# Patient Record
Sex: Male | Born: 1950 | Race: Black or African American | Hispanic: No | Smoking: Current some day smoker
Health system: Southern US, Community
[De-identification: ages and names within clinical notes are randomized; demographics above are authoritative.]

## PROBLEM LIST (undated history)

## (undated) DIAGNOSIS — E079 Disorder of thyroid, unspecified: Secondary | ICD-10-CM

## (undated) DIAGNOSIS — F329 Major depressive disorder, single episode, unspecified: Secondary | ICD-10-CM

## (undated) DIAGNOSIS — R200 Anesthesia of skin: Secondary | ICD-10-CM

## (undated) DIAGNOSIS — M549 Dorsalgia, unspecified: Secondary | ICD-10-CM

## (undated) DIAGNOSIS — D171 Benign lipomatous neoplasm of skin and subcutaneous tissue of trunk: Secondary | ICD-10-CM

## (undated) DIAGNOSIS — C801 Malignant (primary) neoplasm, unspecified: Secondary | ICD-10-CM

## (undated) DIAGNOSIS — M1990 Unspecified osteoarthritis, unspecified site: Secondary | ICD-10-CM

## (undated) DIAGNOSIS — J189 Pneumonia, unspecified organism: Secondary | ICD-10-CM

## (undated) DIAGNOSIS — F32A Depression, unspecified: Secondary | ICD-10-CM

## (undated) DIAGNOSIS — R202 Paresthesia of skin: Secondary | ICD-10-CM

## (undated) DIAGNOSIS — J45909 Unspecified asthma, uncomplicated: Secondary | ICD-10-CM

## (undated) DIAGNOSIS — G4700 Insomnia, unspecified: Secondary | ICD-10-CM

## (undated) DIAGNOSIS — R1310 Dysphagia, unspecified: Secondary | ICD-10-CM

## (undated) DIAGNOSIS — I493 Ventricular premature depolarization: Secondary | ICD-10-CM

## (undated) DIAGNOSIS — K219 Gastro-esophageal reflux disease without esophagitis: Secondary | ICD-10-CM

## (undated) DIAGNOSIS — F419 Anxiety disorder, unspecified: Secondary | ICD-10-CM

## (undated) HISTORY — PX: FOOT SURGERY: SHX648

## (undated) HISTORY — DX: Ventricular premature depolarization: I49.3

## (undated) HISTORY — DX: Unspecified asthma, uncomplicated: J45.909

## (undated) HISTORY — DX: Benign lipomatous neoplasm of skin and subcutaneous tissue of trunk: D17.1

## (undated) HISTORY — DX: Unspecified osteoarthritis, unspecified site: M19.90

## (undated) HISTORY — DX: Disorder of thyroid, unspecified: E07.9

## (undated) HISTORY — DX: Dysphagia, unspecified: R13.10

## (undated) HISTORY — DX: Gastro-esophageal reflux disease without esophagitis: K21.9

---

## 1976-05-01 HISTORY — PX: BUNIONECTOMY: SHX129

## 1976-05-01 HISTORY — PX: OTHER SURGICAL HISTORY: SHX169

## 1988-12-30 HISTORY — PX: OTHER SURGICAL HISTORY: SHX169

## 1994-05-01 HISTORY — PX: KNEE SURGERY: SHX244

## 1998-05-01 HISTORY — PX: BACK SURGERY: SHX140

## 2009-09-03 ENCOUNTER — Encounter (INDEPENDENT_AMBULATORY_CARE_PROVIDER_SITE_OTHER): Payer: Self-pay | Admitting: Family Medicine

## 2009-09-03 ENCOUNTER — Ambulatory Visit: Payer: Self-pay | Admitting: Internal Medicine

## 2009-09-03 LAB — CONVERTED CEMR LAB
Basophils Absolute: 0 10*3/uL (ref 0.0–0.1)
Eosinophils Absolute: 0.1 10*3/uL (ref 0.0–0.7)
Hemoglobin: 16.2 g/dL (ref 13.0–17.0)
Lymphocytes Relative: 35 % (ref 12–46)
MCHC: 33.7 g/dL (ref 30.0–36.0)
MCV: 86 fL (ref 78.0–100.0)
Monocytes Absolute: 0.4 10*3/uL (ref 0.1–1.0)
Monocytes Relative: 6 % (ref 3–12)
Neutrophils Relative %: 57 % (ref 43–77)
Platelets: 261 10*3/uL (ref 150–400)
RBC: 5.59 M/uL (ref 4.22–5.81)
Retic Ct Pct: 0.6 % (ref 0.4–3.1)

## 2009-09-04 ENCOUNTER — Encounter (INDEPENDENT_AMBULATORY_CARE_PROVIDER_SITE_OTHER): Payer: Self-pay | Admitting: Family Medicine

## 2009-09-08 ENCOUNTER — Ambulatory Visit (HOSPITAL_COMMUNITY): Admission: RE | Admit: 2009-09-08 | Discharge: 2009-09-08 | Payer: Self-pay | Admitting: Family Medicine

## 2009-09-17 ENCOUNTER — Ambulatory Visit: Payer: Self-pay | Admitting: Internal Medicine

## 2009-09-24 ENCOUNTER — Ambulatory Visit: Payer: Self-pay | Admitting: Internal Medicine

## 2009-10-14 ENCOUNTER — Encounter (INDEPENDENT_AMBULATORY_CARE_PROVIDER_SITE_OTHER): Payer: Self-pay | Admitting: Family Medicine

## 2009-10-14 ENCOUNTER — Ambulatory Visit: Payer: Self-pay | Admitting: Internal Medicine

## 2009-10-14 LAB — CONVERTED CEMR LAB: TSH: 0.187 microintl units/mL — ABNORMAL LOW (ref 0.350–4.500)

## 2009-10-15 ENCOUNTER — Encounter (INDEPENDENT_AMBULATORY_CARE_PROVIDER_SITE_OTHER): Payer: Self-pay | Admitting: Family Medicine

## 2009-10-22 ENCOUNTER — Ambulatory Visit (HOSPITAL_COMMUNITY): Admission: RE | Admit: 2009-10-22 | Discharge: 2009-10-22 | Payer: Self-pay | Admitting: Family Medicine

## 2009-10-22 ENCOUNTER — Ambulatory Visit: Payer: Self-pay | Admitting: Internal Medicine

## 2009-11-02 ENCOUNTER — Ambulatory Visit (HOSPITAL_COMMUNITY): Admission: RE | Admit: 2009-11-02 | Discharge: 2009-11-02 | Payer: Self-pay | Admitting: Family Medicine

## 2009-11-10 ENCOUNTER — Ambulatory Visit (HOSPITAL_COMMUNITY): Admission: RE | Admit: 2009-11-10 | Discharge: 2009-11-10 | Payer: Self-pay | Admitting: Cardiology

## 2009-11-10 ENCOUNTER — Encounter (INDEPENDENT_AMBULATORY_CARE_PROVIDER_SITE_OTHER): Payer: Self-pay | Admitting: Cardiology

## 2009-11-19 ENCOUNTER — Ambulatory Visit: Payer: Self-pay | Admitting: Internal Medicine

## 2009-12-14 ENCOUNTER — Emergency Department (HOSPITAL_COMMUNITY): Admission: EM | Admit: 2009-12-14 | Discharge: 2009-12-14 | Payer: Self-pay | Admitting: Emergency Medicine

## 2009-12-15 ENCOUNTER — Ambulatory Visit: Payer: Self-pay | Admitting: Internal Medicine

## 2010-01-17 ENCOUNTER — Encounter (INDEPENDENT_AMBULATORY_CARE_PROVIDER_SITE_OTHER): Payer: Self-pay | Admitting: Family Medicine

## 2010-01-17 LAB — CONVERTED CEMR LAB
PSA: 1.5 ng/mL (ref 0.10–4.00)
TSH: 0.709 microintl units/mL (ref 0.350–4.500)

## 2010-02-28 ENCOUNTER — Ambulatory Visit: Payer: Self-pay | Admitting: Internal Medicine

## 2010-05-10 ENCOUNTER — Encounter (INDEPENDENT_AMBULATORY_CARE_PROVIDER_SITE_OTHER): Payer: Self-pay | Admitting: *Deleted

## 2010-05-10 LAB — CONVERTED CEMR LAB: T3 Uptake Ratio: 29.5 % (ref 22.5–37.0)

## 2010-05-22 ENCOUNTER — Encounter: Payer: Self-pay | Admitting: Family Medicine

## 2010-07-15 LAB — DIFFERENTIAL
Basophils Absolute: 0 10*3/uL (ref 0.0–0.1)
Basophils Relative: 0 % (ref 0–1)
Eosinophils Absolute: 0.1 10*3/uL (ref 0.0–0.7)
Eosinophils Relative: 1 % (ref 0–5)
Lymphs Abs: 1.6 10*3/uL (ref 0.7–4.0)
Monocytes Absolute: 0.6 10*3/uL (ref 0.1–1.0)
Monocytes Relative: 10 % (ref 3–12)
Neutro Abs: 3.6 10*3/uL (ref 1.7–7.7)
Neutrophils Relative %: 61 % (ref 43–77)

## 2010-07-15 LAB — CBC
MCH: 29 pg (ref 26.0–34.0)
MCHC: 33.6 g/dL (ref 30.0–36.0)
Platelets: 227 10*3/uL (ref 150–400)
RDW: 14.3 % (ref 11.5–15.5)

## 2010-07-15 LAB — COMPREHENSIVE METABOLIC PANEL
Alkaline Phosphatase: 79 U/L (ref 39–117)
BUN: 11 mg/dL (ref 6–23)
Creatinine, Ser: 0.87 mg/dL (ref 0.4–1.5)
GFR calc Af Amer: >60 mL/min (ref >60)
GFR calc non Af Amer: >60 mL/min (ref >60)
Potassium: 3.5 mEq/L (ref 3.5–5.1)
Sodium: 137 mEq/L (ref 135–145)
Total Bilirubin: 0.5 mg/dL (ref 0.3–1.2)

## 2010-07-15 LAB — PROTIME-INR: Prothrombin Time: 14.1 seconds (ref 11.6–15.2)

## 2011-05-02 HISTORY — PX: TOE SURGERY: SHX1073

## 2011-09-05 ENCOUNTER — Encounter (HOSPITAL_COMMUNITY): Payer: Self-pay | Admitting: *Deleted

## 2011-09-05 ENCOUNTER — Emergency Department (HOSPITAL_COMMUNITY)
Admission: EM | Admit: 2011-09-05 | Discharge: 2011-09-05 | Disposition: A | Discharge status: Home / Self Care | Payer: Medicaid Other | Attending: Emergency Medicine | Admitting: Emergency Medicine

## 2011-09-05 DIAGNOSIS — R112 Nausea with vomiting, unspecified: Secondary | ICD-10-CM | POA: Insufficient documentation

## 2011-09-05 DIAGNOSIS — R109 Unspecified abdominal pain: Secondary | ICD-10-CM | POA: Insufficient documentation

## 2011-09-05 DIAGNOSIS — T507X5A Adverse effect of analeptics and opioid receptor antagonists, initial encounter: Secondary | ICD-10-CM | POA: Insufficient documentation

## 2011-09-05 DIAGNOSIS — T887XXA Unspecified adverse effect of drug or medicament, initial encounter: Secondary | ICD-10-CM

## 2011-09-05 LAB — URINALYSIS, ROUTINE W REFLEX MICROSCOPIC
Hgb urine dipstick: NEGATIVE
Ketones, ur: NEGATIVE mg/dL
Leukocytes, UA: NEGATIVE
Protein, ur: NEGATIVE mg/dL
Urobilinogen, UA: 0.2 mg/dL (ref 0.0–1.0)

## 2011-09-05 LAB — DIFFERENTIAL
Basophils Relative: 0 % (ref 0–1)
Eosinophils Relative: 0 % (ref 0–5)
Lymphs Abs: 1.9 10*3/uL (ref 0.7–4.0)
Neutro Abs: 8.7 10*3/uL — ABNORMAL HIGH (ref 1.7–7.7)
Neutrophils Relative %: 76 % (ref 43–77)

## 2011-09-05 LAB — BASIC METABOLIC PANEL
CO2: 26 mEq/L (ref 19–32)
Calcium: 9.1 mg/dL (ref 8.4–10.5)
Chloride: 101 mEq/L (ref 96–112)
Creatinine, Ser: 0.82 mg/dL (ref 0.50–1.35)
Potassium: 3.7 mEq/L (ref 3.5–5.1)

## 2011-09-05 LAB — CBC
HCT: 47.2 % (ref 39.0–52.0)
RBC: 5.37 MIL/uL (ref 4.22–5.81)
RDW: 15.1 % (ref 11.5–15.5)

## 2011-09-05 MED ORDER — ONDANSETRON 8 MG PO TBDP
ORAL_TABLET | ORAL | Status: AC
  Filled 2011-09-05: qty 1

## 2011-09-05 MED ORDER — ONDANSETRON 8 MG PO TBDP
8.0000 mg | ORAL_TABLET | Freq: Once | ORAL | Status: AC
  Administered 2011-09-05: 8 mg via ORAL

## 2011-09-05 MED ORDER — OXYCODONE-ACETAMINOPHEN 5-325 MG PO TABS: 1.0000 | ORAL_TABLET | ORAL | Status: AC | PRN

## 2011-09-05 NOTE — Discharge Instructions (Signed)
STOP TAKING SUBOXONE UNTIL SEEN BY YOUR DOCTOR. CALL HIS/HER OFFICE THIS MORNING TO SCHEDULE A TIME TO BE SEEN. TAKE PERCOCET IF NEEDED FOR BACK PAIN. IF ABDOMINAL PAIN WORSENS, YOU DEVELOP A FEVER, HAVE UNCONTROLLED VOMITING OR NEW CONCERN, RETURN HERE FOR FURTHER EVALUATION.   Abdominal Pain Abdominal pain can be caused by many things. Your caregiver decides the seriousness of your pain by an examination and possibly blood tests and X-rays. Many cases can be observed and treated at home. Most abdominal pain is not caused by a disease and will probably improve without treatment. However, in many cases, more time must pass before a clear cause of the pain can be found. Before that point, it may not be known if you need more testing, or if hospitalization or surgery is needed. HOME CARE INSTRUCTIONS   Do not take laxatives unless directed by your caregiver.   Take pain medicine only as directed by your caregiver.   Only take over-the-counter or prescription medicines for pain, discomfort, or fever as directed by your caregiver.   Try a clear liquid diet (broth, tea, or water) for as long as directed by your caregiver. Slowly move to a bland diet as tolerated.  SEEK IMMEDIATE MEDICAL CARE IF:   The pain does not go away.   You have a fever.   You keep throwing up (vomiting).   The pain is felt only in portions of the abdomen. Pain in the right side could possibly be appendicitis. In an adult, pain in the left lower portion of the abdomen could be colitis or diverticulitis.   You pass bloody or black tarry stools.  MAKE SURE YOU:   Understand these instructions.   Will watch your condition.   Will get help right away if you are not doing well or get worse.  Document Released: 01/25/2005 Document Revised: 04/06/2011 Document Reviewed: 12/04/2007 [facility] Patient Information 2012 [place], [place].

## 2011-09-05 NOTE — ED Provider Notes (Signed)
Medical screening examination/treatment/procedure(s) were performed by non-physician practitioner and as supervising physician I was immediately available for consultation/collaboration.  Flint Melter, MD 09/05/11 Ebony Cargo

## 2011-09-05 NOTE — ED Notes (Signed)
Pt notified that we need a urine sample

## 2011-09-05 NOTE — ED Notes (Signed)
UJW:[id number]<YN> Expected date:09/05/11<BR> Expected time:<BR> Means of arrival:Ambulance<BR> Comments:<BR> Vomiting

## 2011-09-05 NOTE — ED Notes (Signed)
Per EMS:  Pt has chronic back pain and has been tramadol for 2 years and was taken off recently and put on suboxone.  Pt st's he took suboxone around 0000 and started vomiting around 1.5 hours ago off and on, pt was dry heaving when EMS arrived.  Pt st's on the way here he no longer felt nauseous and could feel the effect of the drug (suboxone).  Pt has no other complaints, alert and oriented.  Pt ambulatory on scene.

## 2011-09-05 NOTE — ED Provider Notes (Signed)
History     CSN: [id number]  Arrival date & time 09/05/11  0536   First MD Initiated Contact with Patient 09/05/11 [phone]      Chief Complaint  Patient presents with  . Emesis    (Consider location/radiation/quality/duration/timing/severity/associated sxs/prior treatment) Patient is a 61 y.o. male presenting with vomiting. The history is provided by the patient.  Emesis  This is a new problem. The current episode started 6 to 12 hours ago. The problem occurs 2 to 4 times per day. The problem has been gradually improving. There has been no fever. Associated symptoms include abdominal pain. Pertinent negatives include no chills, no diarrhea and no fever. Associated symptoms comments: He started a new medication last night and began vomiting shortly after taking suboxone. He had been treated for chronic back pain with tramadol that was no longer providing relief and is managed through the Pain Management clinic. He is complaining also of right sided abdominal pain that is different from his usual pain. Marland Kitchen    History reviewed. No pertinent past medical history.  Past Surgical History  Procedure Date  . Back surgery     No family history on file.  History  Substance Use Topics  . Smoking status: Not on file  . Smokeless tobacco: Not on file  . Alcohol Use:       Review of Systems  Constitutional: Negative for fever and chills.  HENT: Negative.   Respiratory: Negative.  Negative for shortness of breath.   Cardiovascular: Negative.  Negative for chest pain.  Gastrointestinal: Positive for nausea, vomiting and abdominal pain. Negative for diarrhea.  Musculoskeletal: Negative for back pain.  Skin: Negative.   Neurological: Negative.     Allergies  Vancomycin  Home Medications   Current Outpatient Rx  Name Route Sig Dispense Refill  . BUPRENORPHINE HCL-NALOXONE HCL 8-2 MG SL SUBL Sublingual Place 1 tablet under the tongue daily.    Marland Kitchen CITALOPRAM HYDROBROMIDE 20 MG PO TABS  Oral Take 20 mg by mouth daily.    Marland Kitchen ESOMEPRAZOLE MAGNESIUM 40 MG PO CPDR Oral Take 40 mg by mouth daily before breakfast.    . NAPROXEN 500 MG PO TABS Oral Take 500 mg by mouth 2 (two) times daily with a meal.    . SEROQUEL PO Oral Take by mouth at bedtime.      BP 135/85  Pulse 69  Temp(Src) 98.7 F (37.1 C) (Oral)  Resp 18  SpO2 96%  Physical Exam  Constitutional: He appears well-developed and well-nourished.  HENT:  Head: Normocephalic.  Neck: Normal range of motion. Neck supple.  Cardiovascular: Normal rate and regular rhythm.   Pulmonary/Chest: Effort normal and breath sounds normal.  Abdominal: Soft. Bowel sounds are normal. There is no rebound and no guarding.       Right mid and right lower abdominal tenderness without rebound or guarding.   Musculoskeletal: Normal range of motion.  Neurological: He is alert. No cranial nerve deficit.  Skin: Skin is warm and dry. No rash noted.  Psychiatric: He has a normal mood and affect.    ED Course  Procedures (including critical care time)  Labs Reviewed  CBC - Abnormal; Notable for the following:    WBC 11.3 (*)    All other components within normal limits  DIFFERENTIAL - Abnormal; Notable for the following:    Neutro Abs 8.7 (*)    All other components within normal limits  BASIC METABOLIC PANEL - Abnormal; Notable for the following:  Glucose, Bld 131 (*)    BUN 26 (*)    All other components within normal limits  URINALYSIS, ROUTINE W REFLEX MICROSCOPIC   No results found. Results for orders placed during the hospital encounter of 09/05/11  CBC      Component Value Range   WBC 11.3 (*) 4.0 - 10.5 (K/uL)   RBC 5.37  4.22 - 5.81 (MIL/uL)   Hemoglobin 15.9  13.0 - 17.0 (g/dL)   HCT 16.1  09.6 - 04.5 (%)   MCV 87.9  78.0 - 100.0 (fL)   MCH 29.6  26.0 - 34.0 (pg)   MCHC 33.7  30.0 - 36.0 (g/dL)   RDW 40.9  81.1 - 91.4 (%)   Platelets 214  150 - 400 (K/uL)  DIFFERENTIAL      Component Value Range   Neutrophils  Relative 76  43 - 77 (%)   Neutro Abs 8.7 (*) 1.7 - 7.7 (K/uL)   Lymphocytes Relative 16  12 - 46 (%)   Lymphs Abs 1.9  0.7 - 4.0 (K/uL)   Monocytes Relative 7  3 - 12 (%)   Monocytes Absolute 0.8  0.1 - 1.0 (K/uL)   Eosinophils Relative 0  0 - 5 (%)   Eosinophils Absolute 0.0  0.0 - 0.7 (K/uL)   Basophils Relative 0  0 - 1 (%)   Basophils Absolute 0.0  0.0 - 0.1 (K/uL)  BASIC METABOLIC PANEL      Component Value Range   Sodium 137  135 - 145 (mEq/L)   Potassium 3.7  3.5 - 5.1 (mEq/L)   Chloride 101  96 - 112 (mEq/L)   CO2 26  19 - 32 (mEq/L)   Glucose, Bld 131 (*) 70 - 99 (mg/dL)   BUN 26 (*) 6 - 23 (mg/dL)   Creatinine, Ser 7.82  0.50 - 1.35 (mg/dL)   Calcium 9.1  8.4 - 95.6 (mg/dL)   GFR calc non Af Amer >90  >90 (mL/min)   GFR calc Af Amer >90  >90 (mL/min)  URINALYSIS, ROUTINE W REFLEX MICROSCOPIC      Component Value Range   Color, Urine YELLOW  YELLOW    APPearance CLEAR  CLEAR    Specific Gravity, Urine 1.027  1.005 - 1.030    pH 6.0  5.0 - 8.0    Glucose, UA NEGATIVE  NEGATIVE (mg/dL)   Hgb urine dipstick NEGATIVE  NEGATIVE    Bilirubin Urine NEGATIVE  NEGATIVE    Ketones, ur NEGATIVE  NEGATIVE (mg/dL)   Protein, ur NEGATIVE  NEGATIVE (mg/dL)   Urobilinogen, UA 0.2  0.0 - 1.0 (mg/dL)   Nitrite NEGATIVE  NEGATIVE    Leukocytes, UA NEGATIVE  NEGATIVE      No diagnosis found. 1. Adverse medication reaction - vomiting 2. Abdominal pain    MDM  Re-evaluation of abdomen: He is still mildly tender to right abdomen. No further vomiting. No fever, VSS. Minimal elevation of WBC without shift. Doubt this is appendicitis as vomiting seems to be related to suboxone, patient has a persistently good appetite, no fever. Discomfort on exam is not changing. Feel he can be discharged home and recheck with any worsening symptoms in 12-24 hours. Discussed with Dr. Read Drivers who agrees with care plan.        Rodena Medin, PA-C 09/05/11 [phone]

## 2011-10-16 ENCOUNTER — Ambulatory Visit: Payer: Medicaid Other | Attending: Anesthesiology

## 2011-10-16 DIAGNOSIS — M6281 Muscle weakness (generalized): Secondary | ICD-10-CM | POA: Insufficient documentation

## 2011-10-16 DIAGNOSIS — M2560 Stiffness of unspecified joint, not elsewhere classified: Secondary | ICD-10-CM | POA: Insufficient documentation

## 2011-10-16 DIAGNOSIS — IMO0001 Reserved for inherently not codable concepts without codable children: Secondary | ICD-10-CM | POA: Insufficient documentation

## 2011-10-16 DIAGNOSIS — R5381 Other malaise: Secondary | ICD-10-CM | POA: Insufficient documentation

## 2011-10-16 DIAGNOSIS — M2550 Pain in unspecified joint: Secondary | ICD-10-CM | POA: Insufficient documentation

## 2011-10-25 ENCOUNTER — Ambulatory Visit (INDEPENDENT_AMBULATORY_CARE_PROVIDER_SITE_OTHER): Payer: Medicaid Other | Admitting: General Surgery

## 2011-10-26 ENCOUNTER — Telehealth (INDEPENDENT_AMBULATORY_CARE_PROVIDER_SITE_OTHER): Payer: Self-pay | Admitting: General Surgery

## 2011-10-26 ENCOUNTER — Ambulatory Visit (INDEPENDENT_AMBULATORY_CARE_PROVIDER_SITE_OTHER): Payer: Medicaid Other | Admitting: General Surgery

## 2011-10-26 ENCOUNTER — Encounter (INDEPENDENT_AMBULATORY_CARE_PROVIDER_SITE_OTHER): Payer: Self-pay | Admitting: General Surgery

## 2011-10-26 VITALS — BP 126/78 | HR 70 | Temp 97.4°F | Resp 16 | Ht 75.0 in | Wt 166.0 lb

## 2011-10-26 DIAGNOSIS — E049 Nontoxic goiter, unspecified: Secondary | ICD-10-CM

## 2011-10-26 DIAGNOSIS — E042 Nontoxic multinodular goiter: Secondary | ICD-10-CM

## 2011-10-26 NOTE — Progress Notes (Signed)
Patient ID: Edgar Ross, male   DOB: [birth year], 61 y.o.   MRN: [id number]  Chief Complaint  Patient presents with  . Goiter    Neck    HPI Edgar Ross is a 61 y.o. male.  He is referred by Dr. Melven Sartorius at [facility] urgent care for evaluation of a thyroid goiter and multiple thyroid nodules.  This patient is disabled because of back pain and foot problems. He is on Medicaid and his care was transferred from [facility] to [facility] about 2 years ago.  The patient states that he has noted a swelling in his neck for over 2 years. He states that it is larger now. There is no pain or voice change. He had one episode where he choked on some pork chop, require Heimlich maneuver and has had no more dysphagia since that time. He is being careful to chew his food better.  On Sep 08, 2009 [facility] at [facility] r a thyroid ultrasound which showed an enlarged thyroid and bilateral cystic and solid nodules. He had 3 nodules on the right, largest 4.6 cm, the next largest was 3.6 cm and the smaller one was 2.9 cm. The largest nodule in the left was 1.2 cm. Ultrasound guided biopsy of the right upper pole nodule showed hyperplastic nodule. Nothing further was done.  He recently was evaluated by Dr. Melven Sartorius at [facility] urgent care. Lab work was drawn and we are obtaining those results. No further tests were done. He was referred for surgical evaluation because of his significant goiter.  [name] only complaints at this time or that in order is getting larger and is very noticeable and that there is some vague pressure symptoms.  Family history is negative for multiple endocrine neoplasia. Mother wirh Bone cancer. Father with cancer, type unknown.  Patient's past history is significant for a current smoking, and anxiety disorder, and mild asthma.   Labs 08/30/2011:   TSH 0.714              (range 0.450-4.500)                             T4   12.1                (range  4.5-12.0)                             T3 uptake  26%     (range 24-39)                             FTI  3.1                  (range 1.2-4.9)    HPI  Past Medical History  Diagnosis Date  . Arthritis   . Asthma   . GERD (gastroesophageal reflux disease)   . Thyroid disease   . Cough   . Visual disturbance   . Trouble swallowing   . Weakness     Past Surgical History  Procedure Date  . Back surgery 2000    laminectomy  . Bunionectomy 2013  . Knee surgery 1996    arthroscopic  . Bone spur removal 1978    Family History  Problem Relation Age of Onset  . Cancer Mother     bone  . Cancer Father  Social History History  Substance Use Topics  . Smoking status: Current Everyday Smoker -- 1.0 packs/day    Types: Cigarettes  . Smokeless tobacco: Never Used  . Alcohol Use: No    Allergies  Allergen Reactions  . Suboxone (Buprenorphine Hcl-Naloxone Hcl) Nausea And Vomiting  . Vancomycin Hives    Only where applied.    Current Outpatient Prescriptions  Medication Sig Dispense Refill  . HYDROcodone-acetaminophen (NORCO) 5-325 MG per tablet Take 1 tablet by mouth 3 (three) times daily.      . citalopram (CELEXA) 20 MG tablet Take 20 mg by mouth daily.      Marland Kitchen esomeprazole (NEXIUM) 40 MG capsule Take 40 mg by mouth daily before breakfast.      . naproxen (NAPROSYN) 500 MG tablet Take 500 mg by mouth 2 (two) times daily with a meal.        Review of Systems Review of Systems  Constitutional: Negative for fever, chills and unexpected weight change.  HENT: Positive for trouble swallowing. Negative for hearing loss, congestion, sore throat, drooling and voice change.   Eyes: Negative for visual disturbance.  Respiratory: Negative for cough and wheezing.   Cardiovascular: Negative for chest pain, palpitations and leg swelling.  Gastrointestinal: Negative for nausea, vomiting, abdominal pain, diarrhea, constipation, blood in stool, abdominal distention, anal bleeding  and rectal pain.  Genitourinary: Negative for hematuria and difficulty urinating.  Musculoskeletal: Positive for back pain and arthralgias.  Skin: Negative for rash and wound.  Neurological: Negative for seizures, syncope, weakness and headaches.  Hematological: Negative for adenopathy. Does not bruise/bleed easily.  Psychiatric/Behavioral: Negative for confusion. The patient is nervous/anxious.     Blood pressure 126/78, pulse 70, temperature 97.4 F (36.3 C), temperature source Temporal, resp. rate 16, height 6\' 3"  (1.905 m), weight 166 lb (75.297 kg).  Physical Exam Physical Exam  Constitutional: He is oriented to person, place, and time. He appears well-developed and well-nourished. No distress.       Thin.  HENT:  Head: Normocephalic.  Nose: Nose normal.  Mouth/Throat: No oropharyngeal exudate.  Eyes: Conjunctivae and EOM are normal. Pupils are equal, round, and reactive to light. Right eye exhibits no discharge. Left eye exhibits no discharge. No scleral icterus.  Neck: Normal range of motion. Neck supple. No JVD present. No tracheal deviation present. No thyromegaly present.       Significant bilateral thyroid gland enlargement. Right greater than left. Fairly smooth and soft. No discrete nodules. No adenopathy. Voice OK.  Cardiovascular: Normal rate, regular rhythm, normal heart sounds and intact distal pulses.   No murmur heard. Pulmonary/Chest: Effort normal and breath sounds normal. No stridor. No respiratory distress. He has no wheezes. He has no rales. He exhibits no tenderness.  Abdominal: Soft. Bowel sounds are normal. He exhibits no distension and no mass. There is no tenderness. There is no rebound and no guarding.  Musculoskeletal: Normal range of motion. He exhibits no edema and no tenderness.  Lymphadenopathy:    He has no cervical adenopathy.  Neurological: He is alert and oriented to person, place, and time. He has normal reflexes. Coordination normal.  Skin: Skin  is warm and dry. No rash noted. He is not diaphoretic. No erythema. No pallor.  Psychiatric: He has a normal mood and affect. His behavior is normal. Judgment and thought content normal.    Data Reviewed I reviewed the physician notes from [facility] urgent care. I reviewed his ultrasound from May of 2011. I reviewed cytopathology report from  2011. I have requested his blood tests. He will be referred to an endocrinologist for their opinion and we'll we will repeat his thyroid ultrasound.  Assessment    Significant bilateral goiter with pressure symptoms. Progressive historically.  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  Euthyroid state.  Current tobacco use  Anxiety disorder  Mild asthma, on no current medications    Plan    We had a long discussion about thyroid goiter and nodules. He was given patient information booklet. I told him that he probably will benefit from elective thyroidectomy.  Prior to consideration of surgery I am going to refer him to endocrinology for their evaluation regarding etiology and medical management. Since he is euthyroid I suspect that surgery will be required at some point.  We will repeat his thyroid ultrasound at this time. We will defer further needle biopsy of this unless there seems to be a reason from the ultrasound.  Return to see me in one month after the above evaluations are completed for further discussion and planning       Breyanna Valera M. Derrell Lolling, M.D., [facility] General and Minimally invasive Surgery Breast and Colorectal Surgery Office:   [phone] Pager:   [phone]  10/26/2011, 8:56 AM

## 2011-10-26 NOTE — Patient Instructions (Signed)
You have a thyroid goiter, which means that the entire thyroid gland is significantly enlarged.  You also have multiple thyroid nodules which were seen on the ultrasound x-ray that was done 2 years ago.  You may need an operation to remove this enlarged thyroid gland. I suspect that you will benefit from that.  Prior to recommending surgery, we are going to repeat a thyroid ultrasound, and we are also going to send you to an endocrinologist for their evaluation.  Please read the thyroid information booklet that Dr. Derrell Lolling gave you.  Return to see Dr. Derrell Lolling in one month after the above evaluations are completed.

## 2011-10-26 NOTE — Telephone Encounter (Signed)
[facility] to obtain a copy of the thyroid function test. It was not sent to [facility] for review before patient appointment. Information to be faxed.

## 2011-10-27 ENCOUNTER — Telehealth (INDEPENDENT_AMBULATORY_CARE_PROVIDER_SITE_OTHER): Payer: Self-pay | Admitting: General Surgery

## 2011-10-27 NOTE — Telephone Encounter (Signed)
Called patient and advised his insurance has required that his PCP has to make the referral to endocrinology. Patient gave permission for [facility] to be called at [phone]. Had to leave a message for the referral nurse advising of situation and to please call back to advise if she needs notes from Dr. Derrell Lolling, or if there is something that he needs to sign in order for the referral to be made. Also advised this patient will be having an ultrasound done (11/15/11 @ 12:45 at [facility]) and the results from this and the endocrinologist will be needed in order to be seen again by Dr. Derrell Lolling.

## 2011-10-30 ENCOUNTER — Telehealth (INDEPENDENT_AMBULATORY_CARE_PROVIDER_SITE_OTHER): Payer: Self-pay | Admitting: General Surgery

## 2011-10-30 ENCOUNTER — Ambulatory Visit: Payer: Medicaid Other | Attending: Anesthesiology

## 2011-10-30 DIAGNOSIS — IMO0001 Reserved for inherently not codable concepts without codable children: Secondary | ICD-10-CM | POA: Insufficient documentation

## 2011-10-30 DIAGNOSIS — M2560 Stiffness of unspecified joint, not elsewhere classified: Secondary | ICD-10-CM | POA: Insufficient documentation

## 2011-10-30 DIAGNOSIS — M2550 Pain in unspecified joint: Secondary | ICD-10-CM | POA: Insufficient documentation

## 2011-10-30 DIAGNOSIS — R5381 Other malaise: Secondary | ICD-10-CM | POA: Insufficient documentation

## 2011-10-30 DIAGNOSIS — M6281 Muscle weakness (generalized): Secondary | ICD-10-CM | POA: Insufficient documentation

## 2011-10-30 NOTE — Telephone Encounter (Signed)
Vernona Rieger with [facility] urgent care called back about patient's endocrinology referral. She needs to know if this is urgent or if the referral can wait until his PCP is back this Friday. She also needs to know if she is supposed to make the referral for the patient's ultrasound. Please call her back. [phone].

## 2011-10-31 ENCOUNTER — Telehealth (INDEPENDENT_AMBULATORY_CARE_PROVIDER_SITE_OTHER): Payer: Self-pay | Admitting: General Surgery

## 2011-10-31 NOTE — Telephone Encounter (Signed)
Called back Edgar Ross per her message 10/30/11. Confirmed she can submit request for referral on Friday when the PCP gets back into the office. Advised that the patient has already been set up for an ultrasound at [facility] on 11/15/11 and that Albin Felling was confirming if a pre-cert was required. Faxing Dr. Jacinto Halim progress notes from 10/26/11 visit to her attention at  [phone] for the referral requested. Advised the patient will be seen by Dr. Derrell Lolling after all the testing has been completed, and that Dr. Derrell Lolling would like to see the patient in one month if possible. However the appointment depends on when the endocrinologist can see the patient.

## 2011-11-06 ENCOUNTER — Ambulatory Visit: Payer: Medicaid Other

## 2011-11-13 ENCOUNTER — Ambulatory Visit: Payer: Medicaid Other

## 2011-11-15 ENCOUNTER — Ambulatory Visit (HOSPITAL_COMMUNITY)
Admission: RE | Admit: 2011-11-15 | Discharge: 2011-11-15 | Disposition: A | Discharge status: Home / Self Care | Payer: Medicaid Other | Source: Ambulatory Visit | Attending: General Surgery | Admitting: General Surgery

## 2011-11-15 DIAGNOSIS — E042 Nontoxic multinodular goiter: Secondary | ICD-10-CM | POA: Insufficient documentation

## 2011-11-15 DIAGNOSIS — E049 Nontoxic goiter, unspecified: Secondary | ICD-10-CM

## 2011-11-24 ENCOUNTER — Telehealth (INDEPENDENT_AMBULATORY_CARE_PROVIDER_SITE_OTHER): Payer: Self-pay | Admitting: General Surgery

## 2011-11-24 NOTE — Telephone Encounter (Signed)
Left message on patient home phone to advise of appointment on 11/27/11 at 5:15.

## 2011-11-27 ENCOUNTER — Encounter (INDEPENDENT_AMBULATORY_CARE_PROVIDER_SITE_OTHER): Payer: Medicaid Other | Admitting: General Surgery

## 2011-11-28 ENCOUNTER — Telehealth (INDEPENDENT_AMBULATORY_CARE_PROVIDER_SITE_OTHER): Payer: Self-pay | Admitting: General Surgery

## 2011-11-28 NOTE — Telephone Encounter (Signed)
Called patient who advised that he will be seeing endo this week on 11/30/11 at 12:00. Patient forgot about appointment on 11/27/11. He said he thought he was supposed to go to [place] [facility] first. Advised patient I will call him back with appointment with Dr. Derrell Lolling. Will need to contact Dr. Derrell Lolling first to confirm specific date before I give to patient. Would like to add him on to schedule.

## 2011-11-29 ENCOUNTER — Telehealth (INDEPENDENT_AMBULATORY_CARE_PROVIDER_SITE_OTHER): Payer: Self-pay | Admitting: General Surgery

## 2011-11-29 NOTE — Telephone Encounter (Signed)
Called patient to advise of appointment for follow up and discuss test results. Appointment set for 12/11/11 at 4:00 (patient added to the schedule per Dr. Derrell Lolling).

## 2011-12-07 ENCOUNTER — Telehealth (INDEPENDENT_AMBULATORY_CARE_PROVIDER_SITE_OTHER): Payer: Self-pay | Admitting: General Surgery

## 2011-12-07 NOTE — Telephone Encounter (Signed)
Called [facility] and spoke with Rosey Bath and requested a copy of the progress notes and any test results from patient visit on 11/30/11 at 12:00. She confirmed information will be faxed to either [phone] or [phone].

## 2011-12-08 ENCOUNTER — Encounter (INDEPENDENT_AMBULATORY_CARE_PROVIDER_SITE_OTHER): Payer: Self-pay

## 2011-12-11 ENCOUNTER — Encounter (INDEPENDENT_AMBULATORY_CARE_PROVIDER_SITE_OTHER): Payer: Self-pay | Admitting: General Surgery

## 2011-12-11 ENCOUNTER — Ambulatory Visit (INDEPENDENT_AMBULATORY_CARE_PROVIDER_SITE_OTHER): Payer: Medicaid Other | Admitting: General Surgery

## 2011-12-11 VITALS — BP 110/60 | HR 88 | Temp 97.6°F | Resp 16 | Ht 75.0 in | Wt 172.0 lb

## 2011-12-11 DIAGNOSIS — E042 Nontoxic multinodular goiter: Secondary | ICD-10-CM

## 2011-12-11 NOTE — Progress Notes (Signed)
Patient ID: Edgar Ross, male   DOB: Apr 22, 1951, 61 y.o.   MRN: [id number]  Chief Complaint  Patient presents with  . Follow-up    discuss results    HPI Edgar Ross is a 61 y.o. male.  He returns for further evaluation of his multinodular goiter.  The patient was initially evaluated by Dr. Melven Sartorius and referred to me because of a goiter and some pressure symptoms. He had an ultrasound in 2011 and a fine needle aspiration cytology which suggested benign nodules. He has had some minor choking and swallowing problems over the past 2 years but states this is not much of a problem. He says that the enlargement has been slowly progressing. Thyroid function tests are normal.  Repeat ultrasound on 11/15/2011 shows mild increase in size with multiple nodules. For example the largest nodule right side has gone from 4.6 cm to 5.2 cm.  He has seen Dr. Lucianne Muss at [facility] endocrinology. Dr. Lucianne Muss diagnosis is a multinodular thyroid and normal thyroid function. He did not seem inclined to offer him thyroid suppression, noting that suppression is usually unsuccessful in large multinodular goiters. Dr. Lucianne Muss noted the patient most likely will have a gradual increase in the size of the goiter and eventually will need surgery. He discussed management options with the patient.  The patient and I have had a long discussion today about the size of his goiter, his pressure symptoms, the low but   definite risk of well-differentiated malignancy in a 5.2 cm nodule, and the difficulty of surgery as the goiter enlarges and years go by.  He is not interested in surgery at this time and states he will return to see me when he is. HPI  Past Medical History  Diagnosis Date  . Arthritis   . Asthma   . GERD (gastroesophageal reflux disease)   . Thyroid disease   . Cough   . Visual disturbance   . Trouble swallowing   . Weakness     Past Surgical History  Procedure Date  . Back surgery 2000    laminectomy  .  Bunionectomy 2013  . Knee surgery 1996    arthroscopic  . Bone spur removal 1978    Family History  Problem Relation Age of Onset  . Cancer Mother     bone  . Cancer Father     Social History History  Substance Use Topics  . Smoking status: Current Everyday Smoker -- 1.0 packs/day    Types: Cigarettes  . Smokeless tobacco: Former Neurosurgeon  . Alcohol Use: No    Allergies  Allergen Reactions  . Suboxone (Buprenorphine Hcl-Naloxone Hcl) Nausea And Vomiting  . Vancomycin Hives    Only where applied.    Current Outpatient Prescriptions  Medication Sig Dispense Refill  . ARIPiprazole (ABILIFY) 5 MG tablet Take 5 mg by mouth daily.      . citalopram (CELEXA) 20 MG tablet Take 40 mg by mouth daily.       Marland Kitchen esomeprazole (NEXIUM) 40 MG capsule Take 40 mg by mouth daily before breakfast.      . HYDROcodone-acetaminophen (NORCO) 5-325 MG per tablet Take 1 tablet by mouth 3 (three) times daily.      . naproxen (NAPROSYN) 500 MG tablet Take 500 mg by mouth 2 (two) times daily with a meal.        Review of Systems Review of Systems  Constitutional: Negative for fever, chills and unexpected weight change.  HENT: Negative for hearing loss, congestion, sore throat,  trouble swallowing and voice change.   Eyes: Negative for visual disturbance.  Respiratory: Negative for cough and wheezing.   Cardiovascular: Negative for chest pain, palpitations and leg swelling.  Gastrointestinal: Negative for nausea, vomiting, abdominal pain, diarrhea, constipation, blood in stool, abdominal distention, anal bleeding and rectal pain.  Genitourinary: Negative for hematuria and difficulty urinating.  Musculoskeletal: Negative for arthralgias.  Skin: Negative for rash and wound.  Neurological: Negative for seizures, syncope, weakness and headaches.  Hematological: Negative for adenopathy. Does not bruise/bleed easily.  Psychiatric/Behavioral: Negative for confusion.    Blood pressure 110/60, pulse 88,  temperature 97.6 F (36.4 C), temperature source Temporal, resp. rate 16, height 6\' 3"  (1.905 m), weight 172 lb (78.019 kg).  Physical Exam Physical Exam  Constitutional: He appears well-developed and well-nourished. No distress.  HENT:  Head: Normocephalic and atraumatic.  Eyes: Conjunctivae and EOM are normal. Pupils are equal, round, and reactive to light.  Neck: Neck supple. No JVD present. No tracheal deviation present. Thyromegaly present.       Significantly enlarged right lobe of thyroid. Multiple soft nodules. No adenopathy. voice normal.  Cardiovascular: Normal rate and regular rhythm.   No murmur heard. Pulmonary/Chest: Effort normal and breath sounds normal. No stridor. No respiratory distress. He has no wheezes.  Lymphadenopathy:    He has no cervical adenopathy.  Skin: Skin is warm and dry. No rash noted. He is not diaphoretic. No erythema. No pallor.  Psychiatric: He has a normal mood and affect. His behavior is normal. Judgment and thought content normal.    Data Reviewed Ultrasound report. Dr. Remus Blake notes. My old notes.  Assessment    Multinodular goiter, progressive enlargement on right side with pressure symptoms.  Euthyroid state.    Plan    I had a 30 minute discussion with the patient about his diagnosis, the low but definite chance of malignancy, the high likelihood that this will progress and he will eventually need surgery, and I offered to proceed with total thyroidectomy at this time. We talked about these issues at length.  It is clear that he is not interested in surgery at this time. He states he will call me when he is ready to have surgery.   Angelia Mould. Derrell Lolling, M.D., [facility] General and Minimally invasive Surgery Breast and Colorectal Surgery Office:   [phone] Pager:   [phone]         12/11/2011, 4:46 PM

## 2011-12-11 NOTE — Patient Instructions (Signed)
You have a large multinodular goiter. Most likely this is benign, but one of the nodules is 5.2 cm so that raises the likelihood that there could be low-grade malignancy in this.  You  have seen Dr. Lucianne Muss, and he agrees that most likely this will slowly enlarge and you will eventually need surgery. I agree with his opinion.  The mild problems that you have  with choking and swallowing are possibly due to pressure from the thyroid gland on your trachea and your esophagus. I think that this will get worse as the thyroid gland enlarges.  Dr. Derrell Lolling has advised for you to undergo elective total thyroidectomy. This  is not an emergency.  You have stated that you do not want to do this at this time. You have stated that you will call Dr. Derrell Lolling when you're ready to have the surgery.

## 2011-12-14 ENCOUNTER — Encounter (INDEPENDENT_AMBULATORY_CARE_PROVIDER_SITE_OTHER): Payer: Self-pay

## 2012-06-27 ENCOUNTER — Ambulatory Visit: Payer: Medicaid Other | Attending: Anesthesiology

## 2012-06-27 DIAGNOSIS — IMO0001 Reserved for inherently not codable concepts without codable children: Secondary | ICD-10-CM | POA: Insufficient documentation

## 2012-06-27 DIAGNOSIS — M2550 Pain in unspecified joint: Secondary | ICD-10-CM | POA: Insufficient documentation

## 2012-06-27 DIAGNOSIS — R293 Abnormal posture: Secondary | ICD-10-CM | POA: Insufficient documentation

## 2012-07-01 ENCOUNTER — Encounter (INDEPENDENT_AMBULATORY_CARE_PROVIDER_SITE_OTHER): Payer: Self-pay | Admitting: General Surgery

## 2012-07-02 ENCOUNTER — Ambulatory Visit (INDEPENDENT_AMBULATORY_CARE_PROVIDER_SITE_OTHER): Payer: Medicaid Other | Admitting: General Surgery

## 2012-07-02 ENCOUNTER — Encounter (INDEPENDENT_AMBULATORY_CARE_PROVIDER_SITE_OTHER): Payer: Self-pay | Admitting: General Surgery

## 2012-07-02 VITALS — BP 130/88 | HR 64 | Temp 98.4°F | Resp 16 | Ht 75.0 in | Wt 173.2 lb

## 2012-07-02 DIAGNOSIS — R222 Localized swelling, mass and lump, trunk: Secondary | ICD-10-CM

## 2012-07-02 DIAGNOSIS — R2242 Localized swelling, mass and lump, left lower limb: Secondary | ICD-10-CM

## 2012-07-02 DIAGNOSIS — R229 Localized swelling, mass and lump, unspecified: Secondary | ICD-10-CM

## 2012-07-02 NOTE — Progress Notes (Signed)
Patient ID: Edgar Ross, male   DOB: December 18, 1950, 62 y.o.   MRN: [id number]  Chief Complaint  Patient presents with  . Lipoma    est pt new prob- eval ;lipoma ib rt thigh and left shoulder blade    HPI Travelle Ross is a 62 y.o. male.  He is referred back to me by Dr. Earlyne Iba for evaluation  and surgical management of a large soft tissue mass of the right upper back and a large soft tissue mass of the left thigh. These are presumably lipomas.  I have seen this patient before for a Benign multinodular thyroid goiter  which had been enlarging and causing some pressure symptoms. He has seen Dr. Lucianne Muss. He has declined surgery to date. He knows he will eventually need a thyroidectomy..  The mass on his right upper back was only noticed recently but he can see it in the mirror and it bothers him. The mass on his left thigh has been there for over one year. He can see it and it bothers him but there is minimal pain with both of these. He would like these areas excised.  Comorbidities include depression, GERD. Otherwise reasonably healthy.  HPI  Past Medical History  Diagnosis Date  . Arthritis   . Asthma   . GERD (gastroesophageal reflux disease)   . Thyroid disease   . Cough   . Visual disturbance   . Trouble swallowing   . Weakness   . Lipoma of back     Past Surgical History  Procedure Laterality Date  . Back surgery  2000    laminectomy  . Bunionectomy  2013  . Knee surgery  1996    arthroscopic  . Bone spur removal  1978    Family History  Problem Relation Age of Onset  . Cancer Mother     bone  . Cancer Father     unknown    Social History History  Substance Use Topics  . Smoking status: Current Every Day Smoker -- 1.00 packs/day    Types: Cigarettes  . Smokeless tobacco: Former Neurosurgeon  . Alcohol Use: No    Allergies  Allergen Reactions  . Suboxone (Buprenorphine Hcl-Naloxone Hcl) Nausea And Vomiting  . Vancomycin Hives    Only where applied.    Current  Outpatient Prescriptions  Medication Sig Dispense Refill  . ARIPiprazole (ABILIFY) 5 MG tablet Take 5 mg by mouth daily.      . Buprenorphine HCl-Naloxone HCl (SUBOXONE) 8-2 MG FILM Place under the tongue every 12 (twelve) hours.      . citalopram (CELEXA) 20 MG tablet Take 40 mg by mouth daily.       Marland Kitchen esomeprazole (NEXIUM) 40 MG capsule Take 40 mg by mouth daily before breakfast.      . HYDROcodone-acetaminophen (NORCO) 10-325 MG per tablet Take 1 tablet by mouth every 8 (eight) hours as needed for pain.      . naproxen (NAPROSYN) 500 MG tablet Take 500 mg by mouth 2 (two) times daily with a meal.      . terbinafine (LAMISIL) 250 MG tablet Take 250 mg by mouth daily.       No current facility-administered medications for this visit.    Review of Systems Review of Systems  Constitutional: Negative for fever, chills and unexpected weight change.  HENT: Positive for trouble swallowing. Negative for hearing loss, congestion, sore throat, neck pain and voice change.   Eyes: Negative for visual disturbance.  Respiratory: Negative for  cough and wheezing.   Cardiovascular: Negative for chest pain, palpitations and leg swelling.  Gastrointestinal: Negative for nausea, vomiting, abdominal pain, diarrhea, constipation, blood in stool, abdominal distention, anal bleeding and rectal pain.  Genitourinary: Negative for hematuria and difficulty urinating.  Musculoskeletal: Negative for arthralgias.  Skin: Negative for rash and wound.  Neurological: Negative for seizures, syncope, weakness and headaches.  Hematological: Negative for adenopathy. Does not bruise/bleed easily.  Psychiatric/Behavioral: Negative for confusion. The patient is nervous/anxious.     Blood pressure 130/88, pulse 64, temperature 98.4 F (36.9 C), temperature source Temporal, resp. rate 16, height 6\' 3"  (1.905 m), weight 173 lb 3.2 oz (78.563 kg).  Physical Exam Physical Exam  Constitutional: He is oriented to person, place,  and time. He appears well-developed and well-nourished. No distress.  HENT:  Head: Normocephalic.  Nose: Nose normal.  Mouth/Throat: No oropharyngeal exudate.  Eyes: Conjunctivae and EOM are normal. Pupils are equal, round, and reactive to light. Right eye exhibits no discharge. Left eye exhibits no discharge. No scleral icterus.  Neck: Normal range of motion. Neck supple. No JVD present. No tracheal deviation present. Thyromegaly present.  Thyroid goiter unchanged. Voice normal. No adenopathy  Cardiovascular: Normal rate, regular rhythm, normal heart sounds and intact distal pulses.   No murmur heard. Pulmonary/Chest: Effort normal and breath sounds normal. No stridor. No respiratory distress. He has no wheezes. He has no rales. He exhibits no tenderness.  Abdominal: Soft. Bowel sounds are normal. He exhibits no distension and no mass. There is no tenderness. There is no rebound and no guarding.  Musculoskeletal: Normal range of motion. He exhibits no edema and no tenderness.  Lymphadenopathy:    He has no cervical adenopathy.  Neurological: He is alert and oriented to person, place, and time. He has normal reflexes. Coordination normal.  Skin: Skin is warm and dry. No rash noted. He is not diaphoretic. No erythema. No pallor.  5 cm smooth soft tissue mass right upper back near scapula. 5 cm soft tissue mass left distal thigh and anterior, overlying quadriceps, well above the patella. Both had the texture of a lipoma.  Psychiatric: He has a normal mood and affect. His behavior is normal. Judgment and thought content normal.    Data Reviewed My old records. Office notes from referring physician.  Assessment    5 cm soft tissue mass right upper back, suspect lipoma  5 cm soft tissue mass left anterior thigh and, suspect lipoma  Benign multinodular goiter  Impression  GERD     Plan    We had a long discussion, and the patient wants both of these areas excised. I think that this  can be done as an outpatient but will require general anesthesia and a position change from supine to prone to get both done. I told him that we may or may not leave a drain. He understands and accepts all this.  I discussed the indications, details, techniques, and numerous risks of the surgery with him. He understands all these issues. All his questions were answered. He agrees with this plan.        Angelia Mould. Derrell Lolling, M.D., [facility] General and Minimally invasive Surgery Breast and Colorectal Surgery Office:   [phone] Pager:   [phone]  07/02/2012, 10:40 AM

## 2012-07-02 NOTE — Patient Instructions (Signed)
The large mass on your right upper back and the mass on your left anterior thigh are probably benign fatty tumors called  lipomas.  You will be scheduled for excision of both of these masses under general anesthesia in the near future.

## 2012-07-11 ENCOUNTER — Encounter (HOSPITAL_BASED_OUTPATIENT_CLINIC_OR_DEPARTMENT_OTHER): Payer: Self-pay | Admitting: *Deleted

## 2012-07-11 NOTE — Progress Notes (Signed)
Denies any ht or resp problems-hx asthma-states no problem now

## 2012-07-16 NOTE — H&P (Signed)
Edgar Ross    MRN:  [id number]   Description: 62 year old male  Provider: [name], MD  Department: [facility]      Diagnoses    Mass on back    -  Primary    782.2    Mass of thigh, left        782.2          Current Vitals    BP Pulse Temp(Src) Resp Ht Wt    130/88 64 98.4 F (36.9 C) (Temporal) 16 6\' 3"  (1.905 m) 173 lb 3.2 oz (78.563 kg)    BMI 21.65 kg/m2                 History and Physical    [name], MD    Status: Signed                         HPI Edgar Ross is a 62 y.o. male.  He is referred back to me by Dr. Earlyne Iba for evaluation  and surgical management of a large soft tissue mass of the right upper back and a large soft tissue mass of the left thigh. These are presumably lipomas.   I have seen this patient before for a Benign multinodular thyroid goiter  which had been enlarging and causing some pressure symptoms. He has seen Dr. Lucianne Muss. He has declined surgery to date. He knows he will eventually need a thyroidectomy..   The mass on his right upper back was only noticed recently but he can see it in the mirror and it bothers him. The mass on his left thigh has been there for over one year. He can see it and it bothers him but there is minimal pain with both of these. He would like these areas excised.   Comorbidities include depression, GERD. Otherwise reasonably healthy.         Past Medical History   Diagnosis  Date   .  Arthritis     .  Asthma     .  GERD (gastroesophageal reflux disease)     .  Thyroid disease     .  Cough     .  Visual disturbance     .  Trouble swallowing     .  Weakness     .  Lipoma of back           Past Surgical History   Procedure  Laterality  Date   .  Back surgery    2000       laminectomy   .  Bunionectomy    2013   .  Knee surgery    1996       arthroscopic   .  Bone spur removal    1978         Family History   Problem  Relation  Age of Onset   .   Cancer  Mother         bone   .  Cancer  Father         unknown        Social History History   Substance Use Topics   .  Smoking status:  Current Every Day Smoker -- 1.00 packs/day       Types:  Cigarettes   .  Smokeless tobacco:  Former Neurosurgeon   .  Alcohol Use:  No  Allergies   Allergen  Reactions   .  Suboxone (Buprenorphine Hcl-Naloxone Hcl)  Nausea And Vomiting   .  Vancomycin  Hives       Only where applied.         Current Outpatient Prescriptions   Medication  Sig  Dispense  Refill   .  ARIPiprazole (ABILIFY) 5 MG tablet  Take 5 mg by mouth daily.         .  Buprenorphine HCl-Naloxone HCl (SUBOXONE) 8-2 MG FILM  Place under the tongue every 12 (twelve) hours.         .  citalopram (CELEXA) 20 MG tablet  Take 40 mg by mouth daily.          Marland Kitchen  esomeprazole (NEXIUM) 40 MG capsule  Take 40 mg by mouth daily before breakfast.         .  HYDROcodone-acetaminophen (NORCO) 10-325 MG per tablet  Take 1 tablet by mouth every 8 (eight) hours as needed for pain.         .  naproxen (NAPROSYN) 500 MG tablet  Take 500 mg by mouth 2 (two) times daily with a meal.         .  terbinafine (LAMISIL) 250 MG tablet  Take 250 mg by mouth daily.               Review of Systems   Constitutional: Negative for fever, chills and unexpected weight change.  HENT: Positive for trouble swallowing. Negative for hearing loss, congestion, sore throat, neck pain and voice change.   Eyes: Negative for visual disturbance.  Respiratory: Negative for cough and wheezing.   Cardiovascular: Negative for chest pain, palpitations and leg swelling.  Gastrointestinal: Negative for nausea, vomiting, abdominal pain, diarrhea, constipation, blood in stool, abdominal distention, anal bleeding and rectal pain.  Genitourinary: Negative for hematuria and difficulty urinating.  Musculoskeletal: Negative for arthralgias.  Skin: Negative for rash and wound.  Neurological: Negative for seizures, syncope,  weakness and headaches.  Hematological: Negative for adenopathy. Does not bruise/bleed easily.  Psychiatric/Behavioral: Negative for confusion. The patient is nervous/anxious.       Blood pressure 130/88, pulse 64, temperature 98.4 F (36.9 C), temperature source Temporal, resp. rate 16, height 6\' 3"  (1.905 m), weight 173 lb 3.2 oz (78.563 kg).   Physical Exam   Constitutional: He is oriented to person, place, and time. He appears well-developed and well-nourished. No distress.  HENT:   Head: Normocephalic.   Nose: Nose normal.   Mouth/Throat: No oropharyngeal exudate.  Eyes: Conjunctivae and EOM are normal. Pupils are equal, round, and reactive to light. Right eye exhibits no discharge. Left eye exhibits no discharge. No scleral icterus.  Neck: Normal range of motion. Neck supple. No JVD present. No tracheal deviation present. Thyromegaly present.  Thyroid goiter unchanged. Voice normal. No adenopathy  Cardiovascular: Normal rate, regular rhythm, normal heart sounds and intact distal pulses.    No murmur heard. Pulmonary/Chest: Effort normal and breath sounds normal. No stridor. No respiratory distress. He has no wheezes. He has no rales. He exhibits no tenderness.  Abdominal: Soft. Bowel sounds are normal. He exhibits no distension and no mass. There is no tenderness. There is no rebound and no guarding.  Musculoskeletal: Normal range of motion. He exhibits no edema and no tenderness.  Lymphadenopathy:    He has no cervical adenopathy.  Neurological: He is alert and oriented to person, place, and time. He has normal reflexes. Coordination normal.  Skin: Skin  is warm and dry. No rash noted. He is not diaphoretic. No erythema. No pallor.  5 cm smooth soft tissue mass right upper back near scapula. 5 cm soft tissue mass left distal thigh and anterior, overlying quadriceps, well above the patella. Both had the texture of a lipoma.  Psychiatric: He has a normal mood and affect. His  behavior is normal. Judgment and thought content normal.      Data Reviewed My old records. Office notes from referring physician.   Assessment    5 cm soft tissue mass right upper back, suspect lipoma   5 cm soft tissue mass left anterior thigh and, suspect lipoma   Benign multinodular goiter   Impression   GERD      Plan    We had a long discussion, and the patient wants both of these areas excised. I think that this can be done as an outpatient but will require general anesthesia and a position change from supine to prone to get both done. I told him that we may or may not leave a drain. He understands and accepts all this.   I discussed the indications, details, techniques, and numerous risks of the surgery with him. He understands all these issues. All his questions were answered. He agrees with this plan.           Angelia Mould. Derrell Lolling, M.D., [facility] General and Minimally invasive Surgery Breast and Colorectal Surgery Office:   [phone] Pager:   [phone]

## 2012-07-17 ENCOUNTER — Ambulatory Visit (HOSPITAL_BASED_OUTPATIENT_CLINIC_OR_DEPARTMENT_OTHER): Payer: Medicaid Other | Admitting: Anesthesiology

## 2012-07-17 ENCOUNTER — Ambulatory Visit (HOSPITAL_BASED_OUTPATIENT_CLINIC_OR_DEPARTMENT_OTHER)
Admission: RE | Admit: 2012-07-17 | Discharge: 2012-07-17 | Disposition: A | Discharge status: Home / Self Care | Payer: Medicaid Other | Source: Ambulatory Visit | Attending: General Surgery | Admitting: General Surgery

## 2012-07-17 ENCOUNTER — Encounter (HOSPITAL_BASED_OUTPATIENT_CLINIC_OR_DEPARTMENT_OTHER): Payer: Self-pay | Admitting: *Deleted

## 2012-07-17 ENCOUNTER — Encounter (HOSPITAL_BASED_OUTPATIENT_CLINIC_OR_DEPARTMENT_OTHER): Payer: Self-pay | Admitting: Anesthesiology

## 2012-07-17 ENCOUNTER — Encounter (HOSPITAL_BASED_OUTPATIENT_CLINIC_OR_DEPARTMENT_OTHER)
Admission: RE | Disposition: A | Discharge status: Home / Self Care | Payer: Self-pay | Source: Ambulatory Visit | Attending: General Surgery

## 2012-07-17 DIAGNOSIS — Z808 Family history of malignant neoplasm of other organs or systems: Secondary | ICD-10-CM | POA: Insufficient documentation

## 2012-07-17 DIAGNOSIS — F17200 Nicotine dependence, unspecified, uncomplicated: Secondary | ICD-10-CM | POA: Insufficient documentation

## 2012-07-17 DIAGNOSIS — Z885 Allergy status to narcotic agent status: Secondary | ICD-10-CM | POA: Insufficient documentation

## 2012-07-17 DIAGNOSIS — D1739 Benign lipomatous neoplasm of skin and subcutaneous tissue of other sites: Secondary | ICD-10-CM

## 2012-07-17 DIAGNOSIS — Z79899 Other long term (current) drug therapy: Secondary | ICD-10-CM | POA: Insufficient documentation

## 2012-07-17 DIAGNOSIS — D1779 Benign lipomatous neoplasm of other sites: Secondary | ICD-10-CM | POA: Insufficient documentation

## 2012-07-17 DIAGNOSIS — F329 Major depressive disorder, single episode, unspecified: Secondary | ICD-10-CM | POA: Insufficient documentation

## 2012-07-17 DIAGNOSIS — E042 Nontoxic multinodular goiter: Secondary | ICD-10-CM | POA: Insufficient documentation

## 2012-07-17 DIAGNOSIS — J45909 Unspecified asthma, uncomplicated: Secondary | ICD-10-CM | POA: Insufficient documentation

## 2012-07-17 DIAGNOSIS — Z881 Allergy status to other antibiotic agents status: Secondary | ICD-10-CM | POA: Insufficient documentation

## 2012-07-17 DIAGNOSIS — M129 Arthropathy, unspecified: Secondary | ICD-10-CM | POA: Insufficient documentation

## 2012-07-17 DIAGNOSIS — K219 Gastro-esophageal reflux disease without esophagitis: Secondary | ICD-10-CM | POA: Insufficient documentation

## 2012-07-17 DIAGNOSIS — Z791 Long term (current) use of non-steroidal anti-inflammatories (NSAID): Secondary | ICD-10-CM | POA: Insufficient documentation

## 2012-07-17 DIAGNOSIS — F3289 Other specified depressive episodes: Secondary | ICD-10-CM | POA: Insufficient documentation

## 2012-07-17 HISTORY — PX: LIPOMA EXCISION: SHX5283

## 2012-07-17 LAB — POCT HEMOGLOBIN-HEMACUE: Hemoglobin: 15.9 g/dL (ref 13.0–17.0)

## 2012-07-17 SURGERY — EXCISION LIPOMA
Anesthesia: General | Site: Back | Wound class: Clean

## 2012-07-17 MED ORDER — LIDOCAINE HCL (CARDIAC) 20 MG/ML IV SOLN
INTRAVENOUS | Status: DC | PRN
  Administered 2012-07-17: 50 mg via INTRAVENOUS

## 2012-07-17 MED ORDER — DEXAMETHASONE SODIUM PHOSPHATE 4 MG/ML IJ SOLN
INTRAMUSCULAR | Status: DC | PRN
  Administered 2012-07-17: 10 mg via INTRAVENOUS

## 2012-07-17 MED ORDER — MIDAZOLAM HCL 2 MG/2ML IJ SOLN: 1.0000 mg | INTRAMUSCULAR | Status: DC | PRN

## 2012-07-17 MED ORDER — OXYCODONE HCL 5 MG/5ML PO SOLN: 5.0000 mg | Freq: Once | ORAL | Status: AC | PRN

## 2012-07-17 MED ORDER — BUPIVACAINE-EPINEPHRINE 0.5% -1:200000 IJ SOLN
INTRAMUSCULAR | Status: DC | PRN
  Administered 2012-07-17: 19 mL

## 2012-07-17 MED ORDER — LACTATED RINGERS IV SOLN
INTRAVENOUS | Status: DC
  Administered 2012-07-17: 12:00:00 via INTRAVENOUS

## 2012-07-17 MED ORDER — PROPOFOL 10 MG/ML IV BOLUS
INTRAVENOUS | Status: DC | PRN
  Administered 2012-07-17: 150 mg via INTRAVENOUS

## 2012-07-17 MED ORDER — SUFENTANIL CITRATE 50 MCG/ML IV SOLN
INTRAVENOUS | Status: DC | PRN
  Administered 2012-07-17: 10 ug via INTRAVENOUS

## 2012-07-17 MED ORDER — FENTANYL CITRATE 0.05 MG/ML IJ SOLN: 50.0000 ug | INTRAMUSCULAR | Status: DC | PRN

## 2012-07-17 MED ORDER — EPHEDRINE SULFATE 50 MG/ML IJ SOLN
INTRAMUSCULAR | Status: DC | PRN
  Administered 2012-07-17: 10 mg via INTRAVENOUS

## 2012-07-17 MED ORDER — OXYCODONE HCL 5 MG PO TABS
5.0000 mg | ORAL_TABLET | Freq: Once | ORAL | Status: AC | PRN
  Administered 2012-07-17: 5 mg via ORAL

## 2012-07-17 MED ORDER — HYDROMORPHONE HCL PF 1 MG/ML IJ SOLN: 0.2500 mg | INTRAMUSCULAR | Status: DC | PRN

## 2012-07-17 MED ORDER — ONDANSETRON HCL 4 MG/2ML IJ SOLN: 4.0000 mg | Freq: Once | INTRAMUSCULAR | Status: DC | PRN

## 2012-07-17 MED ORDER — MIDAZOLAM HCL 5 MG/5ML IJ SOLN
INTRAMUSCULAR | Status: DC | PRN
  Administered 2012-07-17: 2 mg via INTRAVENOUS

## 2012-07-17 MED ORDER — CEFAZOLIN SODIUM-DEXTROSE 2-3 GM-% IV SOLR
2.0000 g | INTRAVENOUS | Status: AC
  Administered 2012-07-17: 2 g via INTRAVENOUS

## 2012-07-17 MED ORDER — CHLORHEXIDINE GLUCONATE 4 % EX LIQD: 1.0000 "application " | Freq: Once | CUTANEOUS | Status: DC

## 2012-07-17 SURGICAL SUPPLY — 49 items
BANDAGE ELASTIC 6 VELCRO ST LF (GAUZE/BANDAGES/DRESSINGS) ×2 IMPLANT
BANDAGE GAUZE ELAST BULKY 4 IN (GAUZE/BANDAGES/DRESSINGS) ×2 IMPLANT
BENZOIN TINCTURE PRP APPL 2/3 (GAUZE/BANDAGES/DRESSINGS) IMPLANT
BLADE HEX COATED 2.75 (ELECTRODE) ×2 IMPLANT
BLADE SURG 15 STRL LF DISP TIS (BLADE) ×2 IMPLANT
BLADE SURG 15 STRL SS (BLADE) ×2
CANISTER SUCTION 1200CC (MISCELLANEOUS) ×2 IMPLANT
CHLORAPREP W/TINT 26ML (MISCELLANEOUS) ×4 IMPLANT
CLOTH BEACON ORANGE TIMEOUT ST (SAFETY) ×2 IMPLANT
COVER MAYO STAND STRL (DRAPES) ×2 IMPLANT
COVER TABLE BACK 60X90 (DRAPES) ×2 IMPLANT
DECANTER SPIKE VIAL GLASS SM (MISCELLANEOUS) IMPLANT
DERMABOND ADVANCED (GAUZE/BANDAGES/DRESSINGS) ×4
DERMABOND ADVANCED .7 DNX12 (GAUZE/BANDAGES/DRESSINGS) ×4 IMPLANT
DRAIN CHANNEL 19F RND (DRAIN) IMPLANT
DRAIN CHANNEL 7F FF FLAT (WOUND CARE) IMPLANT
DRAPE PED LAPAROTOMY (DRAPES) ×4 IMPLANT
DRAPE UTILITY XL STRL (DRAPES) ×4 IMPLANT
ELECT REM PT RETURN 9FT ADLT (ELECTROSURGICAL) ×2
ELECTRODE REM PT RTRN 9FT ADLT (ELECTROSURGICAL) ×1 IMPLANT
EVACUATOR SILICONE 100CC (DRAIN) IMPLANT
GAUZE SPONGE 4X4 12PLY STRL LF (GAUZE/BANDAGES/DRESSINGS) ×2 IMPLANT
GLOVE EUDERMIC 7 POWDERFREE (GLOVE) ×4 IMPLANT
GLOVE SKINSENSE NS SZ7.0 (GLOVE) ×1
GLOVE SKINSENSE STRL SZ7.0 (GLOVE) ×1 IMPLANT
GOWN PREVENTION PLUS XLARGE (GOWN DISPOSABLE) ×2 IMPLANT
GOWN PREVENTION PLUS XXLARGE (GOWN DISPOSABLE) ×2 IMPLANT
NEEDLE HYPO 25X1 1.5 SAFETY (NEEDLE) ×2 IMPLANT
NS IRRIG 1000ML POUR BTL (IV SOLUTION) ×2 IMPLANT
PACK BASIN DAY SURGERY FS (CUSTOM PROCEDURE TRAY) ×2 IMPLANT
PENCIL BUTTON HOLSTER BLD 10FT (ELECTRODE) ×2 IMPLANT
SLEEVE SCD COMPRESS KNEE MED (MISCELLANEOUS) ×2 IMPLANT
SPONGE GAUZE 4X4 12PLY (GAUZE/BANDAGES/DRESSINGS) ×2 IMPLANT
STAPLER VISISTAT 35W (STAPLE) IMPLANT
STRIP CLOSURE SKIN 1/2X4 (GAUZE/BANDAGES/DRESSINGS) IMPLANT
SUT ETHILON 3 0 PS 1 (SUTURE) IMPLANT
SUT MNCRL AB 4-0 PS2 18 (SUTURE) ×4 IMPLANT
SUT SILK 2 0 SH (SUTURE) IMPLANT
SUT VIC AB 2-0 SH 27 (SUTURE)
SUT VIC AB 2-0 SH 27XBRD (SUTURE) IMPLANT
SUT VIC AB 3-0 FS2 27 (SUTURE) IMPLANT
SUT VICRYL 3-0 CR8 SH (SUTURE) ×2 IMPLANT
SUT VICRYL 4-0 PS2 18IN ABS (SUTURE) IMPLANT
SYR BULB 3OZ (MISCELLANEOUS) ×2 IMPLANT
SYR CONTROL 10ML LL (SYRINGE) ×2 IMPLANT
TOWEL OR 17X24 6PK STRL BLUE (TOWEL DISPOSABLE) ×4 IMPLANT
TOWEL OR NON WOVEN STRL DISP B (DISPOSABLE) ×2 IMPLANT
TUBE CONNECTING 20X1/4 (TUBING) ×2 IMPLANT
YANKAUER SUCT BULB TIP NO VENT (SUCTIONS) ×2 IMPLANT

## 2012-07-17 NOTE — Anesthesia Postprocedure Evaluation (Signed)
  Anesthesia Post-op Note  Patient: Edgar Ross  Procedure(s) Performed: Procedure(s) with comments: EXCISION LIPOMA back and left thigh (N/A) - and Left thigh  Patient Location: PACU  Anesthesia Type:General  Level of Consciousness: awake, alert  and oriented  Airway and Oxygen Therapy: Patient Spontanous Breathing and Patient connected to face mask oxygen  Post-op Pain: mild  Post-op Assessment: Post-op Vital signs reviewed  Post-op Vital Signs: Reviewed  Complications: No apparent anesthesia complications

## 2012-07-17 NOTE — Interval H&P Note (Signed)
History and Physical Interval Note:  07/17/2012 11:42 AM  Edgar Ross  has presented today for surgery, with the diagnosis of lipomia excision back/left thigh  The goals and the various methods of treatment have been discussed with the patient and family. After consideration of risks, benefits and other options for treatment, the patient has consented to  Procedure(s): EXCISION LIPOMA back and left thigh (N/A) as a surgical intervention .  [name] history has been reviewed, patient examined today, no change in status, stable for surgery.  I have reviewed the patient's chart and labs.  Questions were answered to the patient's satisfaction.     [name]

## 2012-07-17 NOTE — Transfer of Care (Signed)
Immediate Anesthesia Transfer of Care Note  Patient: Edgar Ross  Procedure(s) Performed: Procedure(s) with comments: EXCISION LIPOMA back and left thigh (N/A) - and Left thigh  Patient Location: PACU  Anesthesia Type:General  Level of Consciousness: awake, alert  and oriented  Airway & Oxygen Therapy: Patient Spontanous Breathing and Patient connected to face mask oxygen  Post-op Assessment: Report given to PACU RN and Post -op Vital signs reviewed and stable  Post vital signs: Reviewed and stable  Complications: No apparent anesthesia complications

## 2012-07-17 NOTE — Anesthesia Preprocedure Evaluation (Addendum)
Anesthesia Evaluation  Patient identified by MRN, date of birth, ID band Patient awake    Reviewed: Allergy & Precautions, H&P , NPO status , Patient's Chart, lab work & pertinent test results  Airway Mallampati: I TM Distance: >3 FB Neck ROM: Full    Dental  (+) Upper Dentures and Lower Dentures   Pulmonary asthma ,  breath sounds clear to auscultation        Cardiovascular Rhythm:Regular Rate:Normal     Neuro/Psych    GI/Hepatic GERD-  Medicated and Controlled,  Endo/Other    Renal/GU      Musculoskeletal   Abdominal   Peds  Hematology   Anesthesia Other Findings   Reproductive/Obstetrics                           Anesthesia Physical Anesthesia Plan  ASA: II  Anesthesia Plan: General   Post-op Pain Management:    Induction: Intravenous  Airway Management Planned: LMA  Additional Equipment:   Intra-op Plan:   Post-operative Plan: Extubation in OR  Informed Consent: I have reviewed the patients History and Physical, chart, labs and discussed the procedure including the risks, benefits and alternatives for the proposed anesthesia with the patient or authorized representative who has indicated his/her understanding and acceptance.   Dental advisory given  Plan Discussed with: CRNA, Anesthesiologist and Surgeon  Anesthesia Plan Comments:         Anesthesia Quick Evaluation

## 2012-07-17 NOTE — Op Note (Signed)
Patient Name:           Edgar Ross   Date of Surgery:        07/17/2012  Pre op Diagnosis:   5 cm soft tissue mass right upper back, 5 cm soft tissue mass left anterior thigh     Post op Diagnosis:    Same, suspect benign lipoma  Procedure:                 Excision soft tissue Mass right upper back, deep subcutaneous tissue,Suspect lipoma                                      Excision soft tissue Mass left anterior thigh, intramuscular, suspect lipoma  Surgeon:                     Angelia Mould. Derrell Lolling, M.D., FACS  Assistant:                      None  Operative Indications:   Edgar Ross is a 62 y.o. male. He is referred back to me by Dr. Earlyne Iba for evaluation and surgical management of a large soft tissue mass of the right upper back and a large soft tissue mass of the left thigh. These are presumably lipomas. Exam reveals 5 cm smooth soft tissue mass right upper back near scapula. 5 cm soft tissue mass left distal thigh  anteriorly, overlying or in the quadriceps, well above the patella. Both had the texture of a lipoma.  I have seen this patient before for a Benign multinodular thyroid goiter which had been enlarging and causing some pressure symptoms. He has seen Dr. Lucianne Muss. He has declined surgery to date. He knows he will eventually need a thyroidectomy..  The mass on his right upper back was only noticed recently but he can see it in the mirror and it bothers him. The mass on his left thigh has been there for over one year. He can see it and it bothers him but there is minimal pain with both of these. He would like these areas excised.  Comorbidities include depression, GERD. Otherwise reasonably healthy.    Operative Findings:       In the left nterior thigh there was a 5 cm x 3 cm fatty encapsulated mass within the quadriceps muscle. This was firm but appeared benign. In the Right upper back near the scapula there was a 5 cm soft tissue mass consistent with a benign lipoma. This did  not appear to be invading the muscle.Both specimens were sent to pathology.  Procedure in Detail:          Following the induction of general endotracheal anesthesia a surgical time out was performed. The left leg was prepped and draped in a sterile fashion. Intravenous antibiotics were given. 0.5% Marcaine with epinephrine was used as a local infiltration anesthetic. A sagittally oriented incision was made in the left anterior thigh overlying the palpable mass in the distal one third of the biceps muscle. Dissection was carried down through subcutaneous tissue and then through the deep muscle fascia. I could still feel the mass in the deeper tissues  and so I  dissected the quadriceps muscle off of the mass and then dissected away from the deeper tissues. This seemed to be fairly well encapsulated. It was fatty in nature. It appeared more firm  than a typical lipoma but was removed completely. There was no bleeding. The deep muscle fascia was closed with interrupted 3-0 Vicryl sutures. Subcutaneous tissue was closed with 3-0 Vicryl sutures and the skin closed with a running subcuticular suture of 4-0 Monocryl, Dermabond,, Kerlix, and Ace wrap.  We then positioned the patient in a left lateral position which gave good exposure to the right upper back. Right upper back was prepped and draped in a sterile fashion. 0.5% Marcaine with epinephrine was used as local infiltration  anesthetic. A longitudinal incision was made, dissection was carried down through the dermis and into the deep subcutaneous tissue where I encountered a large encapsulated lipoma. This was carefully dissected in  its entirety away from the underlying tissues. I closed the deeper  layers with multiple interrupted sutures of 3-0 Vicryl closing down the dead space by taking bites of the muscle fascia. Superficial subcutaneous tissues closed with 3-0 Vicryl sutures and the skin closed with a running subcuticular suture of 4-0 Monocryl and  Dermabond. The patient tolerated the procedure well taken to recovery in stable condition. EBL 20 cc. Counts correct. Complications none.     Angelia Mould. Derrell Lolling, M.D., FACS General and Minimally Invasive Surgery Breast and Colorectal Surgery  07/17/2012 2:42 PM

## 2012-07-18 ENCOUNTER — Encounter (HOSPITAL_BASED_OUTPATIENT_CLINIC_OR_DEPARTMENT_OTHER): Payer: Self-pay | Admitting: General Surgery

## 2012-07-18 ENCOUNTER — Telehealth (INDEPENDENT_AMBULATORY_CARE_PROVIDER_SITE_OTHER): Payer: Self-pay | Admitting: General Surgery

## 2012-07-18 ENCOUNTER — Telehealth (INDEPENDENT_AMBULATORY_CARE_PROVIDER_SITE_OTHER): Payer: Self-pay

## 2012-07-18 NOTE — Telephone Encounter (Signed)
I called and gave the pt the message from Dr Derrell Lolling about his path.

## 2012-07-18 NOTE — Telephone Encounter (Signed)
Message copied by Ivory Broad on Thu Jul 18, 2012  1:52 PM ------      Message from: Edgar Ross      Created: Thu Jul 18, 2012 12:57 PM       Inform patient of Pathology report,.Tell him that both tumors were benign lipomas. This is good news. Will discuss with him at next office visit. ------

## 2012-07-18 NOTE — Progress Notes (Signed)
Quick Note:  Inform patient of Pathology report,.Tell him that both tumors were benign lipomas. This is good news. Will discuss with him at next office visit. ______

## 2012-07-18 NOTE — Telephone Encounter (Signed)
Pt home from lipomas removed yesterday.  Instructed pt to remove dressings today and shower.  OK to leave off dressings after bathing if they are dry.  He understands and will comply.

## 2012-08-06 ENCOUNTER — Ambulatory Visit (INDEPENDENT_AMBULATORY_CARE_PROVIDER_SITE_OTHER): Payer: Medicaid Other | Admitting: General Surgery

## 2012-08-06 ENCOUNTER — Encounter (INDEPENDENT_AMBULATORY_CARE_PROVIDER_SITE_OTHER): Payer: Self-pay | Admitting: General Surgery

## 2012-08-06 VITALS — BP 120/62 | HR 71 | Temp 99.3°F | Resp 18 | Ht 75.0 in | Wt 173.2 lb

## 2012-08-06 DIAGNOSIS — D1779 Benign lipomatous neoplasm of other sites: Secondary | ICD-10-CM

## 2012-08-06 DIAGNOSIS — E042 Nontoxic multinodular goiter: Secondary | ICD-10-CM

## 2012-08-06 DIAGNOSIS — D1720 Benign lipomatous neoplasm of skin and subcutaneous tissue of unspecified limb: Secondary | ICD-10-CM

## 2012-08-06 DIAGNOSIS — D171 Benign lipomatous neoplasm of skin and subcutaneous tissue of trunk: Secondary | ICD-10-CM

## 2012-08-06 NOTE — Progress Notes (Signed)
Patient ID: Edgar Ross, male   DOB: Oct 25, 1950, 62 y.o.   MRN: [id number] History: This patient underwent excision of a lipoma from his right upper back an intramuscular lipoma from his left anterior thigh on 07/17/2012. Final pathology report shows these both to be benign lipomas and I gave him a copy of the pathology report. There is a little bit of swelling. He is also being followed for what is thought to be a benign nontoxic multinodular goiter. He has some pressure symptoms. He has been told by me and Dr. Lucianne Muss that this will probably eventually need surgery and will not respond to Synthroid suppression. He asked about this again today. He has declined surgical intervention in the past.  Exam: Patient looks well. A little sleepy and slow-moving. The back wound is healing without any infection but there is a little bit of fluid collecting. After sterile prep I aspirated 18 cc of serosanguineous fluid. Sterile dressing. The left thigh wound is likewise healing without any signs of infection. There is a little bit of swelling but this appears just to be edema.  Assessment: Lipoma of back, benign Wound seroma back, aspirated today Lipoma of left anterior thigh, intramuscular, healing uneventfully Nontoxic multinodular goiter with pressure symptoms. Patient has deferred intervention to this point  Plan: Repeat thyroid ultrasound in June and see me at that time. We will discuss elective thyroidectomy at that time assuming that is still his interest. Wound care discussed.    Angelia Mould. Derrell Lolling, M.D., [facility] General and Minimally invasive Surgery Breast and Colorectal Surgery Office:   [phone] Pager:   [phone]

## 2012-08-06 NOTE — Patient Instructions (Signed)
You appear to be recovering from the surgery on your back and left thigh without any major complications.  We aspirated a little bit of fluid from the back wound. There is no evidence of infection.  You have been given a copy of the pathology report, which shows that both of these areas are benign lipomas.  You asked about followup regarding your thyroid goiter. You'll be scheduled for a thyroid ultrasound in June, and I will see you after the ultrasound is done. Ultimately, I suspect this enlarging goiter will require surgery due to the pressure symptoms.

## 2012-10-01 ENCOUNTER — Ambulatory Visit
Admission: RE | Admit: 2012-10-01 | Discharge: 2012-10-01 | Disposition: A | Discharge status: Home / Self Care | Payer: Medicaid Other | Source: Ambulatory Visit | Attending: General Surgery | Admitting: General Surgery

## 2012-10-01 DIAGNOSIS — E042 Nontoxic multinodular goiter: Secondary | ICD-10-CM

## 2012-10-07 ENCOUNTER — Telehealth (INDEPENDENT_AMBULATORY_CARE_PROVIDER_SITE_OTHER): Payer: Self-pay

## 2012-10-07 NOTE — Telephone Encounter (Signed)
Message copied by Ivory Broad on Mon Oct 07, 2012  1:23 PM ------      Message from: [name]      Created: Sun Oct 06, 2012  5:14 PM       Call patient.  Inform that both thyroid nodules have enlarged.  Offer office visit to discuss treatment plans. Surgical intervention is appropriate, and we should at least discuss this.            hmi      ----- Message -----         From: Rad Results In Interface         Sent: 10/01/2012   3:33 PM           To: [name], MD                   ------

## 2012-10-07 NOTE — Telephone Encounter (Signed)
I called and notified the pt of his results.  He has a follow up on 6/18.

## 2012-10-16 ENCOUNTER — Encounter (INDEPENDENT_AMBULATORY_CARE_PROVIDER_SITE_OTHER): Payer: Self-pay | Admitting: General Surgery

## 2012-10-16 ENCOUNTER — Ambulatory Visit (INDEPENDENT_AMBULATORY_CARE_PROVIDER_SITE_OTHER): Payer: Medicaid Other | Admitting: General Surgery

## 2012-10-16 VITALS — BP 120/82 | HR 60 | Temp 98.1°F | Resp 18 | Ht 75.0 in | Wt 170.0 lb

## 2012-10-16 DIAGNOSIS — D1779 Benign lipomatous neoplasm of other sites: Secondary | ICD-10-CM

## 2012-10-16 DIAGNOSIS — E042 Nontoxic multinodular goiter: Secondary | ICD-10-CM

## 2012-10-16 DIAGNOSIS — D1720 Benign lipomatous neoplasm of skin and subcutaneous tissue of unspecified limb: Secondary | ICD-10-CM

## 2012-10-16 DIAGNOSIS — D171 Benign lipomatous neoplasm of skin and subcutaneous tissue of trunk: Secondary | ICD-10-CM

## 2012-10-16 NOTE — Patient Instructions (Addendum)
The wounds on your back and left thigh and had healed completely. Nothing further needs to be done about that.  Your thyroid ultrasound shows that several of the nodules on the right side had enlarged.  Because the nodules continued to enlarge, there is a risk that there is low grade cancer present. You are also having pressure symptoms and swallowing problems. Your trachea and Adam's apple are pushed over to the left. Surgery is recommended.  You have decided to go ahead with the thyroid surgery, and you will be scheduled for a total thyroidectomy.    Thyroidectomy Thyroidectomy is the removal of part or all of your thyroid gland. Your thyroid gland is a butterfly-shaped gland at the base of your neck. It produces a substance called thyroid hormone, which regulates the physical and chemical processes that keep your body functioning and make energy available to your body (metabolism). The amount of thyroid gland tissue that is removed during a thyroidectomy depends on the reason for the procedure. Typically, if only a part of your gland is removed, enough thyroid gland tissue remains to maintain normal function. If your entire thyroid gland is removed or if the amount of thyroid gland tissue remaining is inadequate to maintain normal function, you will need life-long treatment with thyroid hormone on a daily basis. Thyroidectomy maybe performed when you have the following conditions:  Thyroid nodules. These are small, abnormal collections of tissue that form inside the thyroid gland. If these nodules begin to enlarge at a rapid rate, a sample of tissue from the nodule is taken through a needle and examined (needle biopsy). This is done to determine if the nodules are cancerous. Depending on the outcome of this exam, thyroidectomy may be necessary.  Thyroid cancer.  Goiter, which is an enlarged thyroid gland. All or part of the thyroid gland may be removed if the gland has become so large that it  causes difficulty breathing or swallowing.  Hyperthyroidism. This is when the thyroid gland produces too much thyroid hormone. Hypothyroidism can cause symptoms of fluctuating weight, intolerance to heat, irritability, shortness of breath, and chest pain. LET YOUR CAREGIVER KNOW ABOUT:   Allergies to food or medicine.  Medicines that you are taking, including vitamins, herbs, eyedrops, over-the-counter medicines, and creams.  Previous problems you have had with anesthetics or numbing medicines.  History of bleeding problems or blood clots.  Previous surgeries you have had.  Other health problems, including diabetes and kidney problems, you have had.  Possibility of pregnancy, if this applies. BEFORE THE PROCEDURE   Do not eat or drink anything, including water, for at least 6 hours before the procedure.  Ask your caregiver whether you should stop taking certain medicines before the day of the procedure. PROCEDURE  There are different ways that thyroidectomy is performed. For each type, you will be given a medicine to make you sleep (general anesthetic). The three main types of thyroidectomy are listed as follows:  Conventional thyroidectomy A cut (incision) in the center portion of your lower neck is made with a scalpel. Muscles below your skin are separated to gain access to your thyroid gland. Your thyroid gland is dissected from your windpipe (trachea). Often a drain is placed at the incision site to drain any blood that accumulates under the skin after the procedure. This drain will be removed before you go home. The wound from the incision should heal within 2 weeks.  Endoscopic thyroidectomy Small incisions are made in your lower neck. A small  instrument (endoscope) is inserted under your skin at the incision sites. The endoscope used for thyroidectomy consists of 2 flexible tubes. Inside one of the tubes is a video camera that is used to guide the Careers adviser. Tools to remove the  thyroid gland, including a tool to cut the gland (dissectors) and a suction device, are inserted through the other tube. The surgeon uses the dissectors to dissect the thyroid gland from the trachea and remove it.  Robotic thyroidectomy This procedure allows your thyroid gland to be removed through incisions in your armpit, your chest, or high in your neck. Instruments similar to endoscopes provide a 3-dimensional picture of the surgical site. Dissecting instruments are controlled by devices similar to joysticks. These devices allow more accurate manipulation of the instruments. After the blood supply to the gland is removed, the gland is cut into several pieces and removed through the incisions. RISKS AND COMPLICATIONS Complications associated with thyroidectomy are rare, but they can occur. Possible complications include:  A decrease in parathyroid hormone levels (hypoparathyroidism) Your parathyroid glands are located close behind your thyroid gland. They are responsible for maintaining calcium levels inthe body. If they are damaged or removed, levels of calcium in the blood become low and nerves become irritable, which can cause muscle spasms. Medicines are available to treat this.  Bacterial infection This can often be treated with medicines that kill bacteria (antibiotics).  Damage to your voice box nerves This could cause hoarseness or complete loss of voice.  Bleeding or airway obstruction. AFTER THE PROCEDURE   You will rest in the recovery room as you wake up.  When you first wake up, your throat may feel slightly sore.  You will not be allowed to eat or drink until instructed otherwise.  You will be taken to your hospital room. You will usually stay at the hospital for 1 or 2 nights.  If a drain is placed during the procedure, it usually is removed the next day.  You may have some mild neck pain.  Your voice may be weak. This usually is temporary. Document Released: 10/11/2000  Document Revised: 07/10/2011 Document Reviewed: 07/20/2010 [facility] Patient Information 2014 [place], [place].

## 2012-10-16 NOTE — Progress Notes (Addendum)
Patient ID: Edgar Ross, male   DOB: 07-05-1950, 62 y.o.   MRN: [id number]  Chief Complaint  Patient presents with  . Follow-up    HPI Edgar Ross is a 62 y.o. male.  This patient returns today for multiple reasons. He has an enlarging multinodular goiter with pressure symptoms, and also has had excision of lipoma of his left thigh and back.  He states of the wounds of his back and left thigh have healed and do not bother him. He states that he still has swallowing problems were sometimes food sticks in his throat although he does not vomit. He also notes that his trachea and Adam's apple are pushed over to the left. He doesn't really have any voice or breathing problems at this time.  Repeat thyroid ultrasound was performed on 10/01/2012. This shows that the dominant nodule in the upper right lobe has increased from 5.2-6.0 cm. A mid right thyroid nodule is 4.0 cm and is stable. A right lower pole nodule measures 3.1 x 2.7 x 3.7 where previously was 2.7 x 3.5 x 2.5 is thought to have large. The left-sided nodules are not change. There is no adenopathy seen on the ultrasound.  In the past he's had FNAs which were showed benign disease. In the past he's had normal thyroid function test. He has previously seen Dr. Lucianne Muss who advised that he will need surgery at some point.  He is now interested in thyroidectomy.   Family history is negative for multiple endocrine neoplasia. Mother had bone cancer. Father with unknown type of cancer.  Patient's past history significant for current smoking, anxiety disorder, and mild asthma. He is unemployed. HPI  Past Medical History  Diagnosis Date  . Arthritis   . GERD (gastroesophageal reflux disease)   . Thyroid disease   . Cough   . Visual disturbance   . Trouble swallowing   . Weakness   . Lipoma of back   . Asthma   . Full dentures     Past Surgical History  Procedure Laterality Date  . Back surgery  2000    laminectomy  . Bunionectomy  2013   . Knee surgery  1996    arthroscopic  . Bone spur removal  1978  . Lipoma excision N/A 07/17/2012    Procedure: EXCISION LIPOMA back and left thigh;  Surgeon: [name], MD;  Location:  SURGERY CENTER;  Service: General;  Laterality: N/A;  and Left thigh    Family History  Problem Relation Age of Onset  . Cancer Mother     bone  . Cancer Father     unknown    Social History History  Substance Use Topics  . Smoking status: Current Every Day Smoker -- 1.00 packs/day    Types: Cigarettes  . Smokeless tobacco: Former Neurosurgeon  . Alcohol Use: No    Allergies  Allergen Reactions  . Suboxone (Buprenorphine Hcl-Naloxone Hcl) Nausea And Vomiting  . Vancomycin Hives    Only where applied.    Current Outpatient Prescriptions  Medication Sig Dispense Refill  . ARIPiprazole (ABILIFY) 5 MG tablet Take 5 mg by mouth daily.      . citalopram (CELEXA) 20 MG tablet Take 40 mg by mouth daily.       Marland Kitchen esomeprazole (NEXIUM) 40 MG capsule Take 40 mg by mouth as needed.       Marland Kitchen HYDROcodone-acetaminophen (NORCO) 10-325 MG per tablet Take 1 tablet by mouth every 8 (eight) hours as needed for  pain.       No current facility-administered medications for this visit.    Review of Systems Review of Systems  Constitutional: Negative for fever, chills and unexpected weight change.  HENT: Positive for trouble swallowing. Negative for hearing loss, congestion, sore throat and voice change.   Eyes: Negative for visual disturbance.  Respiratory: Negative for cough and wheezing.   Cardiovascular: Negative for chest pain, palpitations and leg swelling.  Gastrointestinal: Negative for nausea, vomiting, abdominal pain, diarrhea, constipation, blood in stool, abdominal distention, anal bleeding and rectal pain.  Genitourinary: Negative for hematuria and difficulty urinating.  Musculoskeletal: Negative for arthralgias.  Skin: Negative for rash and wound.  Neurological: Negative for seizures,  syncope, weakness and headaches.  Hematological: Negative for adenopathy. Does not bruise/bleed easily.  Psychiatric/Behavioral: Negative for confusion.    Blood pressure 120/82, pulse 60, temperature 98.1 F (36.7 C), temperature source Oral, resp. rate 18, height 6\' 3"  (1.905 m), weight 170 lb (77.111 kg).  Physical Exam Physical Exam  Constitutional: He is oriented to person, place, and time. He appears well-developed and well-nourished. No distress.  HENT:  Head: Normocephalic.  Nose: Nose normal.  Mouth/Throat: No oropharyngeal exudate.  Eyes: Conjunctivae and EOM are normal. Pupils are equal, round, and reactive to light. Right eye exhibits no discharge. Left eye exhibits no discharge. No scleral icterus.  Neck: Normal range of motion. Neck supple. No JVD present. Tracheal deviation present. Thyromegaly present.  Very large thyroid goiter. Much larger right than left. Relatively soft to palpate. Trachea and larynx are deviated to the left. No cervical or suboccipital adenopathy. Voice  basically normal.  Cardiovascular: Normal rate, regular rhythm, normal heart sounds and intact distal pulses.   No murmur heard. Pulmonary/Chest: Effort normal and breath sounds normal. No stridor. No respiratory distress. He has no wheezes. He has no rales. He exhibits no tenderness.  Abdominal: Soft. Bowel sounds are normal. He exhibits no distension and no mass. There is no tenderness. There is no rebound and no guarding.  Musculoskeletal: Normal range of motion. He exhibits no edema and no tenderness.  Lymphadenopathy:    He has no cervical adenopathy.  Neurological: He is alert and oriented to person, place, and time. He has normal reflexes. Coordination normal.  Skin: Skin is warm and dry. No rash noted. He is not diaphoretic. No erythema. No pallor.  Psychiatric: He has a normal mood and affect. His behavior is normal. Judgment and thought content normal.    Data Reviewed Old records. Recent  ultrasound.  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 This is enlarging and he continues to have pressure symptoms. Enlargement of nodules increase his risk of malignancy, despite prior biopsy. He will eventually need to have thyroidectomy.   Tobacco abuse  Anxiety disorder    Plan    The patient states that he would like to go ahead with thyroidectomy. She is concerned about the pressure symptoms and a deviation of his trachea.  He will be scheduled for total thyroidectomy.  He is aware that he will need thyroid hormone replacement permanently and need to be followed from medical standpoint for that.  I discussed the indications, details, techniques, and numerous risks of the surgery with him. We've gone over the implications recurrent laryngeal nerve injury and parathyroid injury in detail. Risks of bleeding and infection were outlined. He understands all these issues and is now ready to go ahead.           Angelia Mould. Derrell Lolling,  M.D., [facility] General and Minimally invasive Surgery Breast and Colorectal Surgery Office:   [phone] Pager:   [phone]  10/16/2012, 8:55 AM

## 2012-10-18 ENCOUNTER — Encounter (INDEPENDENT_AMBULATORY_CARE_PROVIDER_SITE_OTHER): Payer: Self-pay | Admitting: General Surgery

## 2012-11-13 ENCOUNTER — Encounter (HOSPITAL_COMMUNITY): Payer: Self-pay | Admitting: Pharmacy Technician

## 2012-11-15 ENCOUNTER — Other Ambulatory Visit (HOSPITAL_COMMUNITY): Payer: Self-pay | Admitting: General Surgery

## 2012-11-15 NOTE — Patient Instructions (Addendum)
20 Azzam Mehra  11/15/2012   Your procedure is scheduled on: 11/22/12  FRIDAY   Report to [facility] at    0700   AM.  Call this number if you have problems the morning of surgery: [phone]       Remember:   Do not eat food  Or drink :After Midnight. Thursday NIGHT   Take these medicines the morning of surgery with A SIP OF WATER: abilify, celexa, nexium if needed, hydrocodone if needed    Contacts, dentures or partial plates can not be worn to surgery  Leave suitcase in the car. After surgery it may be brought to your room.  For patients admitted to the hospital, checkout time is 11:00 AM day of  discharge.             SPECIAL INSTRUCTIONS- SEE [facility] PREPARING FOR SURGERY INSTRUCTION SHEET-     DO NOT WEAR JEWELRY, LOTIONS, POWDERS, OR PERFUMES.  WOMEN-- DO NOT SHAVE LEGS OR UNDERARMS FOR 12 HOURS BEFORE SHOWERS. MEN MAY SHAVE FACE.   Jemima Petko PST [phone]               FAILURE TO FOLLOW THESE INSTRUCTIONS MAY RESULT IN  CANCELLATION   OF YOUR SURGERY                                                   Patient Signature _____________________________

## 2012-11-18 ENCOUNTER — Ambulatory Visit (HOSPITAL_COMMUNITY)
Admission: RE | Admit: 2012-11-18 | Discharge: 2012-11-18 | Disposition: A | Discharge status: Home / Self Care | Payer: Medicaid Other | Source: Ambulatory Visit | Attending: General Surgery | Admitting: General Surgery

## 2012-11-18 ENCOUNTER — Encounter (HOSPITAL_COMMUNITY): Payer: Self-pay

## 2012-11-18 ENCOUNTER — Telehealth (INDEPENDENT_AMBULATORY_CARE_PROVIDER_SITE_OTHER): Payer: Self-pay | Admitting: General Surgery

## 2012-11-18 ENCOUNTER — Telehealth (INDEPENDENT_AMBULATORY_CARE_PROVIDER_SITE_OTHER): Payer: Self-pay

## 2012-11-18 ENCOUNTER — Encounter (HOSPITAL_COMMUNITY)
Admission: RE | Admit: 2012-11-18 | Discharge: 2012-11-18 | Disposition: A | Discharge status: Home / Self Care | Payer: Medicaid Other | Source: Ambulatory Visit | Attending: General Surgery | Admitting: General Surgery

## 2012-11-18 DIAGNOSIS — R911 Solitary pulmonary nodule: Secondary | ICD-10-CM

## 2012-11-18 DIAGNOSIS — R9389 Abnormal findings on diagnostic imaging of other specified body structures: Secondary | ICD-10-CM

## 2012-11-18 DIAGNOSIS — J4489 Other specified chronic obstructive pulmonary disease: Secondary | ICD-10-CM | POA: Insufficient documentation

## 2012-11-18 DIAGNOSIS — J449 Chronic obstructive pulmonary disease, unspecified: Secondary | ICD-10-CM | POA: Insufficient documentation

## 2012-11-18 DIAGNOSIS — Z01810 Encounter for preprocedural cardiovascular examination: Secondary | ICD-10-CM | POA: Insufficient documentation

## 2012-11-18 DIAGNOSIS — Z01812 Encounter for preprocedural laboratory examination: Secondary | ICD-10-CM | POA: Insufficient documentation

## 2012-11-18 DIAGNOSIS — Z01818 Encounter for other preprocedural examination: Secondary | ICD-10-CM | POA: Insufficient documentation

## 2012-11-18 DIAGNOSIS — E042 Nontoxic multinodular goiter: Secondary | ICD-10-CM | POA: Insufficient documentation

## 2012-11-18 HISTORY — DX: Depression, unspecified: F32.A

## 2012-11-18 HISTORY — DX: Pneumonia, unspecified organism: J18.9

## 2012-11-18 HISTORY — DX: Anxiety disorder, unspecified: F41.9

## 2012-11-18 HISTORY — DX: Major depressive disorder, single episode, unspecified: F32.9

## 2012-11-18 LAB — CBC WITH DIFFERENTIAL/PLATELET
HCT: 47.8 % (ref 39.0–52.0)
Hemoglobin: 15.8 g/dL (ref 13.0–17.0)
Lymphocytes Relative: 32 % (ref 12–46)
Lymphs Abs: 3.1 10*3/uL (ref 0.7–4.0)
MCHC: 33.1 g/dL (ref 30.0–36.0)
Monocytes Absolute: 0.8 10*3/uL (ref 0.1–1.0)
Monocytes Relative: 9 % (ref 3–12)
Neutro Abs: 5.6 10*3/uL (ref 1.7–7.7)
Neutrophils Relative %: 58 % (ref 43–77)
RBC: 5.53 MIL/uL (ref 4.22–5.81)

## 2012-11-18 LAB — COMPREHENSIVE METABOLIC PANEL
AST: 35 U/L (ref 0–37)
Albumin: 3.8 g/dL (ref 3.5–5.2)
Calcium: 9.5 mg/dL (ref 8.4–10.5)
Creatinine, Ser: 1.02 mg/dL (ref 0.50–1.35)
Total Protein: 8.1 g/dL (ref 6.0–8.3)

## 2012-11-18 NOTE — Telephone Encounter (Signed)
Spoke with pt and informed him that he has a CT chest scheduled at [facility] radiology on 7/23 at 11:15.  Explained NPO after midnight.

## 2012-11-18 NOTE — Telephone Encounter (Signed)
I called and notified the pt per Dr Derrell Lolling that he has a left lung nodule that has gotten larger since 2011.  He had a cxr in 2011 that it showed up on.  The pt is unaware of it.  I told him we want to do a ct of his chest to see what is going on.  We will try to set this up in the next day or so.  Surgery is Friday.  Pt agrees with plan.

## 2012-11-20 ENCOUNTER — Encounter (INDEPENDENT_AMBULATORY_CARE_PROVIDER_SITE_OTHER): Payer: Self-pay | Admitting: General Surgery

## 2012-11-20 ENCOUNTER — Ambulatory Visit (HOSPITAL_COMMUNITY)
Admission: RE | Admit: 2012-11-20 | Discharge: 2012-11-20 | Disposition: A | Discharge status: Home / Self Care | Payer: Medicaid Other | Source: Ambulatory Visit | Attending: General Surgery | Admitting: General Surgery

## 2012-11-20 DIAGNOSIS — J438 Other emphysema: Secondary | ICD-10-CM | POA: Insufficient documentation

## 2012-11-20 DIAGNOSIS — R911 Solitary pulmonary nodule: Secondary | ICD-10-CM | POA: Insufficient documentation

## 2012-11-20 DIAGNOSIS — E049 Nontoxic goiter, unspecified: Secondary | ICD-10-CM | POA: Insufficient documentation

## 2012-11-20 DIAGNOSIS — R9389 Abnormal findings on diagnostic imaging of other specified body structures: Secondary | ICD-10-CM

## 2012-11-20 MED ORDER — IOHEXOL 300 MG/ML  SOLN
80.0000 mL | Freq: Once | INTRAMUSCULAR | Status: AC | PRN
  Administered 2012-11-20: 80 mL via INTRAVENOUS

## 2012-11-21 ENCOUNTER — Telehealth (INDEPENDENT_AMBULATORY_CARE_PROVIDER_SITE_OTHER): Payer: Self-pay | Admitting: General Surgery

## 2012-11-21 ENCOUNTER — Other Ambulatory Visit (INDEPENDENT_AMBULATORY_CARE_PROVIDER_SITE_OTHER): Payer: Self-pay

## 2012-11-21 DIAGNOSIS — R918 Other nonspecific abnormal finding of lung field: Secondary | ICD-10-CM

## 2012-11-21 NOTE — Addendum Note (Signed)
Addended byLiliana Cline on: 11/21/2012 04:12 PM   Modules accepted: Orders

## 2012-11-21 NOTE — Telephone Encounter (Signed)
Pt calling asking to confirm the conversation that he had with Dr Derrell Lolling earlier today. I looked over the phone message and relayed to him again that Dr Derrell Lolling explained that the CT of Chest is showing a left lung nodule that is highly suspicious for bronchogenic carcinoma. I advised the pt that he needed to be evaluated by a thoracic surgeon now for the lung nodule. I advised pt that the referrals had been made in epic and we were waiting for appt's. I advised pt that we know we need to get him seen ASAP so we will call him once we have an appt set up for him. The pt wanted to know the size of the lung nodule and he wanted to know what the size of the lung nodule was back in 2011 when he had his last CXR. I gave him the size of the left lung nodule seen on CT scan from 7/23 showing 1.5 x1.3 cm. I advised pt that the cxr from 2011 did not say anything about a lung nodule it did describe lung density which recommended the pt to go have a CT for close followup with pt's history of being a smoker. The pt said he would be waiting for our call on the appt.with the thoracic surgeon.

## 2012-11-21 NOTE — Telephone Encounter (Signed)
CT scan of the chest shows a spiculated left lung nodule, highly suspicious for bronchogenic carcinoma. No mediastinal adenopathy. There was incompletely imaged liver but there were some hyperenhancing lesions of uncertain significance. Air-fluid level in the esophagus suggest esophageal dysmotility. Thyroid goiter again noted.  I called Edgar Ross and told him that my recommendation was to postpone his thyroid surgery until his lung nodule could be thoroughly evaluated by a thoracic surgeon. He agrees with this.  My office will cancel the thyroid surgery for now, and refer him to one of our thoracic surgeons for evaluation.   Angelia Mould. Derrell Lolling, M.D., [facility] General and Minimally invasive Surgery Breast and Colorectal Surgery Office:   [phone] Pager:   [phone]

## 2012-11-22 ENCOUNTER — Encounter: Payer: Self-pay | Admitting: Cardiothoracic Surgery

## 2012-11-22 ENCOUNTER — Encounter (HOSPITAL_COMMUNITY): Admission: RE | Payer: Self-pay | Source: Ambulatory Visit

## 2012-11-22 ENCOUNTER — Institutional Professional Consult (permissible substitution) (INDEPENDENT_AMBULATORY_CARE_PROVIDER_SITE_OTHER): Payer: Medicaid Other | Admitting: Cardiothoracic Surgery

## 2012-11-22 ENCOUNTER — Ambulatory Visit (HOSPITAL_COMMUNITY): Admission: RE | Admit: 2012-11-22 | Payer: Medicaid Other | Source: Ambulatory Visit | Admitting: General Surgery

## 2012-11-22 VITALS — BP 125/85 | HR 64 | Resp 20 | Ht 75.0 in | Wt 169.0 lb

## 2012-11-22 DIAGNOSIS — R911 Solitary pulmonary nodule: Secondary | ICD-10-CM

## 2012-11-22 SURGERY — THYROIDECTOMY
Anesthesia: General

## 2012-11-22 NOTE — Patient Instructions (Signed)
Pulmonary Nodule A pulmonary (lung) nodule is small, round growth in the lung. The size of a pulmonary nodule can be as small as a pencil eraser (1/5 inch or 4 millmeters) to a little bigger than your biggest toenail (1 inch or 25 millimeters). A pulmonary nodule is usually an unplanned finding. It may be found on a chest X-ray or a computed tomography (CT) scan when you have imaging tests of your lungs done. When a pulmonary nodule is found, tests will be done to determine if the nodule is benign (not cancerous) or malignant (cancerous). Follow-up treatment or testing is based on the size of the pulmonary nodule and your risk of getting lung cancer.  CAUSES Causes of pulmonary nodules can vary.  Benign pulmonary nodules  can be caused from different things. Some of these things include:  Infection. This can be a common cause of a benign pulmonary nodule. The infection may be active (a current infection) or an old infection that is no longer active. Three types of infections can cause a pulmonary nodule. These are:  Bacterial Infection.  Fungal infection.  Viral Infections.  Hematoma. This is a bruise in the lung. A hematoma can happen from an injury to your chest.  Some common diseases can lead to benign pulmonary nodules. For example, rheumatoid arthritis can be a cause of a pulmonary nodule.  Other unusual things can cause a benign pulmonary nodule. These can include:  Having had tuberculosis.  Rare diseases, such as a lung cyst. Malignant pulmonary nodules.  These are cancerous growths. The cancer may have:  Started in the lung. Some lung cancers first detected as a pulmonary nodule.  Spread to the lung from cancer somewhere else in the body. This is called metastatic cancer.  Certain risk factors make a cancerous pulmonary nodule more likely. They include:  Age. As people get older, a pulmonary nodule is more likely to be cancerous.  Cancer history. If one of your immediate  family members has had cancer, you have a higher risk of developing cancer.  Smoking. This includes people who currently smoke and those who have quit. DIAGNOSIS To diagnose whether a pulmonary nodule is benign or malignant, a variety of tests will be done. This includes things such as:  Health history. Questions regarding your current health, past health, and family health will be asked.  Blood tests. Results of blood work can show:  Tumor markers for cancer.  Any type of infection.  A skin test called a tuberculin (TB) test may be done. This test can tell if you have been exposed to the germ that causes tuberculosis.  Imaging tests. These take pictures of your lungs. Types of imaging tests include:  Chest X-ray. This can help in several ways. An X-ray gives a close-up look at the pulmonary nodule. A new X-ray can be compared with any X-rays you have had in the past.   Computed tomography  (CT) scan. This test shows smaller pulmonary nodules more clearly than an X-ray.  Positron emission tomography  (PET) scan. This is a test that uses a radioactive substance to identify a pulmonary nodule. A safe amount of radioactive substance is injected into the blood stream. Then, the scan takes a picture of the pulmonary nodule. A malignant pulmonary nodule will absorb the substance faster than a benign pulmonary nodule. The radioactive substance is eliminated from your body in your urine.  Biopsy.  This removes a tiny piece of the pulmonary nodule so it can be checked  under a microscope. Medicine will be given to help keep you relaxed and pain free when a biopsy is done. Types of biopsies include:  Bronchoscopy . This is a surgical procedure. It can be used for pulmonary nodules that are close to the airways in the lung. It uses a scope (a thin tube) with a tiny camera and light on the end. The scope is put in the windpipe. Your caregiver can then see inside the lung. A tiny tool put through the  scope is used to take a small sample of the pulmonary nodule tissue.  Transthoracic needle aspiration . This method is used if the pulmonary nodule is far away from the air passages in the lung. A long, thin needle is put through the chest into the lung nodule. A CT scan is done at the same time which can make it easier to locate the pulmonary nodule.  Surgical lung biopsy . This is a surgical procedure in which the pulmonary nodule is removed. This is usually recommended when the pulmonary nodule is most likely malignant or a biopsy cannot be obtained by either bronchoscopy or transthoracic needle aspiration. PULMONARY NODULE FOLLOW-UP RECOMMENDATIONS The frequency of pulmonary nodule follow-up is based on your risk factors and size of the pulmonary nodule. If your caregiver suspects the pulmonary nodule is cancerous or the pulmonary nodule changes during any of the follow-up CT scans, additional testing or biopsies will be done.   If you have no or low risk of getting lung cancer (non-smoker, no personal cancer history), recommended follow-up is based on the following pulmonary nodule size:  A pulmonary nodule that is < 4 mm does not require any follow-up.  A pulmonary nodule that is 4 to 6 mm should be re-imaged by CT scan in 12 months.  A pulmonary nodule that is 6 to 8 mm should be re-imaged by CT scan at 6 to 12 months and then again at 18 to 24 months if no change in size.  A pulmonary nodule > 8 mm in size should be followed closely and re-imaged by CT scan at 3, 9, and 24 months.   If you are at risk of getting lung cancer (current or former smoker, family history of cancer), recommended follow-up is based on the following pulmonary nodule size:  A pulmonary nodule that is < 4 mm in size should be re-imaged by CT scan in 12 months.  A pulmonary nodule that is 4 to 6 mm in size should be re-imaged by CT scan at 6 to 12 months and again at 18 to 24 months.  A pulmonary nodule that is  6 to 8 mm in size should be re-imaged by CT scan at 3, 9, and 24 months.  A pulmonary nodule > 8 mm in size should be followed closely and re-imaged by CT scan at 3, 9, and 24 months. SEEK MEDICAL CARE IF: While waiting for test results to determine what type of pulmonary nodule you have, be sure to contact your caregiver if you:  Have trouble breathing when you are active.  Feel sick or unusually tired.  Do not feel like eating.  Lose weight without trying to.  Develop chills or night sweats.  Mild or moderate fevers generally have no long-term effects and often do not require treatment. There are a few exceptions (see below). SEEK IMMEDIATE MEDICAL CARE IF:  You cannot catch your breath or you begin wheezing.  You cannot stop coughing.  You cough up blood.  You feel like you are going to pass out or become dizzy.  You have sudden chest pain.  You have a fever or persistent symptoms for more than 72 hours.  You have a fever and your symptoms suddenly get worse. MAKE SURE YOU   Understand these instructions.  Will watch your condition.  Will get help right away if you are not doing well or get worse. Document Released: 02/12/2009 Document Revised: 07/10/2011 Document Reviewed: 02/12/2009 [facility] Patient Information 2014 [place], [place]. Lung Cancer Lung cancer is a tumor which starts as a growth in your lungs. Cancer is a group of many related diseases that begin in cells, the building blocks of the body. Normally, cells grow and divide to produce more cells only when the body needs them. Sometimes cells keep dividing when new cells are not needed. These extra cells may form a mass of tissue called a growth or tumor. Tumors can be either benign (not cancerous) or malignant (cancerous). Cancer can begin in any organ or tissue of the body. The original tumor (where the tumor started out) is called the primary cancer and is usually named for where it begins.  Lung cancer is  the most common cause of cancer death in men and women. There are several different types of lung cancers. Usually, lung cancer is described as either small-cell lung cancer or non-small-cell lung cancer. Other types of cancer occur in the lungs, including carcinoid and cancers spread from other organs. The types of cancer have different behavior and treatment. CAUSES  This cancer usually starts when the lungs are exposed to harmful chemicals. When you quit smoking, your risk of lung cancer falls each year (but is never the same as a person who has never smoked).  Other risks include:   Radon gas exposure.  Asbestos and other industrial substance exposure.  Second hand tobacco smoke.  Air pollution.  Family or personal history of lung cancer.  Age over 33. SYMPTOMS  Lung cancer can cause many symptoms. They depend on the type of cancer, its location and other factors. Symptoms of lung cancer can include:  Cough (either new, different or more severe).  Shortness of breath.  Coughing up blood (hemoptysis).  Chest pain.  Hoarseness.  Swelling of the face.  Drooping eyelid.  Changes in blood tests: low sodium (hyponatremia), high calcium (hypercalcemia) or low blood count (anemia).  Weight loss. In its early stages, lung cancer may not have symptoms and can be discovered by accident. Many of the symptoms above can be caused by diseases other than lung cancer. DIAGNOSIS  In early lung cancer, the patient often does not notice problems. It usually has spread by the time problems are first noticed. Your caregiver may suspect lung cancer based on your symptoms, your exam or based on tests (such as x-rays) obtained for other reasons. Common tests that help your caregiver diagnose your condition include:  Chest x-ray.  CT scan of the lungs and chest.  Blood tests. If a tumor is found, a biopsy will be necessary to confirm that cancer is present and to determine the type of  cancer. TREATMENT   Surgery offers a hope for a cure if the cancer has not spread and the cancer is not a small cell (oat cell) cancer of the lung. Surgery cannot cure the small cell type of cancer.  Radiation Therapy is a form of high energy X-ray that helps slow or kill the cancer. It is often used along with medications (chemotherapy)  to help treat the cancer and control pain.  Chemotherapy is used in combination with surgery in advanced cancer. It is also used in all small cell cancers.  Many new treatments look promising.  Your caregiver can give you more information and discuss treatment options that are best for your type of cancer. HOME CARE INSTRUCTIONS   If you smoke, stop!  Take all medications as told.  Keep all appointments with your caregiver and other specialists.  Ask your caregiver if you should see a cancer specialist, if that has not been arranged.  If you require oxygen or breathing equipment, be sure you know how to use it and who to call with questions.  Follow any special diet directions. If you have problems with appetite, ask your caregiver for help. SEEK MEDICAL CARE IF:   You have had a surgical procedure are you are having trouble recovering.  You have ongoing weight loss.  You have decreased strength or energy past the point when your caregiver said you would feel better.  You develop nausea or lightheadedness.  You have pain that is not improving. SEEK IMMEDIATE MEDICAL CARE IF:   You cough up clotted blood or bright red blood.  Your pain is uncontrolled.  You develop new difficulty breathing or chest pain.  You develop swelling in one or both ankles or legs, or swelling in your face or neck.  You develop new headache or confusion. Document Released: 07/24/2000 Document Revised: 07/10/2011 Document Reviewed: 05/04/2008 [facility] Patient Information 2014 [place], [place]. Thoracoscopy Thoracoscopy is a procedure in which a thin, lighted  tube (thoracoscope) is put through a small cut (incision) in the chest wall. This procedure makes it possible for your caregiver to look at the lungs or other structures in the chest cavity and to do some minor operations. This is a more minor procedure than thoracotomy, which opens the chest cavity with a large incision. Thoracoscopy can sometimes be used instead of thoracotomy. Thoracoscopy usually involves less pain, a shorter hospital stay, and a shorter recovery time. Common reasons for this procedure are:  To study diseases or problems in the chest.  To take a tissue sample (biopsy) to study under a microscope.  To put medicines directly into the lungs.  To remove collections of fluid, pus (empyema), or blood in the chest. LET YOUR CAREGIVER KNOW ABOUT:   Allergies to food or medicine.  Medicines taken, including vitamins, herbs, eyedrops, over-the-counter medicines, and creams.  Use of steroids (by mouth or creams).  Previous problems with anesthetics or numbing medicines.  History of bleeding problems or blood clots.  Previous surgery.  Any history of heart problems.  Other health problems, including diabetes and kidney problems.  Possibility of pregnancy, if this applies. RISKS AND COMPLICATIONS   If too much bleeding occurs, or if the surgery turns out to be major, it may be necessary to open the chest (thoracotomy) to control the bleeding.  There is a risk of injury to nerves or other structures in the chest as a result of the placement of the instruments.  When the chest tube is removed, the lung may collapse (pneumothorax). If this happens, the tube may need to be reinserted and left in place until a time when the lung will remain expanded as the tube is removed. BEFORE THE PROCEDURE   You may have routine tests done, such as blood tests, urine tests, and chest X-rays.  Electrocardiography (EKG) to record the electrical activity of the heart may be done  to make  sure the heart is okay.  Do not eat or drink after midnight the night before the procedure. Medicine given before the procedure that makes you sleep (general anesthetic) may cause vomiting. A patient who vomits is in danger of inhaling food into the lungs. This can cause serious complications and can be life-threatening. PROCEDURE  Video-assisted thoracic surgery (VATS) is surgery using a thoracoscope with a small video camera on the end. The picture from inside the chest is displayed on a television screen for the surgeon to see. The lung being worked on is collapsed and several small incisions are made to insert instruments. The surgeon can manipulate the instruments while watching on the television screen. The thoracoscope may be removed and put into different areas as needed. When the procedure is finished, the surgeon expands the lung and puts one or more chest tubes in the chest. The chest tubes allow the lung to expand and allow fluid (drainage) to come out. The remaining incisions are closed with stitches (sutures) or staples. AFTER THE PROCEDURE   The chest tube is left in place for 1 to several days to drain fluid or air from the chest cavity.  Hospital stays range from 1 to 5 days depending on the procedure and treatment. Document Released: 01/14/2003 Document Revised: 07/10/2011 Document Reviewed: 10/05/2010 [facility] Patient Information 2014 [place], [place]. Lung Resection A lung resection is surgery to remove a lung. When an entire lung is removed, the procedure is called a pneumonectomy. When only part of a lung is removed, the procedure is called a lobectomy. A lung resection is typically done to get rid of a tumor or cancer. This surgery can help relieve some or all of your symptoms. The surgery can also help keep the problem from getting worse. It may provide the best chance for curing your disease. However, surgery may not necessarily cure lung cancer, if that is the problem. Most  people need to stay in the hospital for several days after this procedure.  LET YOUR CAREGIVER KNOW ABOUT:  Allergies to food or medicine.  Medicines taken, including vitamins, herbs, eyedrops, over-the-counter medicines, and creams.  Use of steroids (by mouth or creams).  Previous problems with anesthetics or numbing medicines.  History of bleeding problems or blood clots.  Previous surgery.  Other health problems, including diabetes and kidney problems.  Possibility of pregnancy, if this applies. RISKS AND COMPLICATIONS  Lung resections have been done for many years with good results and few complications. However, all surgery is associated with possible risks. Some of these risks are:  Excessive bleeding.  Infection.  Inability to breath without a ventilator.  Persistent shortness of breath.  Heart problems, including abnormal rhythms and a risk of heart attack or heart failure.  Blood clots.  Injury to a blood vessel.  Injury to a nerve.  Failure to heal properly.  Stroke.  Bronchopleural fistula. This is a small hole between one of the main breathing tubes and the lining of the lungs. BEFORE THE PROCEDURE  In order to prepare for surgery, your caregiver may ask for several tests to be done. These may include:  Blood tests.  Urine tests.  X-rays.  Imaging tests, such as CT scans, MRI scans, and PET scans. These tests are done to find the exact size and location of the tumor that will be removed.  Pulmonary function tests (PFTs). These are breathing tests to assess the function of your lungs before surgery and to decide how to  best help your breathing after surgery.  Heart testing. This is done to make sure your heart is strong enough for the procedure.  Bronchoscopy. This is a technique that allows your caregiver to look at the inside of your airways. This is done using a soft, flexible tube (bronchoscope). Along with imaging tests, this can help your  caregiver know the exact location and size of the area that will be removed during surgery.  Lymph node sampling. This may need to be done to see if the tumor has spread. It may be done as a separate surgery or right before your lung resection procedure. PROCEDURE  An intravenous line (IV) will be placed in your arm. You will be given medicine that makes you sleep (general anesthetic).  Once you are asleep, a breathing tube is placed into your windpipe. You may also get pain medicine through a thin, flexible tube (catheter) in your back. The catheter is put through your skin and next to your spinal cord, where it releases anesthetic medicine.  Next, you will be turned onto your side. This makes it easier for your surgeon to reach the area of your ribcage where the surgical cut (incision) will be made. This area is washed with a disinfectant solution and might also be shaved. A catheter will be put into your bladder to collect urine. Another tube will be carefully passed through your throat and into your stomach.  The surgeon will make an incision on your side, which will start between two of your ribs and go around to your back. Your ribs will be spread and held open. Part of one rib may be removed to make it easier for the surgeon to reach your lung.  Your surgeon will carefully cut the veins, arteries, and bronchus leading to the lung. After being cut, each of these pieces will be sewn or stapled closed. Then, the lung or part of the lung will be removed.  Your surgeon will check inside your chest to make sure there is no bleeding in or around the lungs. Lymph nodes near the lung may also be removed for later tests. This is done to check if your problems have spread to the lymph nodes.  Depending on your situation, your surgeon may put tubes into your chest to drain extra fluid and air from the chest cavity after surgery. After the tubes are in, your ribcage will be closed with stitches. The  stitches help your ribcage heal and keep it from moving. After this, the layers of tissue under the skin are closed with more stitches, which will dissolve inside your body over time. Finally, your skin is closed with stitches or staples and covered with a bandage. AFTER THE PROCEDURE   After surgery, you will be taken to the recovery area where a nurse will monitor your progress. You may still have a breathing tube, spinal catheter, bladder catheter, stomach tube, and possibly chest tubes inside your body. These will be removed during your recovery. You may be put on a respirator following surgery if some assistance is needed to help your breathing. When you are awake, stable, and without complications, you will likely continue recovery in the intensive care unit (ICU).  As you wake up, you might feel some aches and pains in your chest and throat. Sometimes during recovery, patients may shiver or feel nauseous. Both of these symptoms are temporary and may be caused by the anesthesia. Your caregivers can give you medicine to help these problems go  away.  The breathing tube will be taken out as soon as your caregivers feel you can breathe on your own. For most people, this happens on the same day as the surgery.  If your surgery and time in the ICU go well, most of the tubes and equipment will be taken out within the first 1 to 2 days after surgery. This is about how long most people stay in the ICU. You may need to stay longer, depending on how you are doing.  You should also start respiratory therapy in the ICU. This therapy uses breathing exercises to help your other lung stay healthy and get stronger.  As you improve, you will be moved to a regular hospital room for continued respiratory therapy, help with your bladder and bowels, and to continue medicines. Most people stay in the hospital for 5 to 7 days. However, your stay may be longer, depending on how your surgery went and how well you are  doing.  After your lung or part of your lung is taken out, there will be a space inside your chest. This space will often fill up with fluid over time. The amount of time this takes is different for each person. Because your chest needs to fill with fluid, your surgeon may or may not put a drainage tube in your chest. If there is a chest tube, it will most likely be removed within 24 hours after the surgery.  You will receive care until you are doing well and your caregiver feels it is safe for you to go home or to transfer to an extended care facility. Document Released: 07/08/2002 Document Revised: 07/10/2011 Document Reviewed: 12/15/2010 [facility] Patient Information 2014 [place], [place]. Lung Resection Care After Refer to this sheet in the next few weeks. These instructions provide you with information on caring for yourself after your procedure. Your caregiver may also give you more specific instructions. Your treatment has been planned according to current medical practices, but problems sometimes occur. Call your caregiver if you have any problems or questions after your procedure. HOME CARE INSTRUCTIONS  You may resume a normal diet and activities as directed.  Do not smoke or use tobacco products.  Change your bandages (dressings) as directed.  Only take over-the-counter or prescription medicines for pain, discomfort, or fever as directed by your caregiver.  Keep all follow-up appointments as directed.  Try to breathe deeply and cough as directed. Holding a pillow firmly over your ribs may help with discomfort.  If you were given an incentive spirometer in the hospital, continue to use it as directed.  Walk as directed by your caregiver.  You may take a shower and gently wash the area of your surgical cut (incision) with water and soap as directed. Do not use anything else to clean your incision except as directed by your caregiver. Do not take baths or sit in a hot tub. SEEK  MEDICAL CARE IF:  You notice redness, swelling, or increasing pain in the incision.  You are bleeding from the incision.  You see pus coming from the incision.  You notice a bad smell coming from the incision or dressing.  Your incision breaks open.  You cough up blood or pus, or you develop a cough that produces bad smelling sputum.  You have pain or swelling in your legs.  You have increasing pain that is not controlled with medicine.  You have trouble managing any of the tubes that have been left in place after  surgery. SEEK IMMEDIATE MEDICAL CARE IF:   You have a fever or chills.  You have any reaction or side effects to medicines given.  You have chest pain or an irregular or rapid heartbeat.  You have dizzy episodes or fainting.  You have shortness of breath or difficulty breathing.  You have persistent nausea or vomiting.  You have a rash. MAKE SURE YOU:  Understand these instructions.  Will watch your condition.  Will get help right away if you are not doing well or get worse. Document Released: 11/04/2004 Document Revised: 07/10/2011 Document Reviewed: 12/15/2010 [facility] Patient Information 2014 [place], [place].
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
## 2012-11-25 ENCOUNTER — Other Ambulatory Visit: Payer: Self-pay | Admitting: *Deleted

## 2012-11-25 DIAGNOSIS — R918 Other nonspecific abnormal finding of lung field: Secondary | ICD-10-CM

## 2012-11-26 ENCOUNTER — Ambulatory Visit (HOSPITAL_COMMUNITY)
Admission: RE | Admit: 2012-11-26 | Discharge: 2012-11-26 | Disposition: A | Discharge status: Home / Self Care | Payer: Medicaid Other | Source: Ambulatory Visit | Attending: Cardiothoracic Surgery | Admitting: Cardiothoracic Surgery

## 2012-11-26 ENCOUNTER — Encounter (HOSPITAL_COMMUNITY)
Admission: RE | Admit: 2012-11-26 | Discharge: 2012-11-26 | Disposition: A | Discharge status: Home / Self Care | Payer: Medicaid Other | Source: Ambulatory Visit | Attending: Cardiothoracic Surgery | Admitting: Cardiothoracic Surgery

## 2012-11-26 DIAGNOSIS — R911 Solitary pulmonary nodule: Secondary | ICD-10-CM | POA: Insufficient documentation

## 2012-11-26 DIAGNOSIS — R222 Localized swelling, mass and lump, trunk: Secondary | ICD-10-CM | POA: Insufficient documentation

## 2012-11-26 DIAGNOSIS — F17200 Nicotine dependence, unspecified, uncomplicated: Secondary | ICD-10-CM | POA: Insufficient documentation

## 2012-11-26 DIAGNOSIS — R918 Other nonspecific abnormal finding of lung field: Secondary | ICD-10-CM

## 2012-11-26 DIAGNOSIS — J988 Other specified respiratory disorders: Secondary | ICD-10-CM | POA: Insufficient documentation

## 2012-11-26 DIAGNOSIS — E041 Nontoxic single thyroid nodule: Secondary | ICD-10-CM | POA: Insufficient documentation

## 2012-11-26 LAB — PULMONARY FUNCTION TEST

## 2012-11-26 LAB — GLUCOSE, CAPILLARY: Glucose-Capillary: 84 mg/dL (ref 70–99)

## 2012-11-26 MED ORDER — ALBUTEROL SULFATE (5 MG/ML) 0.5% IN NEBU
2.5000 mg | INHALATION_SOLUTION | Freq: Once | RESPIRATORY_TRACT | Status: AC
  Administered 2012-11-26: 2.5 mg via RESPIRATORY_TRACT

## 2012-11-26 MED ORDER — FLUDEOXYGLUCOSE F - 18 (FDG) INJECTION
17.1000 | Freq: Once | INTRAVENOUS | Status: AC | PRN
  Administered 2012-11-26: 17.1 via INTRAVENOUS

## 2012-11-27 ENCOUNTER — Encounter: Payer: Self-pay | Admitting: Cardiothoracic Surgery

## 2012-11-27 ENCOUNTER — Ambulatory Visit: Payer: Medicaid Other | Admitting: Cardiothoracic Surgery

## 2012-11-27 ENCOUNTER — Other Ambulatory Visit: Payer: Self-pay

## 2012-11-27 ENCOUNTER — Encounter: Payer: Self-pay | Admitting: *Deleted

## 2012-11-27 ENCOUNTER — Ambulatory Visit (INDEPENDENT_AMBULATORY_CARE_PROVIDER_SITE_OTHER): Payer: Medicaid Other | Admitting: Cardiothoracic Surgery

## 2012-11-27 VITALS — BP 140/90 | HR 62 | Resp 20 | Ht 75.0 in | Wt 169.0 lb

## 2012-11-27 DIAGNOSIS — D381 Neoplasm of uncertain behavior of trachea, bronchus and lung: Secondary | ICD-10-CM

## 2012-11-27 DIAGNOSIS — R911 Solitary pulmonary nodule: Secondary | ICD-10-CM
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
## 2012-12-02 ENCOUNTER — Ambulatory Visit (HOSPITAL_COMMUNITY)
Admission: RE | Admit: 2012-12-02 | Discharge: 2012-12-02 | Disposition: A | Discharge status: Home / Self Care | Payer: Medicaid Other | Source: Ambulatory Visit | Attending: Cardiothoracic Surgery | Admitting: Cardiothoracic Surgery

## 2012-12-02 ENCOUNTER — Encounter (HOSPITAL_COMMUNITY): Payer: Self-pay

## 2012-12-02 ENCOUNTER — Encounter (HOSPITAL_COMMUNITY)
Admission: RE | Admit: 2012-12-02 | Discharge: 2012-12-02 | Disposition: A | Discharge status: Home / Self Care | Payer: Medicaid Other | Source: Ambulatory Visit | Attending: Cardiothoracic Surgery | Admitting: Cardiothoracic Surgery

## 2012-12-02 VITALS — BP 131/84 | HR 64 | Temp 98.0°F | Resp 18 | Ht 75.0 in | Wt 171.5 lb

## 2012-12-02 DIAGNOSIS — Z01818 Encounter for other preprocedural examination: Secondary | ICD-10-CM | POA: Insufficient documentation

## 2012-12-02 DIAGNOSIS — J4489 Other specified chronic obstructive pulmonary disease: Secondary | ICD-10-CM | POA: Insufficient documentation

## 2012-12-02 DIAGNOSIS — R918 Other nonspecific abnormal finding of lung field: Secondary | ICD-10-CM | POA: Insufficient documentation

## 2012-12-02 DIAGNOSIS — D381 Neoplasm of uncertain behavior of trachea, bronchus and lung: Secondary | ICD-10-CM

## 2012-12-02 DIAGNOSIS — J449 Chronic obstructive pulmonary disease, unspecified: Secondary | ICD-10-CM | POA: Insufficient documentation

## 2012-12-02 DIAGNOSIS — Z01812 Encounter for preprocedural laboratory examination: Secondary | ICD-10-CM | POA: Insufficient documentation

## 2012-12-02 HISTORY — DX: Paresthesia of skin: R20.2

## 2012-12-02 LAB — URINE MICROSCOPIC-ADD ON

## 2012-12-02 LAB — COMPREHENSIVE METABOLIC PANEL
ALT: 46 U/L (ref 0–53)
AST: 45 U/L — ABNORMAL HIGH (ref 0–37)
Albumin: 3.6 g/dL (ref 3.5–5.2)
Alkaline Phosphatase: 96 U/L (ref 39–117)
BUN: 15 mg/dL (ref 6–23)
CO2: 23 mEq/L (ref 19–32)
Calcium: 9.3 mg/dL (ref 8.4–10.5)
Chloride: 107 mEq/L (ref 96–112)
Creatinine, Ser: 0.84 mg/dL (ref 0.50–1.35)
GFR calc Af Amer: >90 mL/min (ref >90)
GFR calc non Af Amer: >90 mL/min (ref >90)
Glucose, Bld: 107 mg/dL — ABNORMAL HIGH (ref 70–99)
Potassium: 3.4 mEq/L — ABNORMAL LOW (ref 3.5–5.1)
Sodium: 141 mEq/L (ref 135–145)
Total Bilirubin: 0.6 mg/dL (ref 0.3–1.2)
Total Protein: 7.5 g/dL (ref 6.0–8.3)

## 2012-12-02 LAB — URINALYSIS, ROUTINE W REFLEX MICROSCOPIC
Glucose, UA: NEGATIVE mg/dL
Hgb urine dipstick: NEGATIVE
Ketones, ur: NEGATIVE mg/dL
Nitrite: NEGATIVE
Protein, ur: NEGATIVE mg/dL
Specific Gravity, Urine: 1.026 (ref 1.005–1.030)
Urobilinogen, UA: 1 mg/dL (ref 0.0–1.0)
pH: 5.5 (ref 5.0–8.0)

## 2012-12-02 LAB — APTT: aPTT: 32 seconds (ref 24–37)

## 2012-12-02 LAB — PROTIME-INR
INR: 0.94 (ref 0.00–1.49)
Prothrombin Time: 12.4 seconds (ref 11.6–15.2)

## 2012-12-02 LAB — BLOOD GAS, ARTERIAL
Acid-base deficit: 0.4 mmol/L (ref 0.0–2.0)
Bicarbonate: 23.9 mEq/L (ref 20.0–24.0)
Drawn by: 206361
FIO2: 0.21 %
O2 Saturation: 97.5 %
Patient temperature: 98.6
TCO2: 25.1 mmol/L (ref 0–100)
pCO2 arterial: 40.2 mmHg (ref 35.0–45.0)
pH, Arterial: 7.392 (ref 7.350–7.450)
pO2, Arterial: 88.4 mmHg (ref 80.0–100.0)

## 2012-12-02 LAB — TYPE AND SCREEN
ABO/RH(D): O POS
Antibody Screen: NEGATIVE

## 2012-12-02 LAB — CBC
HCT: 45.4 % (ref 39.0–52.0)
Hemoglobin: 15.4 g/dL (ref 13.0–17.0)
MCH: 29 pg (ref 26.0–34.0)
MCHC: 33.9 g/dL (ref 30.0–36.0)
MCV: 85.5 fL (ref 78.0–100.0)
Platelets: 234 10*3/uL (ref 150–400)
RBC: 5.31 MIL/uL (ref 4.22–5.81)
RDW: 14.2 % (ref 11.5–15.5)
WBC: 7.7 10*3/uL (ref 4.0–10.5)

## 2012-12-02 LAB — SURGICAL PCR SCREEN
MRSA, PCR: NEGATIVE
Staphylococcus aureus: NEGATIVE

## 2012-12-02 LAB — ABO/RH: ABO/RH(D): O POS

## 2012-12-02 NOTE — Progress Notes (Signed)
Pt denies SOB, chest pain, and being under the care of a cardiologist. Pt states that he had a tress test over 10 years ago" around 2000."

## 2012-12-02 NOTE — Pre-Procedure Instructions (Signed)
Demetrio Leighty  12/02/2012   Your procedure is scheduled on:  Wednesday, December 04, 2012  Report to [facility] at 8:30 AM.  Call this number if you have problems the morning of surgery: [phone]   Remember:   Do not eat food or drink liquids after midnight.   Take these medicines the morning of surgery with A SIP OF WATER: ARIPiprazole (ABILIFY) 5 MG tablet, citalopram (CELEXA) 20 MG tablet, if needed: esomeprazole (NEXIUM) 40 MG capsule for heartburn, HYDROcodone-acetaminophen (NORCO) 10-325 MG per tablet for  pain Stop taking Aspirin and herbal medications. Do not take any NSAIDs ie: Ibuprofen, Advil, Naproxen or any medication containing Aspirin.  Do not wear jewelry, make-up or nail polish.  Do not wear lotions, powders, or perfumes. You may NOT wear deodorant.  Do not shave 48 hours prior to surgery. Men may shave face and neck.  Do not bring valuables to the hospital.  [facility] is not responsible for any belongings or valuables.  Contacts, dentures or bridgework may not be worn into surgery.  Leave suitcase in the car. After surgery it may be brought to your room.  For patients admitted to the hospital, checkout time is 11:00 AM the day of discharge.   Patients discharged the day of surgery will not be allowed to drive home.  Name and phone number of your driver:   Special Instructions: Shower using CHG 2 nights before surgery and the night before surgery.  If you shower the day of surgery use CHG.  Use special wash - you have one bottle of CHG for all showers.  You should use approximately 1/3 of the bottle for each shower.   Please read over the following fact sheets that you were given: Pain Booklet, Coughing and Deep Breathing, Blood Transfusion Information, MRSA Information and Surgical Site Infection Prevention

## 2012-12-03 ENCOUNTER — Telehealth: Payer: Self-pay | Admitting: *Deleted

## 2012-12-03 MED ORDER — CEFAZOLIN SODIUM-DEXTROSE 2-3 GM-% IV SOLR: 2.0000 g | INTRAVENOUS | Status: DC

## 2012-12-03 MED ORDER — DEXTROSE 5 % IV SOLN
1.5000 g | INTRAVENOUS | Status: AC
  Administered 2012-12-04: 1.5 g via INTRAVENOUS
  Filled 2012-12-03: qty 1.5

## 2012-12-03 NOTE — Telephone Encounter (Signed)
I called patient to check in prior to his surgery tomorrow.  Patient reported that he was confused about his surgical procedure after his pre-admission visit yesterday.  He was confused because the procedure on the consent form seemed different from the discussion he had with Dr. Tyrone Sage in the office.  He reported that the nurse did not have him sign the consent so that he can discuss it with Dr. Tyrone Sage prior to surgery. I reviewed the procedure with him and explained how the posted procedure was the same as what Dr. Tyrone Sage described.  Patient was able to teach back the information and verbalized understanding.  Patient denied any other questions or concerns at this time.  I encouraged him to call me for any needs.

## 2012-12-04 ENCOUNTER — Inpatient Hospital Stay (HOSPITAL_COMMUNITY): Payer: Medicaid Other | Admitting: Certified Registered"

## 2012-12-04 ENCOUNTER — Inpatient Hospital Stay (HOSPITAL_COMMUNITY): Payer: Medicaid Other

## 2012-12-04 ENCOUNTER — Encounter (HOSPITAL_COMMUNITY): Payer: Self-pay | Admitting: *Deleted

## 2012-12-04 ENCOUNTER — Encounter (HOSPITAL_COMMUNITY): Payer: Self-pay | Admitting: Certified Registered"

## 2012-12-04 ENCOUNTER — Inpatient Hospital Stay (HOSPITAL_COMMUNITY)
Admission: RE | Admit: 2012-12-04 | Discharge: 2012-12-09 | DRG: 164 | Disposition: A | Discharge status: Home / Self Care | Payer: Medicaid Other | Source: Ambulatory Visit | Attending: Cardiothoracic Surgery | Admitting: Cardiothoracic Surgery

## 2012-12-04 ENCOUNTER — Encounter (HOSPITAL_COMMUNITY)
Admission: RE | Disposition: A | Discharge status: Home / Self Care | Payer: Self-pay | Source: Ambulatory Visit | Attending: Cardiothoracic Surgery

## 2012-12-04 DIAGNOSIS — F411 Generalized anxiety disorder: Secondary | ICD-10-CM | POA: Diagnosis present

## 2012-12-04 DIAGNOSIS — D62 Acute posthemorrhagic anemia: Secondary | ICD-10-CM | POA: Diagnosis not present

## 2012-12-04 DIAGNOSIS — D381 Neoplasm of uncertain behavior of trachea, bronchus and lung: Secondary | ICD-10-CM

## 2012-12-04 DIAGNOSIS — E876 Hypokalemia: Secondary | ICD-10-CM | POA: Diagnosis not present

## 2012-12-04 DIAGNOSIS — F17200 Nicotine dependence, unspecified, uncomplicated: Secondary | ICD-10-CM | POA: Diagnosis present

## 2012-12-04 DIAGNOSIS — C3410 Malignant neoplasm of upper lobe, unspecified bronchus or lung: Secondary | ICD-10-CM | POA: Diagnosis present

## 2012-12-04 DIAGNOSIS — R404 Transient alteration of awareness: Secondary | ICD-10-CM | POA: Diagnosis present

## 2012-12-04 DIAGNOSIS — K219 Gastro-esophageal reflux disease without esophagitis: Secondary | ICD-10-CM | POA: Diagnosis present

## 2012-12-04 DIAGNOSIS — E049 Nontoxic goiter, unspecified: Secondary | ICD-10-CM | POA: Diagnosis present

## 2012-12-04 DIAGNOSIS — J9382 Other air leak: Secondary | ICD-10-CM | POA: Diagnosis not present

## 2012-12-04 DIAGNOSIS — F3289 Other specified depressive episodes: Secondary | ICD-10-CM | POA: Diagnosis present

## 2012-12-04 DIAGNOSIS — F329 Major depressive disorder, single episode, unspecified: Secondary | ICD-10-CM | POA: Diagnosis present

## 2012-12-04 DIAGNOSIS — C3490 Malignant neoplasm of unspecified part of unspecified bronchus or lung: Principal | ICD-10-CM | POA: Diagnosis present

## 2012-12-04 HISTORY — PX: VIDEO ASSISTED THORACOSCOPY (VATS)/WEDGE RESECTION: SHX6174

## 2012-12-04 HISTORY — PX: VIDEO BRONCHOSCOPY: SHX5072

## 2012-12-04 HISTORY — PX: LOBECTOMY: SHX5089

## 2012-12-04 SURGERY — BRONCHOSCOPY, VIDEO-ASSISTED
Anesthesia: General | Site: Chest | Wound class: Clean Contaminated

## 2012-12-04 MED ORDER — OXYCODONE HCL 5 MG PO TABS
5.0000 mg | ORAL_TABLET | ORAL | Status: AC | PRN
  Administered 2012-12-05 (×2): 10 mg via ORAL
  Filled 2012-12-04 (×3): qty 2

## 2012-12-04 MED ORDER — ONDANSETRON HCL 4 MG/2ML IJ SOLN: 4.0000 mg | Freq: Four times a day (QID) | INTRAMUSCULAR | Status: DC | PRN

## 2012-12-04 MED ORDER — PHENYLEPHRINE HCL 10 MG/ML IJ SOLN
INTRAMUSCULAR | Status: DC | PRN
  Administered 2012-12-04 (×8): 40 ug via INTRAVENOUS
  Administered 2012-12-04: 80 ug via INTRAVENOUS

## 2012-12-04 MED ORDER — MORPHINE SULFATE 10 MG/ML IJ SOLN
INTRAMUSCULAR | Status: DC | PRN
  Administered 2012-12-04 (×2): 5 mg via INTRAVENOUS

## 2012-12-04 MED ORDER — NEOSTIGMINE METHYLSULFATE 1 MG/ML IJ SOLN
INTRAMUSCULAR | Status: DC | PRN
  Administered 2012-12-04: 4 mg via INTRAVENOUS

## 2012-12-04 MED ORDER — BUPIVACAINE 0.5 % ON-Q PUMP SINGLE CATH 400 ML
400.0000 mL | INJECTION | Status: DC
  Filled 2012-12-04: qty 400

## 2012-12-04 MED ORDER — FENTANYL CITRATE 0.05 MG/ML IJ SOLN: 50.0000 ug | INTRAMUSCULAR | Status: DC | PRN

## 2012-12-04 MED ORDER — DEXTROSE-NACL 5-0.45 % IV SOLN
INTRAVENOUS | Status: DC
  Administered 2012-12-05: 50 mL/h via INTRAVENOUS
  Administered 2012-12-06 – 2012-12-07 (×3): via INTRAVENOUS

## 2012-12-04 MED ORDER — BUPIVACAINE 0.5 % ON-Q PUMP SINGLE CATH 400 ML
INJECTION | Status: DC | PRN
  Administered 2012-12-04: 400 mL

## 2012-12-04 MED ORDER — LEVALBUTEROL HCL 0.63 MG/3ML IN NEBU
0.6300 mg | INHALATION_SOLUTION | Freq: Four times a day (QID) | RESPIRATORY_TRACT | Status: DC
  Administered 2012-12-04 – 2012-12-05 (×4): 0.63 mg via RESPIRATORY_TRACT
  Filled 2012-12-04 (×8): qty 3

## 2012-12-04 MED ORDER — FENTANYL 10 MCG/ML IV SOLN
INTRAVENOUS | Status: DC
  Administered 2012-12-04: 18:00:00 via INTRAVENOUS
  Administered 2012-12-04: 45 ug via INTRAVENOUS
  Administered 2012-12-05: 15 ug via INTRAVENOUS
  Administered 2012-12-05 (×2): 75 ug via INTRAVENOUS
  Administered 2012-12-05: 120 ug via INTRAVENOUS
  Administered 2012-12-05: 14:00:00 via INTRAVENOUS
  Administered 2012-12-05: 75 ug via INTRAVENOUS
  Administered 2012-12-05: 30 ug via INTRAVENOUS
  Administered 2012-12-05: 60 ug via INTRAVENOUS
  Administered 2012-12-06 (×2): 45 ug via INTRAVENOUS
  Administered 2012-12-06 (×2): 30 ug via INTRAVENOUS
  Administered 2012-12-07: 75 ug via INTRAVENOUS
  Administered 2012-12-07 (×2): 15 ug via INTRAVENOUS
  Administered 2012-12-07 (×2): 45 ug via INTRAVENOUS
  Administered 2012-12-08: 15 ug via INTRAVENOUS
  Administered 2012-12-08: 0.3 ug via INTRAVENOUS
  Administered 2012-12-08: 07:00:00 via INTRAVENOUS
  Filled 2012-12-04 (×4): qty 50

## 2012-12-04 MED ORDER — PROPOFOL 10 MG/ML IV BOLUS
INTRAVENOUS | Status: DC | PRN
  Administered 2012-12-04: 200 mg via INTRAVENOUS
  Administered 2012-12-04: 30 mg via INTRAVENOUS

## 2012-12-04 MED ORDER — ACETAMINOPHEN 500 MG PO TABS
1000.0000 mg | ORAL_TABLET | Freq: Four times a day (QID) | ORAL | Status: AC
  Administered 2012-12-05 (×3): 1000 mg via ORAL
  Filled 2012-12-04 (×3): qty 2

## 2012-12-04 MED ORDER — ALBUMIN HUMAN 5 % IV SOLN
INTRAVENOUS | Status: DC | PRN
  Administered 2012-12-04: 16:00:00 via INTRAVENOUS

## 2012-12-04 MED ORDER — ARIPIPRAZOLE 5 MG PO TABS
5.0000 mg | ORAL_TABLET | Freq: Every morning | ORAL | Status: DC
  Administered 2012-12-06 – 2012-12-09 (×4): 5 mg via ORAL
  Filled 2012-12-04 (×5): qty 1

## 2012-12-04 MED ORDER — OXYCODONE-ACETAMINOPHEN 5-325 MG PO TABS
1.0000 | ORAL_TABLET | ORAL | Status: DC | PRN
  Administered 2012-12-05: 2 via ORAL
  Administered 2012-12-06 (×3): 1 via ORAL
  Administered 2012-12-07 – 2012-12-09 (×7): 2 via ORAL
  Filled 2012-12-04 (×5): qty 2
  Filled 2012-12-04: qty 1
  Filled 2012-12-04: qty 2
  Filled 2012-12-04: qty 1
  Filled 2012-12-04 (×2): qty 2
  Filled 2012-12-04: qty 1

## 2012-12-04 MED ORDER — LIDOCAINE HCL (CARDIAC) 20 MG/ML IV SOLN
INTRAVENOUS | Status: DC | PRN
  Administered 2012-12-04: 40 mg via INTRAVENOUS

## 2012-12-04 MED ORDER — ACETAMINOPHEN 160 MG/5ML PO SOLN: 1000.0000 mg | Freq: Four times a day (QID) | ORAL | Status: AC

## 2012-12-04 MED ORDER — MIDAZOLAM HCL 2 MG/2ML IJ SOLN: 1.0000 mg | INTRAMUSCULAR | Status: DC | PRN

## 2012-12-04 MED ORDER — HEMOSTATIC AGENTS (NO CHARGE) OPTIME
TOPICAL | Status: DC | PRN
  Administered 2012-12-04: 1 via TOPICAL

## 2012-12-04 MED ORDER — GLYCOPYRROLATE 0.2 MG/ML IJ SOLN
INTRAMUSCULAR | Status: DC | PRN
  Administered 2012-12-04: .6 mg via INTRAVENOUS
  Administered 2012-12-04: 0.2 mg via INTRAVENOUS

## 2012-12-04 MED ORDER — 0.9 % SODIUM CHLORIDE (POUR BTL) OPTIME
TOPICAL | Status: DC | PRN
  Administered 2012-12-04: 2000 mL

## 2012-12-04 MED ORDER — DIPHENHYDRAMINE HCL 12.5 MG/5ML PO ELIX
12.5000 mg | ORAL_SOLUTION | Freq: Four times a day (QID) | ORAL | Status: DC | PRN
  Filled 2012-12-04: qty 5

## 2012-12-04 MED ORDER — DEXTROSE 5 % IV SOLN
1.5000 g | Freq: Two times a day (BID) | INTRAVENOUS | Status: AC
  Administered 2012-12-04 – 2012-12-05 (×2): 1.5 g via INTRAVENOUS
  Filled 2012-12-04 (×2): qty 1.5

## 2012-12-04 MED ORDER — CITALOPRAM HYDROBROMIDE 20 MG PO TABS
20.0000 mg | ORAL_TABLET | Freq: Every morning | ORAL | Status: DC
  Administered 2012-12-05 – 2012-12-09 (×5): 20 mg via ORAL
  Filled 2012-12-04 (×5): qty 1

## 2012-12-04 MED ORDER — VECURONIUM BROMIDE 10 MG IV SOLR
INTRAVENOUS | Status: DC | PRN
  Administered 2012-12-04 (×2): 1 mg via INTRAVENOUS

## 2012-12-04 MED ORDER — NALOXONE HCL 0.4 MG/ML IJ SOLN: 0.4000 mg | INTRAMUSCULAR | Status: DC | PRN

## 2012-12-04 MED ORDER — MIDAZOLAM HCL 2 MG/2ML IJ SOLN
INTRAMUSCULAR | Status: AC
  Administered 2012-12-04: 1.5 mg via INTRAVENOUS
  Filled 2012-12-04: qty 2

## 2012-12-04 MED ORDER — SENNOSIDES-DOCUSATE SODIUM 8.6-50 MG PO TABS
1.0000 | ORAL_TABLET | Freq: Every evening | ORAL | Status: DC | PRN
  Filled 2012-12-04: qty 1

## 2012-12-04 MED ORDER — FENTANYL CITRATE 0.05 MG/ML IJ SOLN
INTRAMUSCULAR | Status: DC | PRN
  Administered 2012-12-04 (×2): 100 ug via INTRAVENOUS
  Administered 2012-12-04 (×2): 50 ug via INTRAVENOUS
  Administered 2012-12-04 (×2): 100 ug via INTRAVENOUS
  Administered 2012-12-04 (×3): 50 ug via INTRAVENOUS

## 2012-12-04 MED ORDER — POTASSIUM CHLORIDE 10 MEQ/50ML IV SOLN: 10.0000 meq | Freq: Every day | INTRAVENOUS | Status: DC | PRN

## 2012-12-04 MED ORDER — ONDANSETRON HCL 4 MG/2ML IJ SOLN
INTRAMUSCULAR | Status: DC | PRN
  Administered 2012-12-04: 4 mg via INTRAVENOUS

## 2012-12-04 MED ORDER — SODIUM CHLORIDE 0.9 % IJ SOLN: 9.0000 mL | INTRAMUSCULAR | Status: DC | PRN

## 2012-12-04 MED ORDER — DIPHENHYDRAMINE HCL 50 MG/ML IJ SOLN: 12.5000 mg | Freq: Four times a day (QID) | INTRAMUSCULAR | Status: DC | PRN

## 2012-12-04 MED ORDER — BISACODYL 5 MG PO TBEC
10.0000 mg | DELAYED_RELEASE_TABLET | Freq: Every day | ORAL | Status: DC
  Administered 2012-12-05 – 2012-12-08 (×4): 10 mg via ORAL
  Filled 2012-12-04 (×4): qty 2

## 2012-12-04 MED ORDER — LACTATED RINGERS IV SOLN
INTRAVENOUS | Status: DC | PRN
  Administered 2012-12-04: 11:00:00 via INTRAVENOUS

## 2012-12-04 MED ORDER — PANTOPRAZOLE SODIUM 40 MG PO TBEC
80.0000 mg | DELAYED_RELEASE_TABLET | Freq: Every day | ORAL | Status: DC
  Administered 2012-12-05 – 2012-12-08 (×3): 80 mg via ORAL
  Filled 2012-12-04: qty 1
  Filled 2012-12-04 (×3): qty 2

## 2012-12-04 MED ORDER — ROCURONIUM BROMIDE 100 MG/10ML IV SOLN
INTRAVENOUS | Status: DC | PRN
  Administered 2012-12-04: 50 mg via INTRAVENOUS

## 2012-12-04 MED ORDER — FENTANYL CITRATE 0.05 MG/ML IJ SOLN: 25.0000 ug | INTRAMUSCULAR | Status: DC | PRN

## 2012-12-04 MED ORDER — TRAZODONE HCL 50 MG PO TABS
50.0000 mg | ORAL_TABLET | Freq: Every day | ORAL | Status: DC
  Administered 2012-12-04 – 2012-12-08 (×2): 50 mg via ORAL
  Filled 2012-12-04 (×6): qty 1

## 2012-12-04 MED ORDER — FENTANYL CITRATE 0.05 MG/ML IJ SOLN: 50.0000 ug | Freq: Once | INTRAMUSCULAR | Status: DC

## 2012-12-04 MED ORDER — LACTATED RINGERS IV SOLN
INTRAVENOUS | Status: DC | PRN
  Administered 2012-12-04: 08:00:00 via INTRAVENOUS

## 2012-12-04 MED ORDER — PHENYLEPHRINE HCL 10 MG/ML IJ SOLN
10.0000 mg | INTRAVENOUS | Status: DC | PRN
  Administered 2012-12-04: 20 ug/min via INTRAVENOUS

## 2012-12-04 MED ORDER — FENTANYL CITRATE 0.05 MG/ML IJ SOLN
INTRAMUSCULAR | Status: AC
  Administered 2012-12-04: 75 ug via INTRAVENOUS
  Filled 2012-12-04: qty 2

## 2012-12-04 MED ORDER — LACTATED RINGERS IV SOLN
INTRAVENOUS | Status: DC
  Administered 2012-12-04: 08:00:00 via INTRAVENOUS

## 2012-12-04 MED ORDER — SUCCINYLCHOLINE CHLORIDE 20 MG/ML IJ SOLN
INTRAMUSCULAR | Status: DC | PRN
  Administered 2012-12-04: 100 mg via INTRAVENOUS

## 2012-12-04 SURGICAL SUPPLY — 87 items
APPLICATOR TIP COSEAL (VASCULAR PRODUCTS) IMPLANT
APPLICATOR TIP EXT COSEAL (VASCULAR PRODUCTS) IMPLANT
BLADE SURG 11 STRL SS (BLADE) IMPLANT
BRUSH CYTOL CELLEBRITY 1.5X140 (MISCELLANEOUS) IMPLANT
CANISTER SUCTION 2500CC (MISCELLANEOUS) ×3 IMPLANT
CATH KIT ON Q 5IN SLV (PAIN MANAGEMENT) ×3 IMPLANT
CATH THORACIC 28FR (CATHETERS) ×6 IMPLANT
CATH THORACIC 36FR (CATHETERS) IMPLANT
CATH THORACIC 36FR RT ANG (CATHETERS) IMPLANT
CLIP TI LARGE 6 (CLIP) ×6 IMPLANT
CLIP TI MEDIUM 6 (CLIP) IMPLANT
CLOTH BEACON ORANGE TIMEOUT ST (SAFETY) IMPLANT
CONN ST 1/4X3/8  BEN (MISCELLANEOUS)
CONN ST 1/4X3/8 BEN (MISCELLANEOUS) IMPLANT
CONN Y 3/8X3/8X3/8  BEN (MISCELLANEOUS) ×1
CONN Y 3/8X3/8X3/8 BEN (MISCELLANEOUS) ×2 IMPLANT
CONT SPEC 4OZ CLIKSEAL STRL BL (MISCELLANEOUS) ×12 IMPLANT
COVER TABLE BACK 60X90 (DRAPES) ×3 IMPLANT
DERMABOND ADHESIVE PROPEN (GAUZE/BANDAGES/DRESSINGS) ×1
DERMABOND ADVANCED .7 DNX6 (GAUZE/BANDAGES/DRESSINGS) ×2 IMPLANT
DRAPE LAPAROSCOPIC ABDOMINAL (DRAPES) ×3 IMPLANT
DRAPE SLUSH/WARMER DISC (DRAPES) IMPLANT
DRAPE WARM FLUID 44X44 (DRAPE) ×3 IMPLANT
DRILL BIT 7/64X5 (BIT) ×3 IMPLANT
ELECT BLADE 4.0 EZ CLEAN MEGAD (MISCELLANEOUS) ×3
ELECT REM PT RETURN 9FT ADLT (ELECTROSURGICAL) ×3
ELECTRODE BLDE 4.0 EZ CLN MEGD (MISCELLANEOUS) ×2 IMPLANT
ELECTRODE REM PT RTRN 9FT ADLT (ELECTROSURGICAL) ×2 IMPLANT
FORCEPS BIOP RJ4 1.8 (CUTTING FORCEPS) IMPLANT
GLOVE BIO SURGEON STRL SZ 6.5 (GLOVE) ×6 IMPLANT
GLOVE BIOGEL PI IND STRL 6 (GLOVE) ×2 IMPLANT
GLOVE BIOGEL PI INDICATOR 6 (GLOVE) ×1
GOWN STRL NON-REIN LRG LVL3 (GOWN DISPOSABLE) ×12 IMPLANT
HANDLE STAPLE ENDO GIA SHORT (STAPLE) ×1
KIT BASIN OR (CUSTOM PROCEDURE TRAY) ×3 IMPLANT
KIT ROOM TURNOVER OR (KITS) ×3 IMPLANT
KIT SUCTION CATH 14FR (SUCTIONS) ×3 IMPLANT
MARKER SKIN DUAL TIP RULER LAB (MISCELLANEOUS) ×3 IMPLANT
NEEDLE BIOPSY TRANSBRONCH 21G (NEEDLE) IMPLANT
NS IRRIG 1000ML POUR BTL (IV SOLUTION) ×6 IMPLANT
OIL SILICONE PENTAX (PARTS (SERVICE/REPAIRS)) ×3 IMPLANT
PACK CHEST (CUSTOM PROCEDURE TRAY) ×3 IMPLANT
PAD ARMBOARD 7.5X6 YLW CONV (MISCELLANEOUS) ×6 IMPLANT
PASSER SUT SWANSON 36MM LOOP (INSTRUMENTS) ×3 IMPLANT
RELOAD EGIA 45 MED/THCK PURPLE (STAPLE) ×15 IMPLANT
RELOAD EGIA BLACK ROTIC 45MM (STAPLE) ×3 IMPLANT
RELOAD EGIA TRIS TAN 45 CVD (STAPLE) ×15 IMPLANT
SCISSORS LAP 5X35 DISP (ENDOMECHANICALS) IMPLANT
SEALANT PROGEL (MISCELLANEOUS) IMPLANT
SEALANT SURG COSEAL 4ML (VASCULAR PRODUCTS) IMPLANT
SEALANT SURG COSEAL 8ML (VASCULAR PRODUCTS) ×3 IMPLANT
SOLUTION ANTI FOG 6CC (MISCELLANEOUS) ×3 IMPLANT
SPONGE GAUZE 4X4 12PLY (GAUZE/BANDAGES/DRESSINGS) ×3 IMPLANT
SPONGE INTESTINAL PEANUT (DISPOSABLE) ×3 IMPLANT
STAPLER ENDO GIA 12MM SHORT (STAPLE) ×2 IMPLANT
SUT PROLENE 3 0 SH DA (SUTURE) IMPLANT
SUT PROLENE 4 0 RB 1 (SUTURE)
SUT PROLENE 4-0 RB1 .5 CRCL 36 (SUTURE) IMPLANT
SUT SILK  1 MH (SUTURE) ×2
SUT SILK 1 MH (SUTURE) ×4 IMPLANT
SUT SILK 2 0 SH (SUTURE) IMPLANT
SUT SILK 2 0 SH CR/8 (SUTURE) ×3 IMPLANT
SUT SILK 2 0SH CR/8 30 (SUTURE) IMPLANT
SUT SILK 3 0SH CR/8 30 (SUTURE) IMPLANT
SUT STEEL 1 (SUTURE) IMPLANT
SUT VIC AB 1 CTX 18 (SUTURE) ×3 IMPLANT
SUT VIC AB 1 CTX 36 (SUTURE)
SUT VIC AB 1 CTX36XBRD ANBCTR (SUTURE) IMPLANT
SUT VIC AB 2-0 CTX 36 (SUTURE) ×3 IMPLANT
SUT VIC AB 2-0 UR6 27 (SUTURE) IMPLANT
SUT VIC AB 3-0 SH 8-18 (SUTURE) IMPLANT
SUT VIC AB 3-0 X1 27 (SUTURE) ×3 IMPLANT
SUT VICRYL 2 TP 1 (SUTURE) ×3 IMPLANT
SWAB COLLECTION DEVICE MRSA (MISCELLANEOUS) IMPLANT
SYR 20ML ECCENTRIC (SYRINGE) ×3 IMPLANT
SYSTEM SAHARA CHEST DRAIN ATS (WOUND CARE) ×3 IMPLANT
TAPE CLOTH SURG 4X10 WHT LF (GAUZE/BANDAGES/DRESSINGS) ×3 IMPLANT
TAPE UMBILICAL COTTON 1/8X30 (MISCELLANEOUS) ×3 IMPLANT
TIP APPLICATOR SPRAY EXTEND 16 (VASCULAR PRODUCTS) IMPLANT
TOWEL OR 17X24 6PK STRL BLUE (TOWEL DISPOSABLE) ×6 IMPLANT
TOWEL OR 17X26 10 PK STRL BLUE (TOWEL DISPOSABLE) ×6 IMPLANT
TRAP SPECIMEN MUCOUS 40CC (MISCELLANEOUS) ×3 IMPLANT
TRAY FOLEY CATH 14FRSI W/METER (CATHETERS) ×3 IMPLANT
TUBE ANAEROBIC SPECIMEN COL (MISCELLANEOUS) IMPLANT
TUBE CONNECTING 12X1/4 (SUCTIONS) ×6 IMPLANT
TUNNELER SHEATH ON-Q 11GX8 (MISCELLANEOUS) ×3 IMPLANT
WATER STERILE IRR 1000ML POUR (IV SOLUTION) ×6 IMPLANT
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
## 2012-12-04 NOTE — Anesthesia Preprocedure Evaluation (Addendum)
Anesthesia Evaluation  Patient identified by MRN, date of birth, ID band Patient awake    Reviewed: Allergy & Precautions, H&P , NPO status , Patient's Chart, lab work & pertinent test results  Airway Mallampati: II TM Distance: >3 FB Neck ROM: Full    Dental  (+) Edentulous Upper and Edentulous Lower   Pulmonary asthma , pneumonia -,  breath sounds clear to auscultation        Cardiovascular negative cardio ROS  Rhythm:Regular Rate:Normal     Neuro/Psych Anxiety Depression    GI/Hepatic GERD-  Medicated and Controlled,  Endo/Other    Renal/GU      Musculoskeletal   Abdominal   Peds  Hematology   Anesthesia Other Findings   Reproductive/Obstetrics                         Anesthesia Physical Anesthesia Plan  ASA: III  Anesthesia Plan: General   Post-op Pain Management:    Induction: Intravenous  Airway Management Planned: Double Lumen EBT  Additional Equipment: Arterial line and CVP  Intra-op Plan:   Post-operative Plan: Possible Post-op intubation/ventilation  Informed Consent: I have reviewed the patients History and Physical, chart, labs and discussed the procedure including the risks, benefits and alternatives for the proposed anesthesia with the patient or authorized representative who has indicated his/her understanding and acceptance.   Dental advisory given  Plan Discussed with: CRNA, Anesthesiologist and Surgeon  Anesthesia Plan Comments:        Anesthesia Quick Evaluation
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
## 2012-12-04 NOTE — Progress Notes (Signed)
CRNAs at bedside for A-line. Pt easily arousable, VSS.

## 2012-12-04 NOTE — Transfer of Care (Signed)
Immediate Anesthesia Transfer of Care Note  Patient: Edgar Ross  Procedure(s) Performed: Procedure(s): VIDEO BRONCHOSCOPY (N/A) VIDEO ASSISTED THORACOSCOPY (VATS)/WEDGE RESECTION LOBECTOMY  Patient Location: PACU  Anesthesia Type:General  Level of Consciousness: awake, alert  and responds to stimulation  Airway & Oxygen Therapy: Patient Spontanous Breathing and Patient connected to nasal cannula oxygen  Post-op Assessment: Report given to PACU RN and Post -op Vital signs reviewed and stable  Post vital signs: Reviewed and stable  Complications: No apparent anesthesia complications

## 2012-12-04 NOTE — Preoperative (Signed)
Beta Blockers   Reason not to administer Beta Blockers:Not Applicable 

## 2012-12-04 NOTE — Brief Op Note (Addendum)
12/04/2012  5:05 PM  PATIENT:  Edgar Ross  62 y.o. male  PRE-OPERATIVE DIAGNOSIS:  Left lung mass  POST-OPERATIVE DIAGNOSIS:  Adenocarcinoma  PROCEDURE:  Procedure(s): -VIDEO ASSISTED THORACOSCOPY/MINI THORACOTOMY -LOBECTOMY LEFT UPPER LOBE Placement of On Q Lymph node dissection VIDEO BRONCHOSCOPY (N/A)  SURGEON:  Surgeon(s) and Role:    * Delight Ovens, MD - Primary  PHYSICIAN ASSISTANT: [name] [name] PA-C  ANESTHESIA:   general  EBL:  Total I/O In: 1150 [I.V.:900; IV Piggyback:250] Out: 635 [Urine:435; Blood:200]  BLOOD ADMINISTERED:none  DRAINS: 2 28 Straight chest tube left chest   LOCAL MEDICATIONS USED:  NONE  SPECIMEN:  Source of Specimen:  Left Upper Lobe Lung  DISPOSITION OF SPECIMEN:  PATHOLOGY  COUNTS:  YES  DICTATION: .Dragon Dictation  PLAN OF CARE: Admit to inpatient   PATIENT DISPOSITION:  PACU - hemodynamically stable.   Delay start of Pharmacological VTE agent (>24hrs) due to surgical blood loss or risk of bleeding: yes

## 2012-12-04 NOTE — Anesthesia Procedure Notes (Addendum)
Procedure Name: Intubation Date/Time: 12/04/2012 1:02 PM Performed by: Arlice Colt B Pre-anesthesia Checklist: Patient identified, Emergency Drugs available, Suction available and Patient being monitored Patient Re-evaluated:Patient Re-evaluated prior to inductionOxygen Delivery Method: Circle system utilized Preoxygenation: Pre-oxygenation with 100% oxygen Intubation Type: IV induction Ventilation: Mask ventilation without difficulty and Oral airway inserted - appropriate to patient size Laryngoscope Size: Mac and 4 Grade View: Grade I Endobronchial tube: Double lumen EBT, EBT position confirmed by auscultation and EBT position confirmed by fiberoptic bronchoscope and 39 Fr Number of attempts: 1 Airway Equipment and Method: Stylet and Oral airway Placement Confirmation: ETT inserted through vocal cords under direct vision,  positive ETCO2 and breath sounds checked- equal and bilateral Tube secured with: Tape Dental Injury: Teeth and Oropharynx as per pre-operative assessment     LIJ CVP Dual Lumen 1015-1030: The patient was identified and consent obtained.  TO was performed, and full barrier precautions were used.  The skin was anesthetized with lidocaine.  Once the vein was located with the 22 ga. needle using ultrasound guidance , the wire was inserted into the vein.  The wire location was confirmed with ultrasound.  The tissue was dilated and the catheter was carefully inserted, then sutured in place. A dressing was applied. The patient tolerated the procedure well.   CE

## 2012-12-04 NOTE — Anesthesia Postprocedure Evaluation (Signed)
  Anesthesia Post-op Note  Patient: Edgar Ross  Procedure(s) Performed: Procedure(s): VIDEO BRONCHOSCOPY (N/A) VIDEO ASSISTED THORACOSCOPY (VATS)/WEDGE RESECTION LOBECTOMY  Patient Location: PACU  Anesthesia Type:General  Level of Consciousness: awake  Airway and Oxygen Therapy: Patient Spontanous Breathing  Post-op Pain: mild  Post-op Assessment: Post-op Vital signs reviewed  Post-op Vital Signs: Reviewed  Complications: No apparent anesthesia complications

## 2012-12-05 ENCOUNTER — Inpatient Hospital Stay (HOSPITAL_COMMUNITY): Payer: Medicaid Other

## 2012-12-05 ENCOUNTER — Encounter (HOSPITAL_COMMUNITY): Payer: Self-pay | Admitting: Cardiothoracic Surgery

## 2012-12-05 LAB — CBC
Hemoglobin: 13.9 g/dL (ref 13.0–17.0)
MCH: 30.2 pg (ref 26.0–34.0)
MCV: 85.9 fL (ref 78.0–100.0)
RBC: 4.61 MIL/uL (ref 4.22–5.81)

## 2012-12-05 LAB — POCT I-STAT 3, ART BLOOD GAS (G3+)
Bicarbonate: 27.5 mEq/L — ABNORMAL HIGH (ref 20.0–24.0)
O2 Saturation: 97 %
Patient temperature: 98.1
TCO2: 29 mmol/L (ref 0–100)
pCO2 arterial: 54 mmHg — ABNORMAL HIGH (ref 35.0–45.0)
pH, Arterial: 7.313 — ABNORMAL LOW (ref 7.350–7.450)
pO2, Arterial: 102 mmHg — ABNORMAL HIGH (ref 80.0–100.0)

## 2012-12-05 LAB — BASIC METABOLIC PANEL
CO2: 26 mEq/L (ref 19–32)
Calcium: 8.1 mg/dL — ABNORMAL LOW (ref 8.4–10.5)
Creatinine, Ser: 0.79 mg/dL (ref 0.50–1.35)
Glucose, Bld: 153 mg/dL — ABNORMAL HIGH (ref 70–99)

## 2012-12-05 MED ORDER — POTASSIUM CHLORIDE 10 MEQ/50ML IV SOLN
INTRAVENOUS | Status: AC
  Administered 2012-12-05: 10 meq via INTRAVENOUS
  Filled 2012-12-05: qty 150

## 2012-12-05 MED ORDER — LEVALBUTEROL HCL 0.63 MG/3ML IN NEBU
0.6300 mg | INHALATION_SOLUTION | Freq: Three times a day (TID) | RESPIRATORY_TRACT | Status: DC
  Administered 2012-12-06 – 2012-12-09 (×10): 0.63 mg via RESPIRATORY_TRACT
  Filled 2012-12-05 (×17): qty 3

## 2012-12-05 MED ORDER — POTASSIUM CHLORIDE 10 MEQ/50ML IV SOLN
10.0000 meq | INTRAVENOUS | Status: AC | PRN
  Administered 2012-12-05 (×2): 10 meq via INTRAVENOUS

## 2012-12-05 MED ORDER — ENOXAPARIN SODIUM 30 MG/0.3ML ~~LOC~~ SOLN
30.0000 mg | SUBCUTANEOUS | Status: DC
  Administered 2012-12-05 – 2012-12-09 (×5): 30 mg via SUBCUTANEOUS
  Filled 2012-12-05 (×6): qty 0.3

## 2012-12-05 MED ORDER — DOCUSATE SODIUM 100 MG PO CAPS
100.0000 mg | ORAL_CAPSULE | Freq: Two times a day (BID) | ORAL | Status: DC
  Administered 2012-12-05 – 2012-12-09 (×8): 100 mg via ORAL
  Filled 2012-12-05 (×9): qty 1

## 2012-12-05 NOTE — Progress Notes (Signed)
Utilization review completed. Jamell Laymon, RN, BSN. 

## 2012-12-05 NOTE — Progress Notes (Signed)
Pt arrived from 2311, VSS, wanted to sit in chair. Will continue to monitor.

## 2012-12-05 NOTE — Progress Notes (Addendum)
      [address]       Edgar [place] [address]             [phone]      1 Day Post-Op Procedure(s) (LRB): VIDEO BRONCHOSCOPY (N/A) VIDEO ASSISTED THORACOSCOPY (VATS)/WEDGE RESECTION LOBECTOMY  Subjective:  Patient states doing well.  He does have some incisional pain.  Objective: Vital signs in last 24 hours: Temp:  [97.2 F (36.2 C)-98.6 F (37 C)] 98.1 F (36.7 C) (08/07 0403) Pulse Rate:  [49-91] 69 (08/07 0700) Cardiac Rhythm:  [-] Normal sinus rhythm (08/07 0700) Resp:  [10-27] 18 (08/07 0700) BP: (112-160)/(63-102) 119/63 mmHg (08/07 0700) SpO2:  [97 %-100 %] 97 % (08/07 0700) Arterial Line BP: (124-184)/(65-92) 135/69 mmHg (08/07 0700) FiO2 (%):  [3 %] 3 % (08/06 1800)  Intake/Output from previous day: 08/06 0701 - 08/07 0700 In: 2472.5 [I.V.:2072.5; IV Piggyback:400] Out: 1588 [Urine:1100; Blood:200; Chest Tube:288]  General appearance: alert, cooperative and no distress Heart: regular rate and rhythm Lungs: clear to auscultation bilaterally Abdomen: soft, non-tender; bowel sounds normal; no masses,  no organomegaly Wound: clean and dry  Lab Results:  Recent Labs  12/02/12 1406 12/05/12 0420  WBC 7.7 12.7*  HGB 15.4 13.9  HCT 45.4 39.6  PLT 234 210   BMET:  Recent Labs  12/02/12 1406 12/05/12 0420  NA 141 137  K 3.4* 3.2*  CL 107 102  CO2 23 26  GLUCOSE 107* 153*  BUN 15 13  CREATININE 0.84 0.79  CALCIUM 9.3 8.1*    PT/INR:  Recent Labs  12/02/12 1406  LABPROT 12.4  INR 0.94   ABG    Component Value Date/Time   PHART 7.313* 12/05/2012 0407   HCO3 27.5* 12/05/2012 0407   TCO2 29 12/05/2012 0407   ACIDBASEDEF 0.4 12/02/2012 1406   O2SAT 97.0 12/05/2012 0407   CBG (last 3)  No results found for this basename: GLUCAP,  in the last 72 hours  Assessment/Plan: S/P Procedure(s) (LRB): VIDEO BRONCHOSCOPY (N/A) VIDEO ASSISTED THORACOSCOPY (VATS)/WEDGE RESECTION LOBECTOMY  1. Chest tube- small air leak with cough, 288 cc  output since surgery, CXR without pneumothorax- will leave chest tube to suction today 2. Pulm- wean oxygen as tolerated, instructed on good use of IS 3. CV- NSR rate and pressure controlled 4. Expected Blood Loss Anemia- Mild, Hgb stable at 13.9 5. D/C Foley 6. D/C Arterial Line 7. Decrease IVF 8. Dispo- patient stable, Frozen pathology in OR + for Adenocarcinoma final path pending, transfer to 3300   LOS: 1 day    [name], [name] 12/05/2012  Stable today, very small air leak with cough I have seen and examined Edgar Ross and agree with the above assessment  and plan.  Delight Ovens MD Beeper [phone] Office [phone] 12/05/2012 8:12 AM

## 2012-12-06 ENCOUNTER — Inpatient Hospital Stay (HOSPITAL_COMMUNITY): Payer: Medicaid Other

## 2012-12-06 LAB — COMPREHENSIVE METABOLIC PANEL
Albumin: 2.9 g/dL — ABNORMAL LOW (ref 3.5–5.2)
Alkaline Phosphatase: 65 U/L (ref 39–117)
BUN: 9 mg/dL (ref 6–23)
Chloride: 99 mEq/L (ref 96–112)
GFR calc Af Amer: >90 mL/min (ref >90)
Glucose, Bld: 108 mg/dL — ABNORMAL HIGH (ref 70–99)
Potassium: 3.5 mEq/L (ref 3.5–5.1)
Total Bilirubin: 0.8 mg/dL (ref 0.3–1.2)

## 2012-12-06 LAB — CBC
HCT: 37.8 % — ABNORMAL LOW (ref 39.0–52.0)
Hemoglobin: 13.1 g/dL (ref 13.0–17.0)
RBC: 4.42 MIL/uL (ref 4.22–5.81)
WBC: 10.5 10*3/uL (ref 4.0–10.5)

## 2012-12-06 MED ORDER — KETOROLAC TROMETHAMINE 60 MG/2ML IM SOLN
30.0000 mg | Freq: Four times a day (QID) | INTRAMUSCULAR | Status: DC | PRN
  Administered 2012-12-06 (×2): 30 mg via INTRAMUSCULAR
  Filled 2012-12-06 (×2): qty 2

## 2012-12-06 NOTE — Progress Notes (Addendum)
      [address]       [name] [address]             [phone]      2 Days Post-Op Procedure(s) (LRB): VIDEO BRONCHOSCOPY (N/A) VIDEO ASSISTED THORACOSCOPY (VATS)/WEDGE RESECTION LOBECTOMY  Subjective:  Mr. Valdez complains of pain this morning.  He is achieving minimal relief with use of Percocet.    Objective: Vital signs in last 24 hours: Temp:  [97.5 F (36.4 C)-99.6 F (37.6 C)] 98.3 F (36.8 C) (08/08 0807) Pulse Rate:  [56-90] 84 (08/08 0355) Cardiac Rhythm:  [-] Normal sinus rhythm (08/08 0355) Resp:  [12-19] 17 (08/08 0355) BP: (104-132)/(39-85) 121/66 mmHg (08/08 0355) SpO2:  [91 %-96 %] 95 % (08/08 0355)  Intake/Output from previous day: 08/07 0701 - 08/08 0700 In: 2302.5 [P.O.:1180; I.V.:1022.5; IV Piggyback:100] Out: 2150 [Urine:1775; Chest Tube:375]  General appearance: alert, cooperative and no distress Heart: regular rate and rhythm Lungs: clear to auscultation bilaterally Wound: clean and dry  Lab Results:  Recent Labs  12/05/12 0420 12/06/12 0438  WBC 12.7* 10.5  HGB 13.9 13.1  HCT 39.6 37.8*  PLT 210 173   BMET:  Recent Labs  12/05/12 0420 12/06/12 0438  NA 137 136  K 3.2* 3.5  CL 102 99  CO2 26 31  GLUCOSE 153* 108*  BUN 13 9  CREATININE 0.79 0.84  CALCIUM 8.1* 8.4    PT/INR: No results found for this basename: LABPROT, INR,  in the last 72 hours ABG    Component Value Date/Time   PHART 7.313* 12/05/2012 0407   HCO3 27.5* 12/05/2012 0407   TCO2 29 12/05/2012 0407   ACIDBASEDEF 0.4 12/02/2012 1406   O2SAT 97.0 12/05/2012 0407   CBG (last 3)  No results found for this basename: GLUCAP,  in the last 72 hours  Assessment/Plan: S/P Procedure(s) (LRB): VIDEO BRONCHOSCOPY (N/A) VIDEO ASSISTED THORACOSCOPY (VATS)/WEDGE RESECTION LOBECTOMY  1. Chest tube- minimal air leak with cough, CXR with no evidence of pneumothorax, 375 cc output since yesterday 2. Pulm- wean oxygen as tolerated, continue IS,breating  treatments 3. CV- NSR good rate and pressure controlled 4. Pain control- continue Percocet, will add Toradol creatinine is WNL 5. D/C IVF 6. Dispo- will discuss chest tube management with staff, air leak very small may be able to transition to water seal   LOS: 2 days    Lowella Dandy 12/06/2012   Path report discussed with patient, Stage IB ( pT2a(because of viseral pleural involvement), pN0, cM0) Chest tube to water seal I have seen and examined Kathryne Hitch and agree with the above assessment  and plan.  Delight Ovens MD Beeper [phone] Office [phone] 12/06/2012 6:43 PM

## 2012-12-06 NOTE — Discharge Summary (Signed)
[address]       [name] [address]             [phone]              Discharge Summary  Name: Edgar Ross DOB: 1950/11/08 62 y.o. 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 GERD (gastroesophageal reflux disease)   . Thyroid disease   . Trouble swallowing     hx of   . Lipoma of back   . Full dentures   . Anxiety   . Depression   . Pneumonia as child    hx of  . Asthma as child    no attacks  . Arthritis     right foot  . Tingling     Hx; of left arm     Procedures: VIDEO BRONCHOSCOPY LEFT VIDEO ASSISTED THORACOSCOPY/MINI-THORACOTOMY LEFT UPPER LOBECTOMY, LYMPH NODE DISSECTION - 12/04/2012   HPI:  The patient is a 62 y.o. male smoker who recently underwent a screening chest x-ray in preparation for thyroidectomy for thyroid mass by Dr. Derrell Lolling.  This revealed a 1.5 cm left upper lobe mass which was suspicious for malignancy.  In comparison, a chest x-ray from 2011 showed an 8 mm left upper lobe lesion.  A CT of the chest was performed which confirmed a 1.5 x 1.3 cm left upper lobe lesion. The patient was referred to Dr. Tyrone Sage for thoracic surgical evaluation.  A PET scan was performed which showed increased uptake in the mass, with an SUV of 6.9.  The lesion was felt to represent a primary lung cancer, and Dr. Tyrone Sage recommended proceeding with resection. All risks, benefits and alternatives of surgery were explained in detail, and the patient agreed to proceed.     Hospital Course:  The patient was admitted to [facility] on 12/04/2012. The patient was taken to the operating room and underwent the above procedure.    The postoperative course has generally been  uneventful.  His chest tubes have been removed in the standard fashion, and follow up chest x-rays have remained stable.  He is ambulating in the halls without difficulty and is tolerating a regular diet. Incisions are healing well.  He has remained afebrile, and vital signs are stable.  Final pathology revealed invasive adenocarcinoma, T2, N0. He is currently medically stable for discharge home.     Recent vital signs:  Filed Vitals:   12/06/12 0807  BP:   Pulse:   Temp: 98.3 F (36.8 C)  Resp:     Recent laboratory studies:  CBC: Recent Labs  12/05/12 0420 12/06/12 0438  WBC 12.7* 10.5  HGB 13.9 13.1  HCT 39.6 37.8*  PLT 210 173   BMET:  Recent Labs  12/05/12 0420 12/06/12 0438  NA 137 136  K 3.2* 3.5  CL 102 99  CO2 26 31  GLUCOSE 153* 108*  BUN 13 9  CREATININE 0.79 0.84  CALCIUM 8.1* 8.4    PT/INR: No results found for this basename: LABPROT, INR,  in the last 72 hours   Discharge Medications:     Medication List    STOP taking these medications       HYDROcodone-acetaminophen 10-325 MG per tablet  Commonly  known as:  NORCO      TAKE these medications       ARIPiprazole 5 MG tablet  Commonly known as:  ABILIFY  Take 5 mg by mouth every morning.     citalopram 20 MG tablet  Commonly known as:  CELEXA  Take 20 mg by mouth every morning.     esomeprazole 40 MG capsule  Commonly known as:  NEXIUM  Take 40 mg by mouth daily as needed (heartburn).     oxyCODONE-acetaminophen 5-325 MG per tablet  Commonly known as:  PERCOCET/ROXICET  Take 1-2 tablets by mouth every 4 (four) hours as needed.     traZODone 50 MG tablet  Commonly known as:  DESYREL  Take 50 mg by mouth at bedtime.        Discharge Instructions:  The patient is to refrain from driving, heavy lifting or strenuous activity.  May shower daily and clean incisions with soap and water.  May resume regular diet.   Follow Up: Follow-up Information   Follow up with GERHARDT,EDWARD  B, MD In 3 weeks. (Office will contact you with an appointment)    Contact information:   [address] [employer] [address] [place] [address] [phone]       Follow up with [facility] In 1 week. (Office will call you with an appointment for suture removal )       Follow up with [name], MD. (As directed)    Contact information:   [address] [place] [address] [phone]            Adella Hare 12/06/2012, 10:31 AM

## 2012-12-06 NOTE — Op Note (Signed)
NAMEAASIM, RESTIVO NO.:  [id number]  MEDICAL RECORD NO.:  [id number]  LOCATION:  3S02C                        FACILITY:  [facility]  PHYSICIAN:  [name], MD    DATE OF BIRTH:  1950-07-09  DATE OF PROCEDURE:  12/04/2012 DATE OF DISCHARGE:                              OPERATIVE REPORT   PREOPERATIVE DIAGNOSIS:  Left upper lobe lung nodule highly suspicious for malignancy.  POSTOPERATIVE DIAGNOSIS:  Left upper lobe lung nodule highly suspicious for malignancy.  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  PROCEDURE PERFORMED:  Bronchoscopy, left video-assisted thoracoscopy, mini thoracotomy, wedge resection of left upper lobe, completion of left upper lobectomy, and lymph node dissection and placement of On-Q device.  SURGEON:  [name], MD  FIRST ASSISTANT:  Pauline Good, PA  BRIEF HISTORY:  The patient is a 62 year old male who had noted a slowly growing left upper lobe lung mass.  He presented to Dr. Derrell Lolling for resection for thyroidectomy because of a very large goiter.  Preop chest x-ray showed left upper lobe lung nodule, which was further evaluated with a PET scan.  [name] lesion was markedly hypermetabolic as was this thyroid.  He had no other evidence of hypermetabolic activity. Surgical resection for presumed stage I carcinoma of the lung was recommended to the patient who agreed and signed informed consent.  DESCRIPTION OF PROCEDURE:  The patient underwent general endotracheal anesthesia with a double-lumen endotracheal tube.  In spite of his tracheal deviation and large thyroid intubation went smoothly.  After appropriate time-out,  a fiberoptic bronchoscope was passed through the endotracheal tube,to the subsegmental level bilaterally.  There were no endobronchial lesions appreciated.  A double-lumen endotracheal tube was in good position.  The scope was removed.  The patient was turned in the left lateral decubitus position with the  left side up and prepped and draped in the usual sterile manner.  In the 4th intercostal space proximally mid axillary line, the port incision was made.  The left lung was collapsed.  A 30-degree video scope was introduced into the chest, and pleural space examined without evidence of any pleural disease.  A port site incision was expanded to about 3 cm to allow palpation of the lung and became are working incision.  In the left upper lobe, there was palpable nodule using purple stapler.  The wedge resection was carried out.  Additional ports placed anteriorly and posteriorly in the chest to facilitate.  The specimen was sent to Pathology for frozen section, which confirmed adenocarcinoma with clear margins.  The patient has insufficient pulmonary function to tolerate a lobectomy, so we proceeded with left upper lobectomy.  The left upper lobe pulmonary vein was divided with a Gold Tip stapler.  Dissection was carried along the pulmonary artery and lingular pulmonary artery branches and upper lobe pulmonary artery branches.  A total of 4 were divided with the Gold Tip vascular stapler.  The fissure was completed with purple staplers and the left upper lobe bronchus was encircled with a umbilical tape.  Using a black stapler, the bronchus was stapled and the lower lobe was Inflated confirming proper positioning of the stapler.  The upper lobe bronchus  was divided and the specimen removed.  In addition, 4L, 10N, 11L lymph nodes were dissected separately. The bronchial stump was tested for air leak and was intact, CoSeal was placed on the stump. Through a separate site on the chest wall a dilator and sheath for placement of On-Q catheter was placed subpleurally along the posterior ribs under visual guidance. The catheter was secured in place. Two #28 chest tubes were left in place through the pre-existing port sites. The tubes were secured. The lung was re inflated, without difficulty. The  incision was closed with interrupted 0 Vicryl in the deep muscle layers, running 2-0 Vicryl in the subcutaneous tissue, and 3-0 Vicryl subcuticular stitch. Dermabond was placed. Sponge and needle count was reported as correct at the completion the procedure. Estimated blood loss approximately 100 mL's. The patient was awakened and extubated in the operating room and transferred to the recovery room for postoperative observation. He tolerated the procedure without discomfort dictation.     [name], MD     EG/MEDQ  D:  12/06/2012  T:  12/06/2012  Job:  [id number]

## 2012-12-06 NOTE — Progress Notes (Signed)
Brief Nutrition Note:  RD was pulled to pt for malnutrition screening tool report of unintentional weight loss and poor oral intake.  At time this pt denies any weight loss or poor appetite. Pt states his usual weight is 170 lbs.  . Wt Readings from Last 10 Encounters:  12/05/12 171 lb 8.3 oz (77.8 kg)  12/05/12 171 lb 8.3 oz (77.8 kg)  12/02/12 171 lb 8.3 oz (77.8 kg)  11/27/12 169 lb (76.658 kg)  11/22/12 169 lb (76.658 kg)  11/18/12 169 lb (76.658 kg)  10/16/12 170 lb (77.111 kg)  08/06/12 173 lb 3.2 oz (78.563 kg)  07/17/12 174 lb (78.926 kg)  07/17/12 174 lb (78.926 kg)   Body mass index is 21.44 kg/(m^2). WNL Diet: Regular. Eating well per his report. Denied any decrease in appetite PTA.   Chart reviewed, no nutrition interventions warranted at this time. Please consult if needed.   Clarene Duke RD, LDN Pager [phone] After Hours pager [phone]

## 2012-12-07 ENCOUNTER — Inpatient Hospital Stay (HOSPITAL_COMMUNITY): Payer: Medicaid Other

## 2012-12-07 MED ORDER — BISACODYL 10 MG RE SUPP: 10.0000 mg | Freq: Every day | RECTAL | Status: DC | PRN

## 2012-12-07 MED ORDER — MAGNESIUM HYDROXIDE 400 MG/5ML PO SUSP
30.0000 mL | Freq: Every day | ORAL | Status: DC | PRN
  Administered 2012-12-08: 30 mL via ORAL
  Filled 2012-12-07: qty 30

## 2012-12-07 NOTE — Progress Notes (Addendum)
       [address]       [place] [address]             [phone]          3 Days Post-Op Procedure(s) (LRB): VIDEO BRONCHOSCOPY (N/A) VIDEO ASSISTED THORACOSCOPY (VATS)/WEDGE RESECTION LOBECTOMY  Subjective: Still quite sore.  C/o constipation.   Objective: Vital signs in last 24 hours: Patient Vitals for the past 24 hrs:  BP Temp Temp src Pulse Resp SpO2  12/07/12 0749 - - - - - 90 %  12/07/12 0739 135/79 mmHg 98 F (36.7 C) Oral 77 18 92 %  12/07/12 0355 - - - - 16 98 %  12/07/12 0349 - - - 77 16 98 %  12/07/12 0325 - 98.1 F (36.7 C) Oral - - -  12/07/12 0313 142/89 mmHg - - 67 14 96 %  12/06/12 2353 - - - - 20 95 %  12/06/12 2351 - - - - 20 95 %  12/06/12 2314 136/77 mmHg - - 85 15 95 %  12/06/12 2305 - 98.9 F (37.2 C) Oral - - -  12/06/12 2200 - - - 81 20 95 %  12/06/12 2125 - - - 81 15 98 %  12/06/12 2000 - - - - 15 98 %  12/06/12 1933 136/88 mmHg - - 88 16 97 %  12/06/12 1930 - 98.6 F (37 C) Oral 84 20 97 %  12/06/12 1901 - - - 95 16 96 %  12/06/12 1700 148/79 mmHg 98.3 F (36.8 C) Oral - - -  12/06/12 1458 - - - - - 94 %  12/06/12 1148 160/97 mmHg 97.6 F (36.4 C) Oral - - -   Current Weight  12/05/12 171 lb 8.3 oz (77.8 kg)     Intake/Output from previous day: 08/08 0701 - 08/09 0700 In: 1941 [P.O.:720; I.V.:1221] Out: 2850 [Urine:2450; Chest Tube:400]    PHYSICAL EXAM:  Heart: RRR Lungs: Decreased in bases bilaterally Wound: Clean and dry Chest tube: No air leak on suction    Lab Results: CBC: Recent Labs  12/05/12 0420 12/06/12 0438  WBC 12.7* 10.5  HGB 13.9 13.1  HCT 39.6 37.8*  PLT 210 173   BMET:  Recent Labs  12/05/12 0420 12/06/12 0438  NA 137 136  K 3.2* 3.5  CL 102 99  CO2 26 31  GLUCOSE 153* 108*  BUN 13 9  CREATININE 0.79 0.84  CALCIUM 8.1* 8.4    PT/INR: No results found for this basename: LABPROT, INR,  in the last 72 hours   CXR:  Findings: Two chest tubes are present on  the left unchanged. No  pneumothorax or effusion. Central venous catheter tip in the SVC.  Negative for heart failure or pneumonia. The lungs are clear.  IMPRESSION:  No change from yesterday. No acute abnormality.   Assessment/Plan: S/P Procedure(s) (LRB): VIDEO BRONCHOSCOPY (N/A) VIDEO ASSISTED THORACOSCOPY (VATS)/WEDGE RESECTION LOBECTOMY CT did not get placed on water seal as ordered yesterday. Will place to water seal today.  Hopefully can d/c 1 CT soon. Hypokalemia- replace K+. LOC today. IVF to [facility] Ambulate, pulm toilet.    LOS: 3 days    COLLINS,GINA H 12/07/2012  D/c one chest tube today I have seen and examined Edgar Ross and agree with the above assessment  and plan.  Delight Ovens MD Beeper [phone] Office [phone] 12/07/2012 10:16 AM

## 2012-12-08 ENCOUNTER — Inpatient Hospital Stay (HOSPITAL_COMMUNITY): Payer: Medicaid Other

## 2012-12-08 MED ORDER — SODIUM CHLORIDE 0.9 % IJ SOLN
INTRAMUSCULAR | Status: AC
  Administered 2012-12-08: 22:00:00
  Filled 2012-12-08: qty 20

## 2012-12-08 NOTE — Progress Notes (Addendum)
       [address]       [name] [address]             [phone]          4 Days Post-Op Procedure(s) (LRB): VIDEO BRONCHOSCOPY (N/A) VIDEO ASSISTED THORACOSCOPY (VATS)/WEDGE RESECTION LOBECTOMY  Subjective: Comfortable, no complaints.  Breathing stable. Walking well  Objective: Vital signs in last 24 hours: Patient Vitals for the past 24 hrs:  BP Temp Temp src Pulse Resp SpO2  12/08/12 0809 - - - - - 95 %  12/08/12 0800 158/95 mmHg 98.1 F (36.7 C) Oral - - -  12/08/12 5621 - - - - 14 -  12/08/12 0330 146/92 mmHg 98.6 F (37 C) Oral 89 23 92 %  12/08/12 0023 146/87 mmHg 98.2 F (36.8 C) Oral 100 23 97 %  12/08/12 0000 - - - - 18 94 %  12/07/12 1937 135/96 mmHg 97.9 F (36.6 C) Oral 76 20 95 %  12/07/12 1600 - - - - 18 97 %  12/07/12 1520 104/84 mmHg 98.6 F (37 C) Oral 85 14 96 %  12/07/12 1351 - - - - - 93 %  12/07/12 1130 158/107 mmHg 97.4 F (36.3 C) Oral 65 20 96 %   Current Weight  12/05/12 171 lb 8.3 oz (77.8 kg)     Intake/Output from previous day: 08/09 0701 - 08/10 0700 In: 860 [P.O.:600; I.V.:260] Out: 1325 [Urine:1150; Chest Tube:175]    PHYSICAL EXAM:  Heart: RRR Lungs: Clear Wound: Clean and dry Chest tube: No air leak    Lab Results: CBC: Recent Labs  12/06/12 0438  WBC 10.5  HGB 13.1  HCT 37.8*  PLT 173   BMET:  Recent Labs  12/06/12 0438  NA 136  K 3.5  CL 99  CO2 31  GLUCOSE 108*  BUN 9  CREATININE 0.84  CALCIUM 8.4    PT/INR: No results found for this basename: LABPROT, INR,  in the last 72 hours  CXR: Findings: Left sided postoperative changes are identified.  There has been interval removal of one of the left thoracostomy  tubes. There is no evidence of pneumothorax.  A left IJ central venous catheter with tip overlying the upper SVC  / azygos vein noted. Mild left basilar atelectasis is identified.  There is no evidence of pulmonary edema or identifiable pleural  effusion.    IMPRESSION:  Support apparatus as described with removal of one of the left  thoracostomy tubes - no evidence of pneumothorax.  Left sided postoperative changes with mild left basilar  atelectasis.    Assessment/Plan: S/P Procedure(s) (LRB): VIDEO BRONCHOSCOPY (N/A) VIDEO ASSISTED THORACOSCOPY (VATS)/WEDGE RESECTION LOBECTOMY CXR stable and no air leak.  Hopefully can d/c remaining CT today. Continue pulm toilet, ambulation.  Possibly home in am if CT out and remains stable.   LOS: 4 days    COLLINS,GINA H 12/08/2012  No air leak chest tube out today I have seen and examined Kathryne Hitch and agree with the above assessment  and plan.  Delight Ovens MD Beeper [phone] Office [phone] 12/08/2012 10:48 AM

## 2012-12-09 ENCOUNTER — Inpatient Hospital Stay (HOSPITAL_COMMUNITY): Payer: Medicaid Other

## 2012-12-09 MED ORDER — OXYCODONE-ACETAMINOPHEN 5-325 MG PO TABS: 1.0000 | ORAL_TABLET | ORAL | Status: DC | PRN

## 2012-12-09 NOTE — Progress Notes (Signed)
Pt to be d/c home per order, PIV and CL removed-tolerated well, morning meds given, along with d/c instructions and script for pain medicine. No signs of infection at site. VSS.

## 2012-12-09 NOTE — Progress Notes (Signed)
[facility] DAILY ICU PROGRESS NOTE                   [address]            [place] [address]          [phone]   5 Days Post-Op Procedure(s) (LRB): VIDEO BRONCHOSCOPY (N/A) VIDEO ASSISTED THORACOSCOPY (VATS)/WEDGE RESECTION LOBECTOMY  Total Length of Stay:  LOS: 5 days   Subjective: Feels well overall  Objective: Vital signs in last 24 hours: Temp:  [97.9 F (36.6 C)-98.5 F (36.9 C)] 98.5 F (36.9 C) (08/11 0739) Pulse Rate:  [81-88] 88 (08/11 0739) Cardiac Rhythm:  [-] Normal sinus rhythm (08/11 0741) Resp:  [15-16] 16 (08/11 0328) BP: (121-154)/(66-94) 121/66 mmHg (08/11 0739) SpO2:  [95 %-98 %] 96 % (08/11 0739) FiO2 (%):  [21 %] 21 % (08/10 1343)  Filed Weights   12/05/12 0900  Weight: 171 lb 8.3 oz (77.8 kg)    Weight change:    Hemodynamic parameters for last 24 hours:    Intake/Output from previous day: 08/10 0701 - 08/11 0700 In: 1020 [P.O.:960; I.V.:60] Out: 1026 [Urine:1026]  Intake/Output this shift: Total I/O In: 240 [P.O.:240] Out: -   Current Meds: Scheduled Meds: . ARIPiprazole  5 mg Oral q morning - 10a  . bisacodyl  10 mg Oral Daily  . citalopram  20 mg Oral q morning - 10a  . docusate sodium  100 mg Oral BID  . enoxaparin  30 mg Subcutaneous Q24H  . levalbuterol  0.63 mg Nebulization TID  . pantoprazole  80 mg Oral Q1200  . traZODone  50 mg Oral QHS   Continuous Infusions: . dextrose 5 % and 0.45% NaCl 20 mL/hr at 12/07/12 1002   PRN Meds:.bisacodyl, ketorolac, magnesium hydroxide, ondansetron (ZOFRAN) IV, oxyCODONE-acetaminophen, potassium chloride, senna-docusate  General appearance: alert, cooperative and no distress Heart: regular rate and rhythm Lungs: min dim in bases Abdomen: soft, nondist, +BS, nondist Wound: incis healing well, some serous drainage from CT site  Lab Results: CBC:No results found for this basename: WBC, HGB, HCT, PLT,  in the last 72 hours BMET: No results found for this basename:  NA, K, CL, CO2, GLUCOSE, BUN, CREATININE, CALCIUM,  in the last 72 hours  PT/INR: No results found for this basename: LABPROT, INR,  in the last 72 hours Radiology: Dg Chest 2 View  12/09/2012   **ADDENDUM** CREATED: 12/09/2012 07:43:47  Not mentioned in the original report, opacity in the inferior retrosternal space is noted, which was not present on the previous lateral radiograph performed 12/02/12.  It measures  3.5 in width anteroposteriorly, and likely reflects postoperative change such as small mediastinal hematoma.  **END ADDENDUM** SIGNED BY: Esperanza Heir, M.D.  12/09/2012   *RADIOLOGY REPORT*  Clinical Data: Thoracic pain, chest tube removal  CHEST - 2 VIEW  Comparison: 12/08/2012  Findings: Left central line remains in place.  Left chest tube has been removed.  No pneumothorax identified.  Heart size and vascular pattern normal.  Minimal right upper lobe atelectasis.  Mild elevation of the left diaphragm, stable.  Postsurgical change noted in the left hilum.  IMPRESSION: No pneumothorax or acute complication status post removal of left chest tube.   Original Report Authenticated By: Esperanza Heir, M.D.   Dg Chest Port 1 View  12/08/2012   *RADIOLOGY REPORT*  Clinical Data: 62 year old male - status post VATS and left upper lobectomy for adenocarcinoma.  PORTABLE CHEST - 1 VIEW  Comparison: 12/07/2012 and prior chest radiographs dating back to 2011  Findings: Left sided postoperative changes are identified. There has been interval removal of one of the left thoracostomy tubes.  There is no evidence of pneumothorax. A left IJ central venous catheter with tip overlying the upper SVC / azygos vein noted. Mild left basilar atelectasis is identified. There is no evidence of pulmonary edema or identifiable pleural effusion.  IMPRESSION: Support apparatus as described with removal of one of the left thoracostomy tubes - no evidence of pneumothorax.  Left sided postoperative changes with mild left basilar  atelectasis.   Original Report Authenticated By: Harmon Pier, M.D.     Assessment/Plan: S/P Procedure(s) (LRB): VIDEO BRONCHOSCOPY (N/A) VIDEO ASSISTED THORACOSCOPY (VATS)/WEDGE RESECTION LOBECTOMY  1 conts to progress nicely, stable for discharge     GOLD,WAYNE E 12/09/2012 8:28 AM

## 2012-12-11 ENCOUNTER — Encounter (INDEPENDENT_AMBULATORY_CARE_PROVIDER_SITE_OTHER): Payer: Medicaid Other | Admitting: General Surgery

## 2012-12-12 ENCOUNTER — Emergency Department (HOSPITAL_COMMUNITY): Payer: Medicaid Other

## 2012-12-12 ENCOUNTER — Encounter (HOSPITAL_COMMUNITY): Payer: Self-pay | Admitting: *Deleted

## 2012-12-12 ENCOUNTER — Emergency Department (HOSPITAL_COMMUNITY)
Admission: EM | Admit: 2012-12-12 | Discharge: 2012-12-12 | Disposition: A | Discharge status: Home / Self Care | Payer: Medicaid Other | Attending: Emergency Medicine | Admitting: Emergency Medicine

## 2012-12-12 DIAGNOSIS — F411 Generalized anxiety disorder: Secondary | ICD-10-CM | POA: Insufficient documentation

## 2012-12-12 DIAGNOSIS — Z8669 Personal history of other diseases of the nervous system and sense organs: Secondary | ICD-10-CM | POA: Insufficient documentation

## 2012-12-12 DIAGNOSIS — Z862 Personal history of diseases of the blood and blood-forming organs and certain disorders involving the immune mechanism: Secondary | ICD-10-CM | POA: Insufficient documentation

## 2012-12-12 DIAGNOSIS — Z8589 Personal history of malignant neoplasm of other organs and systems: Secondary | ICD-10-CM | POA: Insufficient documentation

## 2012-12-12 DIAGNOSIS — K219 Gastro-esophageal reflux disease without esophagitis: Secondary | ICD-10-CM | POA: Insufficient documentation

## 2012-12-12 DIAGNOSIS — Z79899 Other long term (current) drug therapy: Secondary | ICD-10-CM | POA: Insufficient documentation

## 2012-12-12 DIAGNOSIS — G8918 Other acute postprocedural pain: Secondary | ICD-10-CM

## 2012-12-12 DIAGNOSIS — M549 Dorsalgia, unspecified: Secondary | ICD-10-CM

## 2012-12-12 DIAGNOSIS — Z8739 Personal history of other diseases of the musculoskeletal system and connective tissue: Secondary | ICD-10-CM | POA: Insufficient documentation

## 2012-12-12 DIAGNOSIS — J45909 Unspecified asthma, uncomplicated: Secondary | ICD-10-CM | POA: Insufficient documentation

## 2012-12-12 DIAGNOSIS — Z8639 Personal history of other endocrine, nutritional and metabolic disease: Secondary | ICD-10-CM | POA: Insufficient documentation

## 2012-12-12 DIAGNOSIS — F329 Major depressive disorder, single episode, unspecified: Secondary | ICD-10-CM | POA: Insufficient documentation

## 2012-12-12 DIAGNOSIS — F17200 Nicotine dependence, unspecified, uncomplicated: Secondary | ICD-10-CM | POA: Insufficient documentation

## 2012-12-12 DIAGNOSIS — F3289 Other specified depressive episodes: Secondary | ICD-10-CM | POA: Insufficient documentation

## 2012-12-12 DIAGNOSIS — Z8701 Personal history of pneumonia (recurrent): Secondary | ICD-10-CM | POA: Insufficient documentation

## 2012-12-12 DIAGNOSIS — Z9889 Other specified postprocedural states: Secondary | ICD-10-CM | POA: Insufficient documentation

## 2012-12-12 DIAGNOSIS — Z98811 Dental restoration status: Secondary | ICD-10-CM | POA: Insufficient documentation

## 2012-12-12 LAB — URINALYSIS, ROUTINE W REFLEX MICROSCOPIC
Bilirubin Urine: NEGATIVE
Glucose, UA: NEGATIVE mg/dL
Hgb urine dipstick: NEGATIVE
Ketones, ur: 15 mg/dL — AB
pH: 6 (ref 5.0–8.0)

## 2012-12-12 LAB — COMPREHENSIVE METABOLIC PANEL
ALT: 37 U/L (ref 0–53)
Albumin: 3.2 g/dL — ABNORMAL LOW (ref 3.5–5.2)
Alkaline Phosphatase: 80 U/L (ref 39–117)
GFR calc Af Amer: >90 mL/min (ref >90)
Glucose, Bld: 98 mg/dL (ref 70–99)
Potassium: 4.6 mEq/L (ref 3.5–5.1)
Sodium: 138 mEq/L (ref 135–145)
Total Protein: 7.1 g/dL (ref 6.0–8.3)

## 2012-12-12 LAB — CBC
Hemoglobin: 14.4 g/dL (ref 13.0–17.0)
MCHC: 33.6 g/dL (ref 30.0–36.0)

## 2012-12-12 MED ORDER — MORPHINE SULFATE 4 MG/ML IJ SOLN
4.0000 mg | Freq: Once | INTRAMUSCULAR | Status: AC
  Administered 2012-12-12: 4 mg via INTRAVENOUS
  Filled 2012-12-12: qty 1

## 2012-12-12 NOTE — ED Notes (Signed)
Patient transported to X-ray 
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
## 2012-12-12 NOTE — ED Notes (Signed)
Pt states he had surgery for a L lobectomy last Wed and was released from hosp on Mon.  States spasm like pain to L mid back starting last night.  VS wnl.  Incision dry and intact. No pain on inspiration, only palpation.

## 2012-12-13 ENCOUNTER — Other Ambulatory Visit: Payer: Self-pay | Admitting: *Deleted

## 2012-12-13 DIAGNOSIS — G8918 Other acute postprocedural pain: Secondary | ICD-10-CM

## 2012-12-13 MED ORDER — HYDROCODONE-ACETAMINOPHEN 10-325 MG PO TABS: 1.0000 | ORAL_TABLET | ORAL | Status: DC | PRN

## 2012-12-17 ENCOUNTER — Other Ambulatory Visit: Payer: Self-pay | Admitting: *Deleted

## 2012-12-17 DIAGNOSIS — R918 Other nonspecific abnormal finding of lung field: Secondary | ICD-10-CM

## 2012-12-18 ENCOUNTER — Encounter: Payer: Self-pay | Admitting: Cardiothoracic Surgery

## 2012-12-19 ENCOUNTER — Telehealth: Payer: Self-pay | Admitting: Dietician

## 2012-12-20 ENCOUNTER — Ambulatory Visit (INDEPENDENT_AMBULATORY_CARE_PROVIDER_SITE_OTHER): Payer: Self-pay | Admitting: Cardiothoracic Surgery

## 2012-12-20 ENCOUNTER — Other Ambulatory Visit: Payer: Self-pay | Admitting: *Deleted

## 2012-12-20 ENCOUNTER — Ambulatory Visit
Admission: RE | Admit: 2012-12-20 | Discharge: 2012-12-20 | Disposition: A | Discharge status: Home / Self Care | Payer: Medicaid Other | Source: Ambulatory Visit | Attending: Cardiothoracic Surgery | Admitting: Cardiothoracic Surgery

## 2012-12-20 ENCOUNTER — Encounter: Payer: Self-pay | Admitting: Cardiothoracic Surgery

## 2012-12-20 DIAGNOSIS — C3410 Malignant neoplasm of upper lobe, unspecified bronchus or lung: Secondary | ICD-10-CM

## 2012-12-20 DIAGNOSIS — R918 Other nonspecific abnormal finding of lung field: Secondary | ICD-10-CM

## 2012-12-20 DIAGNOSIS — Z9889 Other specified postprocedural states: Secondary | ICD-10-CM

## 2012-12-20 DIAGNOSIS — Z902 Acquired absence of lung [part of]: Secondary | ICD-10-CM

## 2012-12-20 DIAGNOSIS — G8918 Other acute postprocedural pain: Secondary | ICD-10-CM

## 2012-12-20 MED ORDER — OXYCODONE-ACETAMINOPHEN 5-325 MG PO TABS: 1.0000 | ORAL_TABLET | ORAL | Status: DC | PRN

## 2012-12-23 ENCOUNTER — Telehealth: Payer: Self-pay | Admitting: *Deleted

## 2012-12-23 NOTE — Telephone Encounter (Signed)
EGFR--not detected.  EGFR and surgical path given to Dr Donnald Garre to review.  SLJ

## 2012-12-25 ENCOUNTER — Other Ambulatory Visit: Payer: Self-pay | Admitting: *Deleted

## 2012-12-25 DIAGNOSIS — C801 Malignant (primary) neoplasm, unspecified: Secondary | ICD-10-CM

## 2012-12-27 ENCOUNTER — Telehealth: Payer: Self-pay | Admitting: *Deleted

## 2012-12-27 NOTE — Progress Notes (Signed)
[address]       [place] [address]             [phone]                  Davidjames Blansett [facility] Medical Record #[id number] Date of Birth: 1950/07/08  [name], MD [facility], MD  Chief Complaint:   PostOp Follow Up Visit 12/04/2012   PREOPERATIVE DIAGNOSIS: Left upper lobe lung nodule highly suspicious  for malignancy.  POSTOPERATIVE DIAGNOSIS: Left upper lobe lung nodule highly suspicious  for malignancy. 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  PROCEDURE PERFORMED: Bronchoscopy, left video-assisted thoracoscopy,  mini thoracotomy, wedge resection of left upper lobe, completion of left  upper lobectomy, and lymph node dissection and placement of On-Q device.  SURGEON: [name], MD  PATH:IB pT2a,pN0, cMo  ALK positive  adenocarcinoma   ADDITIONAL INFORMATION: 1. Tissue tumor block sent to [facility] for EGFR tyrosine kinase inhibitor (TKI) eligibility assay. Result: Alteration not detected. Please see [facility] report for full details. (RAH:caf 12/20/12) Zandra Abts MD Pathologist, Electronic Signature ( Signed 12/23/2012) 1. Tissue tumor block sent to [facility] for ALK gene rearrangement by FISH ([id number]). Result: ALK gene rearrangement detected. Please see [facility] report for full details. (RAH:gt, 12/18/12) Zandra Abts MD Pathologist, Electronic Signature ( Signed 12/18/2012) FINAL DIAGNOSIS Diagnosis 1. Lung, wedge biopsy/resection, Left upper lobe - INVASIVE ADENOCARCINOMA, SEE COMMENT. - LYMPHOVASCULAR INVASION IDENTIFIED. - INVASIVE TUMOR INVOLVES VISCERAL PLEURA. - SURGICAL MARGIN, NEGATIVE FOR TUMOR. - SEE TUMOR SYNOPTIC TEMPLATE BELOW. 2. Lung, resection (segmental or lobe), Left upper lobe - BENIGN LUNG TISSUE. - NEGATIVE FOR ATYPIA OR MALIGNANCY. 1 of 3 FINAL for Edgar Ross, Edgar Ross [phone]) Diagnosis(continued) - BRONCHO-VASCULAR MARGIN, NEGATIVE FOR TUMOR. 3. Lymph node, biopsy, 10L - ONE LYMPH NODE,  NEGATIVE FOR TUMOR (0/1). 4. Lymph node, biopsy, 4L - ONE LYMPH NODE, NEGATIVE FOR TUMOR (0/1). Microscopic Comment 1. LUNG Specimen, including laterality: Left upper lobe lung Procedure: Lobectomy Specimen integrity (intact/disrupted): Intact Tumor site: Subpleural Tumor focality: Unifocal Maximum tumor size (cm): 1.4 cm Histologic type: Adenocarcinoma (solid and clear cell types). Grade: Moderately differentiated/grade II Margins: Negative Visceral pleura invasion: Present Tumor extension: Confined to subpleural parenchyma Treatment effect (if treated with neoadjuvant therapy): None Lymph -Vascular invasion: Present Lymph nodes: Number examined - 2; Number N1 nodes positive 0; Number N2 nodes positive 0 TNM code: pT2, pN0, pMX Ancillary studies: Can be performed upon request Non-neoplastic lung: Anthracotic pigment deposition, pigmented pulmonary macrophages, peribronchiolar chronic inflammation. [place] RUND DO  History of Present Illness:     Patient doing well post op. Little post op pain. Not SOB     History  Smoking status  . Current Every Day Smoker -- 0.25 packs/day for 45 years  . Types: Cigarettes  Smokeless tobacco  . Never Used       Allergies  Allergen Reactions  . Suboxone [Buprenorphine Hcl-Naloxone Hcl] Nausea And Vomiting  . Vancomycin Hives    Only where applied.    Current Outpatient Prescriptions  Medication Sig Dispense Refill  . ARIPiprazole (ABILIFY) 5 MG tablet Take 5 mg by mouth every morning.       . citalopram (CELEXA) 20 MG tablet Take 20 mg by mouth every morning.       Marland Kitchen esomeprazole (NEXIUM) 40 MG capsule Take 40 mg by mouth daily as needed (heartburn).       Marland Kitchen oxyCODONE-acetaminophen (PERCOCET/ROXICET) 5-325 MG per tablet Take 1-2 tablets by mouth every 4 (four)  hours as needed.  50 tablet  0  . traZODone (DESYREL) 50 MG tablet Take 50 mg by mouth at bedtime.       No current facility-administered medications for this visit.        Physical Exam: BP 125/84  Pulse 98  Resp 16  Ht 6\' 3"  (1.905 m)  Wt 165 lb (74.844 kg)  BMI 20.62 kg/m2  SpO2 99%  General appearance: alert and cooperative Neurologic: intact Heart: regular rate and rhythm, S1, S2 normal, no murmur, click, rub or gallop Lungs: clear to auscultation bilaterally Abdomen: soft, non-tender; bowel sounds normal; no masses,  no organomegaly Extremities: extremities normal, atraumatic, no cyanosis or edema and Homans sign is negative, no sign of DVT Wound: right chest tube sites and vats incision helaing well Wounds:  Diagnostic Studies & Laboratory data:         Recent Radiology Findings: Dg Chest 2 View  12/20/2012   *RADIOLOGY REPORT*  Clinical Data: Lung cancer with previous surgery.  Follow-up.  CHEST - 2 VIEW  Comparison: 12/13/2011 and previous  Findings: Heart size is normal.  There has been previous pulmonary resection on the left.  There is tenting of the left diaphragm. There is chronic scarring in the right midlung.  Nipple shadows are present bilaterally. No residual pneumothorax.  IMPRESSION: Previous pulmonary resection on the left.  Elevation of the left hemidiaphragm related to that.  No residual pneumothorax.   Original Report Authenticated By: [name], M.D.     Recent Labs: Lab Results  Component Value Date   WBC 8.7 12/12/2012   HGB 14.4 12/12/2012   HCT 42.9 12/12/2012   PLT 288 12/12/2012   GLUCOSE 98 12/12/2012   ALT 37 12/12/2012   AST 30 12/12/2012   NA 138 12/12/2012   K 4.6 12/12/2012   CL 103 12/12/2012   CREATININE 0.89 12/12/2012   BUN 18 12/12/2012   CO2 25 12/12/2012   TSH 0.387 05/10/2010   INR 0.94 12/02/2012      Assessment / Plan:     Patient doing well post op. His case was presented at [facility] conference no further treatment was indicated. Will see back with chest xray in 3 months and ct scan of chest in 6 months   Edgar Ross B

## 2012-12-27 NOTE — Telephone Encounter (Signed)
I called patient to check in.  Patient 3 weeks post surgery.  He reports that he continues to have tenderness at his surgical site and pain with movement in his ribs which he rates as 6.  He reports that his pain medication helps but does not totally relieve the pain.  He does report that it is improving.  He denies any shortness of breath or breathing difficulties.  He reports that he is doing well and has no questions or concerns at this time.  I instructed him to call if his pain does not continue to improve or if he has any other needs.  We also reviewed his appointment schedule.  Patient verbalized understanding.

## 2013-01-07 ENCOUNTER — Ambulatory Visit (INDEPENDENT_AMBULATORY_CARE_PROVIDER_SITE_OTHER): Payer: Medicaid Other | Admitting: General Surgery

## 2013-01-07 ENCOUNTER — Encounter (INDEPENDENT_AMBULATORY_CARE_PROVIDER_SITE_OTHER): Payer: Self-pay | Admitting: General Surgery

## 2013-01-07 VITALS — BP 130/82 | HR 66 | Temp 97.0°F | Resp 18 | Ht 75.0 in | Wt 164.0 lb

## 2013-01-07 DIAGNOSIS — E042 Nontoxic multinodular goiter: Secondary | ICD-10-CM

## 2013-01-07 NOTE — Patient Instructions (Signed)
You seem to be recovering uneventfully following your surgery for cancer of the left lung. We are happy that this was diagnosed early.  We will plan to schedule your total thyroidectomy in the near future, as we discussed.    Thyroidectomy Thyroidectomy is the removal of part or all of your thyroid gland. Your thyroid gland is a butterfly-shaped gland at the base of your neck. It produces a substance called thyroid hormone, which regulates the physical and chemical processes that keep your body functioning and make energy available to your body (metabolism). The amount of thyroid gland tissue that is removed during a thyroidectomy depends on the reason for the procedure. Typically, if only a part of your gland is removed, enough thyroid gland tissue remains to maintain normal function. If your entire thyroid gland is removed or if the amount of thyroid gland tissue remaining is inadequate to maintain normal function, you will need life-long treatment with thyroid hormone on a daily basis. Thyroidectomy maybe performed when you have the following conditions:  Thyroid nodules. These are small, abnormal collections of tissue that form inside the thyroid gland. If these nodules begin to enlarge at a rapid rate, a sample of tissue from the nodule is taken through a needle and examined (needle biopsy). This is done to determine if the nodules are cancerous. Depending on the outcome of this exam, thyroidectomy may be necessary.  Thyroid cancer.  Goiter, which is an enlarged thyroid gland. All or part of the thyroid gland may be removed if the gland has become so large that it causes difficulty breathing or swallowing.  Hyperthyroidism. This is when the thyroid gland produces too much thyroid hormone. Hypothyroidism can cause symptoms of fluctuating weight, intolerance to heat, irritability, shortness of breath, and chest pain. LET YOUR CAREGIVER KNOW ABOUT:   Allergies to food or medicine.  Medicines  that you are taking, including vitamins, herbs, eyedrops, over-the-counter medicines, and creams.  Previous problems you have had with anesthetics or numbing medicines.  History of bleeding problems or blood clots.  Previous surgeries you have had.  Other health problems, including diabetes and kidney problems, you have had.  Possibility of pregnancy, if this applies. BEFORE THE PROCEDURE   Do not eat or drink anything, including water, for at least 6 hours before the procedure.  Ask your caregiver whether you should stop taking certain medicines before the day of the procedure. PROCEDURE  There are different ways that thyroidectomy is performed. For each type, you will be given a medicine to make you sleep (general anesthetic). The three main types of thyroidectomy are listed as follows:  Conventional thyroidectomy A cut (incision) in the center portion of your lower neck is made with a scalpel. Muscles below your skin are separated to gain access to your thyroid gland. Your thyroid gland is dissected from your windpipe (trachea). Often a drain is placed at the incision site to drain any blood that accumulates under the skin after the procedure. This drain will be removed before you go home. The wound from the incision should heal within 2 weeks.  Endoscopic thyroidectomy Small incisions are made in your lower neck. A small instrument (endoscope) is inserted under your skin at the incision sites. The endoscope used for thyroidectomy consists of 2 flexible tubes. Inside one of the tubes is a video camera that is used to guide the Careers adviser. Tools to remove the thyroid gland, including a tool to cut the gland (dissectors) and a suction device, are inserted through  the other tube. The surgeon uses the dissectors to dissect the thyroid gland from the trachea and remove it.  Robotic thyroidectomy This procedure allows your thyroid gland to be removed through incisions in your armpit, your chest, or  high in your neck. Instruments similar to endoscopes provide a 3-dimensional picture of the surgical site. Dissecting instruments are controlled by devices similar to joysticks. These devices allow more accurate manipulation of the instruments. After the blood supply to the gland is removed, the gland is cut into several pieces and removed through the incisions. RISKS AND COMPLICATIONS Complications associated with thyroidectomy are rare, but they can occur. Possible complications include:  A decrease in parathyroid hormone levels (hypoparathyroidism) Your parathyroid glands are located close behind your thyroid gland. They are responsible for maintaining calcium levels inthe body. If they are damaged or removed, levels of calcium in the blood become low and nerves become irritable, which can cause muscle spasms. Medicines are available to treat this.  Bacterial infection This can often be treated with medicines that kill bacteria (antibiotics).  Damage to your voice box nerves This could cause hoarseness or complete loss of voice.  Bleeding or airway obstruction. AFTER THE PROCEDURE   You will rest in the recovery room as you wake up.  When you first wake up, your throat may feel slightly sore.  You will not be allowed to eat or drink until instructed otherwise.  You will be taken to your hospital room. You will usually stay at the hospital for 1 or 2 nights.  If a drain is placed during the procedure, it usually is removed the next day.  You may have some mild neck pain.  Your voice may be weak. This usually is temporary. Document Released: 10/11/2000 Document Revised: 07/10/2011 Document Reviewed: 07/20/2010 [facility] Patient Information 2014 [place], [place].
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
## 2013-02-05 ENCOUNTER — Encounter (HOSPITAL_COMMUNITY): Payer: Self-pay | Admitting: Pharmacy Technician

## 2013-02-05 ENCOUNTER — Encounter (HOSPITAL_COMMUNITY)
Admission: RE | Admit: 2013-02-05 | Discharge: 2013-02-05 | Disposition: A | Discharge status: Home / Self Care | Payer: Medicaid Other | Source: Ambulatory Visit | Attending: General Surgery | Admitting: General Surgery

## 2013-02-05 ENCOUNTER — Encounter (HOSPITAL_COMMUNITY): Payer: Self-pay

## 2013-02-05 DIAGNOSIS — Z01812 Encounter for preprocedural laboratory examination: Secondary | ICD-10-CM | POA: Insufficient documentation

## 2013-02-05 HISTORY — DX: Malignant (primary) neoplasm, unspecified: C80.1

## 2013-02-05 HISTORY — DX: Dorsalgia, unspecified: M54.9

## 2013-02-05 HISTORY — DX: Anesthesia of skin: R20.0

## 2013-02-05 HISTORY — DX: Insomnia, unspecified: G47.00

## 2013-02-05 LAB — CBC WITH DIFFERENTIAL/PLATELET
Basophils Absolute: 0 10*3/uL (ref 0.0–0.1)
Basophils Relative: 0 % (ref 0–1)
Eosinophils Relative: 1 % (ref 0–5)
HCT: 42.9 % (ref 39.0–52.0)
Hemoglobin: 14.5 g/dL (ref 13.0–17.0)
MCHC: 33.8 g/dL (ref 30.0–36.0)
MCV: 84.8 fL (ref 78.0–100.0)
Monocytes Absolute: 0.6 10*3/uL (ref 0.1–1.0)
Monocytes Relative: 9 % (ref 3–12)
Neutro Abs: 3.4 10*3/uL (ref 1.7–7.7)
Neutrophils Relative %: 50 % (ref 43–77)
RDW: 14 % (ref 11.5–15.5)

## 2013-02-05 LAB — COMPREHENSIVE METABOLIC PANEL
AST: 35 U/L (ref 0–37)
Albumin: 3.4 g/dL — ABNORMAL LOW (ref 3.5–5.2)
BUN: 14 mg/dL (ref 6–23)
CO2: 25 mEq/L (ref 19–32)
Calcium: 9 mg/dL (ref 8.4–10.5)
Chloride: 106 mEq/L (ref 96–112)
Creatinine, Ser: 0.86 mg/dL (ref 0.50–1.35)
GFR calc non Af Amer: >90 mL/min (ref >90)
Potassium: 4.2 mEq/L (ref 3.5–5.1)
Total Bilirubin: 0.2 mg/dL — ABNORMAL LOW (ref 0.3–1.2)

## 2013-02-05 MED ORDER — CHLORHEXIDINE GLUCONATE 4 % EX LIQD: 1.0000 "application " | Freq: Once | CUTANEOUS | Status: DC

## 2013-02-05 NOTE — Pre-Procedure Instructions (Signed)
Edgar Ross  02/05/2013   Your procedure is scheduled on:  Fri, Oct 17 @ 7:30 AM  Report to [facility] Stay [facility]trance A  at 5:30 AM.  Call this number if you have problems the morning of surgery: [phone]   Remember:   Do not eat food or drink liquids after midnight.   Take these medicines the morning of surgery with A SIP OF WATER: Abilify(Aripiprazole),Celexa(Citalopram),Nexium(Esomeprazole),and Pain Pill(if needed)               No Goody's,BC's,Ibuprofen,Aspirin,Aleve,Fish Oil,or any Herbal Medications   Do not wear jewelry  Do not wear lotions, powders, or colognes. You may wear deodorant.  Men may shave face and neck.  Do not bring valuables to the hospital.  [facility] is not responsible                  for any belongings or valuables.               Contacts, dentures or bridgework may not be worn into surgery.  Leave suitcase in the car. After surgery it may be brought to your room.  For patients admitted to the hospital, discharge time is determined by your                treatment team.               Patients discharged the day of surgery will not be allowed to drive  home.  Special Instructions: Shower using CHG 2 nights before surgery and the night before surgery.  If you shower the day of surgery use CHG.  Use special wash - you have one bottle of CHG for all showers.  You should use approximately 1/3 of the bottle for each shower.   Please read over the following fact sheets that you were given: Pain Booklet, Coughing and Deep Breathing and Surgical Site Infection Prevention

## 2013-02-05 NOTE — Progress Notes (Addendum)
Pt doesn't have a cardiologist   EKG in epic from 10-2012  CXR in epic from 11-2012  Echo report in epic from 2011  Denies ever having a stress test or heart  Medical Md is Dr.W[facility]

## 2013-02-13 MED ORDER — CEFAZOLIN SODIUM-DEXTROSE 2-3 GM-% IV SOLR
2.0000 g | INTRAVENOUS | Status: AC
  Administered 2013-02-14: 2 g via INTRAVENOUS
  Filled 2013-02-13: qty 50
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
## 2013-02-14 ENCOUNTER — Ambulatory Visit (HOSPITAL_COMMUNITY): Payer: Medicaid Other | Admitting: Certified Registered"

## 2013-02-14 ENCOUNTER — Encounter (HOSPITAL_COMMUNITY)
Admission: RE | Disposition: A | Discharge status: Home / Self Care | Payer: Self-pay | Source: Ambulatory Visit | Attending: General Surgery

## 2013-02-14 ENCOUNTER — Observation Stay (HOSPITAL_COMMUNITY)
Admission: RE | Admit: 2013-02-14 | Discharge: 2013-02-15 | Disposition: A | Discharge status: Home / Self Care | Payer: Medicaid Other | Source: Ambulatory Visit | Attending: General Surgery | Admitting: General Surgery

## 2013-02-14 ENCOUNTER — Encounter (HOSPITAL_COMMUNITY): Payer: Self-pay | Admitting: *Deleted

## 2013-02-14 ENCOUNTER — Encounter (HOSPITAL_COMMUNITY): Payer: Medicaid Other | Admitting: Certified Registered"

## 2013-02-14 DIAGNOSIS — F17200 Nicotine dependence, unspecified, uncomplicated: Secondary | ICD-10-CM | POA: Insufficient documentation

## 2013-02-14 DIAGNOSIS — E042 Nontoxic multinodular goiter: Principal | ICD-10-CM | POA: Diagnosis present

## 2013-02-14 DIAGNOSIS — J398 Other specified diseases of upper respiratory tract: Secondary | ICD-10-CM | POA: Insufficient documentation

## 2013-02-14 DIAGNOSIS — K219 Gastro-esophageal reflux disease without esophagitis: Secondary | ICD-10-CM | POA: Insufficient documentation

## 2013-02-14 DIAGNOSIS — F329 Major depressive disorder, single episode, unspecified: Secondary | ICD-10-CM | POA: Insufficient documentation

## 2013-02-14 DIAGNOSIS — E049 Nontoxic goiter, unspecified: Secondary | ICD-10-CM

## 2013-02-14 DIAGNOSIS — F3289 Other specified depressive episodes: Secondary | ICD-10-CM | POA: Insufficient documentation

## 2013-02-14 DIAGNOSIS — F411 Generalized anxiety disorder: Secondary | ICD-10-CM | POA: Insufficient documentation

## 2013-02-14 DIAGNOSIS — C3490 Malignant neoplasm of unspecified part of unspecified bronchus or lung: Secondary | ICD-10-CM | POA: Insufficient documentation

## 2013-02-14 HISTORY — PX: THYROIDECTOMY: SHX17

## 2013-02-14 SURGERY — THYROIDECTOMY
Anesthesia: General | Wound class: Clean

## 2013-02-14 MED ORDER — PHENYLEPHRINE HCL 10 MG/ML IJ SOLN
INTRAMUSCULAR | Status: DC | PRN
  Administered 2013-02-14: 80 ug via INTRAVENOUS
  Administered 2013-02-14: 40 ug via INTRAVENOUS

## 2013-02-14 MED ORDER — FENTANYL CITRATE 0.05 MG/ML IJ SOLN
50.0000 ug | Freq: Once | INTRAMUSCULAR | Status: DC
Start: 2013-02-14 — End: 2013-02-14

## 2013-02-14 MED ORDER — BUPIVACAINE-EPINEPHRINE (PF) 0.5% -1:200000 IJ SOLN
INTRAMUSCULAR | Status: AC
  Filled 2013-02-14: qty 10

## 2013-02-14 MED ORDER — CITALOPRAM HYDROBROMIDE 20 MG PO TABS
20.0000 mg | ORAL_TABLET | Freq: Every morning | ORAL | Status: DC
  Administered 2013-02-14: 20 mg via ORAL
  Filled 2013-02-14 (×2): qty 1

## 2013-02-14 MED ORDER — CALCIUM CARBONATE 1250 (500 CA) MG PO TABS
2.0000 | ORAL_TABLET | Freq: Three times a day (TID) | ORAL | Status: DC
  Administered 2013-02-14 – 2013-02-15 (×2): 1000 mg via ORAL
  Filled 2013-02-14: qty 1
  Filled 2013-02-14 (×2): qty 2
  Filled 2013-02-14: qty 1
  Filled 2013-02-14: qty 2

## 2013-02-14 MED ORDER — PROMETHAZINE HCL 25 MG/ML IJ SOLN: 6.2500 mg | INTRAMUSCULAR | Status: DC | PRN

## 2013-02-14 MED ORDER — ENOXAPARIN SODIUM 40 MG/0.4ML ~~LOC~~ SOLN
40.0000 mg | SUBCUTANEOUS | Status: DC
  Administered 2013-02-14: 40 mg via SUBCUTANEOUS
  Filled 2013-02-14 (×2): qty 0.4

## 2013-02-14 MED ORDER — OXYCODONE HCL 5 MG PO TABS
5.0000 mg | ORAL_TABLET | Freq: Once | ORAL | Status: AC | PRN
  Administered 2013-02-14: 5 mg via ORAL

## 2013-02-14 MED ORDER — FENTANYL CITRATE 0.05 MG/ML IJ SOLN
INTRAMUSCULAR | Status: DC | PRN
  Administered 2013-02-14: 100 ug via INTRAVENOUS
  Administered 2013-02-14 (×3): 50 ug via INTRAVENOUS

## 2013-02-14 MED ORDER — POTASSIUM CHLORIDE IN NACL 20-0.9 MEQ/L-% IV SOLN
INTRAVENOUS | Status: DC
  Administered 2013-02-14 – 2013-02-15 (×2): via INTRAVENOUS
  Filled 2013-02-14 (×4): qty 1000

## 2013-02-14 MED ORDER — MIDAZOLAM HCL 2 MG/2ML IJ SOLN: 1.0000 mg | INTRAMUSCULAR | Status: DC | PRN

## 2013-02-14 MED ORDER — 0.9 % SODIUM CHLORIDE (POUR BTL) OPTIME
TOPICAL | Status: DC | PRN
  Administered 2013-02-14: 1000 mL

## 2013-02-14 MED ORDER — PROPOFOL 10 MG/ML IV BOLUS
INTRAVENOUS | Status: DC | PRN
  Administered 2013-02-14: 180 mg via INTRAVENOUS

## 2013-02-14 MED ORDER — BUPIVACAINE-EPINEPHRINE PF 0.5-1:200000 % IJ SOLN
INTRAMUSCULAR | Status: DC | PRN
  Administered 2013-02-14: 7 mL

## 2013-02-14 MED ORDER — TRAZODONE HCL 50 MG PO TABS
50.0000 mg | ORAL_TABLET | Freq: Every day | ORAL | Status: DC
  Filled 2013-02-14 (×2): qty 1

## 2013-02-14 MED ORDER — HYDROMORPHONE HCL PF 1 MG/ML IJ SOLN
INTRAMUSCULAR | Status: AC
  Filled 2013-02-14: qty 1

## 2013-02-14 MED ORDER — OXYCODONE-ACETAMINOPHEN 5-325 MG PO TABS
1.0000 | ORAL_TABLET | ORAL | Status: DC | PRN
  Administered 2013-02-14 – 2013-02-15 (×3): 2 via ORAL
  Filled 2013-02-14 (×3): qty 2

## 2013-02-14 MED ORDER — MORPHINE SULFATE 2 MG/ML IJ SOLN
2.0000 mg | INTRAMUSCULAR | Status: DC | PRN
  Administered 2013-02-14: 2 mg via INTRAVENOUS
  Filled 2013-02-14: qty 1

## 2013-02-14 MED ORDER — HEMOSTATIC AGENTS (NO CHARGE) OPTIME
TOPICAL | Status: DC | PRN
  Administered 2013-02-14: 1 via TOPICAL

## 2013-02-14 MED ORDER — NEOSTIGMINE METHYLSULFATE 1 MG/ML IJ SOLN
INTRAMUSCULAR | Status: DC | PRN
  Administered 2013-02-14: 4 mg via INTRAVENOUS

## 2013-02-14 MED ORDER — ROCURONIUM BROMIDE 100 MG/10ML IV SOLN
INTRAVENOUS | Status: DC | PRN
  Administered 2013-02-14: 50 mg via INTRAVENOUS

## 2013-02-14 MED ORDER — ONDANSETRON HCL 4 MG PO TABS: 4.0000 mg | ORAL_TABLET | Freq: Four times a day (QID) | ORAL | Status: DC | PRN

## 2013-02-14 MED ORDER — LACTATED RINGERS IV SOLN
INTRAVENOUS | Status: DC | PRN
  Administered 2013-02-14 (×2): via INTRAVENOUS

## 2013-02-14 MED ORDER — GLYCOPYRROLATE 0.2 MG/ML IJ SOLN
INTRAMUSCULAR | Status: DC | PRN
  Administered 2013-02-14: 0.6 mg via INTRAVENOUS

## 2013-02-14 MED ORDER — ARIPIPRAZOLE 5 MG PO TABS
5.0000 mg | ORAL_TABLET | Freq: Every morning | ORAL | Status: DC
  Administered 2013-02-14: 5 mg via ORAL
  Filled 2013-02-14 (×2): qty 1

## 2013-02-14 MED ORDER — INFLUENZA VAC SPLIT QUAD 0.5 ML IM SUSP
0.5000 mL | INTRAMUSCULAR | Status: DC
  Filled 2013-02-14: qty 0.5

## 2013-02-14 MED ORDER — HYDROMORPHONE HCL PF 1 MG/ML IJ SOLN
0.2500 mg | INTRAMUSCULAR | Status: DC | PRN
  Administered 2013-02-14 (×4): 0.5 mg via INTRAVENOUS

## 2013-02-14 MED ORDER — OXYCODONE HCL 5 MG PO TABS
ORAL_TABLET | ORAL | Status: AC
  Filled 2013-02-14: qty 1

## 2013-02-14 MED ORDER — ONDANSETRON HCL 4 MG/2ML IJ SOLN
INTRAMUSCULAR | Status: DC | PRN
  Administered 2013-02-14: 4 mg via INTRAMUSCULAR

## 2013-02-14 MED ORDER — ARTIFICIAL TEARS OP OINT
TOPICAL_OINTMENT | OPHTHALMIC | Status: DC | PRN
  Administered 2013-02-14: 1 via OPHTHALMIC

## 2013-02-14 MED ORDER — MIDAZOLAM HCL 5 MG/5ML IJ SOLN
INTRAMUSCULAR | Status: DC | PRN
  Administered 2013-02-14: 2 mg via INTRAVENOUS

## 2013-02-14 MED ORDER — ONDANSETRON HCL 4 MG/2ML IJ SOLN: 4.0000 mg | Freq: Four times a day (QID) | INTRAMUSCULAR | Status: DC | PRN

## 2013-02-14 MED ORDER — EPHEDRINE SULFATE 50 MG/ML IJ SOLN
INTRAMUSCULAR | Status: DC | PRN
  Administered 2013-02-14: 10 mg via INTRAVENOUS

## 2013-02-14 MED ORDER — LIDOCAINE HCL (CARDIAC) 20 MG/ML IV SOLN
INTRAVENOUS | Status: DC | PRN
  Administered 2013-02-14: 40 mg via INTRAVENOUS

## 2013-02-14 MED ORDER — PANTOPRAZOLE SODIUM 40 MG PO TBEC
40.0000 mg | DELAYED_RELEASE_TABLET | Freq: Every day | ORAL | Status: DC
  Administered 2013-02-14: 40 mg via ORAL
  Filled 2013-02-14: qty 1

## 2013-02-14 MED ORDER — LEVOTHYROXINE SODIUM 100 MCG PO TABS
100.0000 ug | ORAL_TABLET | Freq: Every day | ORAL | Status: DC
  Administered 2013-02-15: 100 ug via ORAL
  Filled 2013-02-14 (×3): qty 1

## 2013-02-14 MED ORDER — OXYCODONE HCL 5 MG/5ML PO SOLN: 5.0000 mg | Freq: Once | ORAL | Status: AC | PRN

## 2013-02-14 SURGICAL SUPPLY — 49 items
ATTRACTOMAT 16X20 MAGNETIC DRP (DRAPES) IMPLANT
BLADE SURG 10 STRL SS (BLADE) ×2 IMPLANT
BLADE SURG 15 STRL LF DISP TIS (BLADE) ×1 IMPLANT
BLADE SURG 15 STRL SS (BLADE) ×1
CANISTER SUCTION 2500CC (MISCELLANEOUS) ×2 IMPLANT
CHLORAPREP W/TINT 26ML (MISCELLANEOUS) ×2 IMPLANT
CLIP TI MEDIUM 24 (CLIP) ×2 IMPLANT
CLIP TI WIDE RED SMALL 24 (CLIP) ×2 IMPLANT
CONT SPEC 4OZ CLIKSEAL STRL BL (MISCELLANEOUS) IMPLANT
COVER SURGICAL LIGHT HANDLE (MISCELLANEOUS) ×2 IMPLANT
CRADLE DONUT ADULT HEAD (MISCELLANEOUS) ×2 IMPLANT
DERMABOND ADVANCED (GAUZE/BANDAGES/DRESSINGS) ×1
DERMABOND ADVANCED .7 DNX12 (GAUZE/BANDAGES/DRESSINGS) ×1 IMPLANT
DRAPE PED LAPAROTOMY (DRAPES) ×2 IMPLANT
DRAPE UTILITY 15X26 W/TAPE STR (DRAPE) ×4 IMPLANT
ELECT CAUTERY BLADE 6.4 (BLADE) ×2 IMPLANT
ELECT REM PT RETURN 9FT ADLT (ELECTROSURGICAL) ×2
ELECTRODE REM PT RTRN 9FT ADLT (ELECTROSURGICAL) ×1 IMPLANT
GAUZE SPONGE 4X4 16PLY XRAY LF (GAUZE/BANDAGES/DRESSINGS) ×10 IMPLANT
GLOVE BIOGEL PI IND STRL 7.0 (GLOVE) ×3 IMPLANT
GLOVE BIOGEL PI INDICATOR 7.0 (GLOVE) ×3
GLOVE EUDERMIC 7 POWDERFREE (GLOVE) ×2 IMPLANT
GLOVE SURG SS PI 7.0 STRL IVOR (GLOVE) ×4 IMPLANT
GOWN STRL NON-REIN LRG LVL3 (GOWN DISPOSABLE) ×6 IMPLANT
GOWN STRL REIN XL XLG (GOWN DISPOSABLE) ×2 IMPLANT
HEMOSTAT SURGICEL 2X14 (HEMOSTASIS) ×2 IMPLANT
HEMOSTAT SURGICEL 2X4 FIBR (HEMOSTASIS) ×2 IMPLANT
KIT BASIN OR (CUSTOM PROCEDURE TRAY) ×2 IMPLANT
KIT ROOM TURNOVER OR (KITS) ×2 IMPLANT
NEEDLE HYPO 25GX1X1/2 BEV (NEEDLE) ×2 IMPLANT
NS IRRIG 1000ML POUR BTL (IV SOLUTION) ×2 IMPLANT
PACK SURGICAL SETUP 50X90 (CUSTOM PROCEDURE TRAY) ×2 IMPLANT
PAD ARMBOARD 7.5X6 YLW CONV (MISCELLANEOUS) ×4 IMPLANT
PENCIL BUTTON HOLSTER BLD 10FT (ELECTRODE) ×2 IMPLANT
SHEARS HARMONIC 9CM CVD (BLADE) ×2 IMPLANT
SPECIMEN JAR MEDIUM (MISCELLANEOUS) IMPLANT
SPONGE INTESTINAL PEANUT (DISPOSABLE) ×4 IMPLANT
SUT MNCRL AB 4-0 PS2 18 (SUTURE) ×2 IMPLANT
SUT SILK 2 0 (SUTURE) ×1
SUT SILK 2-0 18XBRD TIE 12 (SUTURE) ×1 IMPLANT
SUT SILK 3 0 (SUTURE) ×1
SUT SILK 3-0 18XBRD TIE 12 (SUTURE) ×1 IMPLANT
SUT VIC AB 3-0 SH 18 (SUTURE) ×4 IMPLANT
SYR BULB 3OZ (MISCELLANEOUS) ×2 IMPLANT
SYR CONTROL 10ML LL (SYRINGE) ×2 IMPLANT
TOWEL OR 17X24 6PK STRL BLUE (TOWEL DISPOSABLE) ×2 IMPLANT
TOWEL OR 17X26 10 PK STRL BLUE (TOWEL DISPOSABLE) ×2 IMPLANT
TUBE CONNECTING 12X1/4 (SUCTIONS) ×2 IMPLANT
WATER STERILE IRR 1000ML POUR (IV SOLUTION) IMPLANT

## 2013-02-14 NOTE — Anesthesia Postprocedure Evaluation (Signed)
  Anesthesia Post-op Note  Patient: Edgar Ross  Procedure(s) Performed: Procedure(s): THYROIDECTOMY (N/A)  Patient Location: PACU  Anesthesia Type:General  Level of Consciousness: awake and alert   Airway and Oxygen Therapy: Patient Spontanous Breathing  Post-op Pain: mild  Post-op Assessment: Post-op Vital signs reviewed, Patient's Cardiovascular Status Stable, Respiratory Function Stable, Patent Airway, No signs of Nausea or vomiting and Pain level controlled  Post-op Vital Signs: Reviewed and stable  Complications: No apparent anesthesia complications

## 2013-02-14 NOTE — Transfer of Care (Signed)
Immediate Anesthesia Transfer of Care Note  Patient: Edgar Ross  Procedure(s) Performed: Procedure(s): THYROIDECTOMY (N/A)  Patient Location: PACU  Anesthesia Type:General  Level of Consciousness: awake, alert  and oriented  Airway & Oxygen Therapy: Patient Spontanous Breathing and Patient connected to nasal cannula oxygen  Post-op Assessment: Report given to PACU RN, Post -op Vital signs reviewed and stable and Patient moving all extremities X 4  Post vital signs: Reviewed and stable  Complications: No apparent anesthesia complications

## 2013-02-14 NOTE — Interval H&P Note (Signed)
History and Physical Interval Note:  02/14/2013 6:54 AM  Edgar Ross  has presented today for surgery, with the diagnosis of thyroid nodules  The goals and the various methods of treatment have been discussed with the patient and family. After consideration of risks, benefits and other options for treatment, the patient has consented to  Procedure(s): THYROIDECTOMY (N/A) as a surgical intervention .  [name] history has been reviewed, patient examined today , no change in status, stable for surgery.  I have reviewed the patient's chart and labs.  Questions were answered to the patient's satisfaction.     [name]

## 2013-02-14 NOTE — Op Note (Signed)
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 Derrell Lolling, M.D., FACS  Assistant:                      Darnell Level, M.D., FACS  Operative Indications:   Edgar Ross is a 62 y.o. male. He returns for discussion of total thyroidectomy for enlarging multinodular goiter with tracheal deviation and esophageal pressure symptoms, intermittent.  This patient was originally scheduled for total thyroidectomy in June, but his chest x-ray showed a left lung mass. We canceled his thyroid surgery and he was referred to Dr. Tyrone Sage.   On August 6 he underwent left VATS, mini thoracotomy, wedge resection left upper lobe, lymph node dissection. Pathology was stage IB,   T2a., N0. He has recovered from that surgery and the decision has been made that he does not need any adjuvant therapy. He is feeling better and is interested in having his thyroidectomy done.  He still has intermittent swallowing problems where food on sticks in his throat but no vomiting or regurgitation. He noticed that his trachea and larynx are pushed over to the left. We reviewed his previous ultrasound showing enlargement of nodules on the right and benign FNAs. We reviewed his prior consultation with Dr. Lucianne Muss.  Family history is negative for multiple endocrine neoplasia. No radiation to head, neck or chest.  Mother had bone cancer. Father had unknown type of cancer.  History is significant for current smoking although he says he is down to 4 cigarettes a day, anxiety disorder, mild asthma   Operative Findings:       The patient had a huge but smooth and benign-appearing thyroid. The right upper lobe had a smooth mass and it was at least 6 cm in size and was significantly deviating the trachea to the left.  The right lower pole was smaller but at least 4 cm in size. The left thyroid lobe felt like it had some small nodules in it but it was of normal size.  Procedure in Detail:          Following the induction of general endotracheal anesthesia the patient was positioned with arms at his side and the neck extended. The neck was prepped and draped in a sterile fashion. Surgical time out was performed. 0.5% Marcaine with epinephrine was used as a local infiltration anesthetic. A transverse collar incision was made about 2 cm above the medial head of the clavicles. Dissection was carried down through the platysma muscle. Skin and platysma flaps raised superiorly and inferiorly and a self retaining retractor was placed. We divided the strap muscles in the midline . I initially  dissected the right lobe since it was the larger and more pathologic lobe. I kept the dissection right in the capsule of the thyroid gland because of its size. I felt this was the safest way to avoid injury to the parathyroids and the recurrent laryngeal nerve. We mobilized the lower pole somewhat and then we took the dissection  up both medial and lateral to the upper pole. Again staying right in the capsule of the thyroid when I took down the upper pole vessels sequentially. Larger vessels were clipped with a metal clip and divided. Small vessels were divided with harmonic scalpel. Then slowly dissected the thyroid gland from lateral to medial. We could see the region of the recurrent laryngeal nerve and stayed well away from that. Because of the technique of dissection I did not try to dissect it out but I did not try to identify the inferior parathyroid. We felt we saw the right superior parathyroid. I carried the dissection across the anterior trachea. A search was made  for a pyramidal lobe and there was none. I then dissected the strap muscles off of the smaller left lobe. I mobilized the lower pole up . Again using the harmonic scalpel and   staying very careful against the trachea. We took down the superior pole in a similar fashion staying right in the capsule of the thyroid gland. Small metal clips and harmonic scalpel was used. We felt that we identified the superior parathyroid on the left. The left lobe was then   dissected from lateral to medial. Again my technique was to stay right on the capsule and not to try to get into the paratracheal tissues and so I did not specifically dissect out the nerve and I did not specifically identify the inferior parathyroid gland  But we felt that we stayed well away from this. The specimen was marked with sutures in several areas to orient the pathologist to the superior and inferior poles. Hemostasis was excellent and achieved during the case with tiny clips and the harmonic scalpel. We irrigated the wound out. Couple of other small tracheal bleeders and muscle bleeders were controlled. We placed Fibrillar  hemostatic sponge in the wound and it was very dry. The strap muscles were closed in the midline with interrupted sutures of 3-0 Vicryl. Platysma muscles closed with 3-0 Vicryl sutures and skin closed with a running subcuticular suture of 4-0 Monocryl and Dermabond. The patient tolerated the procedure well and was taken to recovery room in stable condition. EBL 30 cc. Counts correct. Complications none.     Angelia Mould. Derrell Lolling, M.D., FACS General and Minimally Invasive Surgery Breast and Colorectal Surgery  02/14/2013 9:26 AM

## 2013-02-14 NOTE — Anesthesia Preprocedure Evaluation (Addendum)
Anesthesia Evaluation  Patient identified by MRN, date of birth, ID band Patient awake    Reviewed: Allergy & Precautions, H&P , NPO status , Patient's Chart, lab work & pertinent test results  Airway Mallampati: I TM Distance: >3 FB Neck ROM: Full    Dental  (+) Edentulous Upper and Edentulous Lower   Pulmonary shortness of breath, asthma , Current Smoker,  Lung ca breath sounds clear to auscultation        Cardiovascular Rhythm:Regular Rate:Normal     Neuro/Psych Anxiety Depression    GI/Hepatic GERD-  ,  Endo/Other    Renal/GU      Musculoskeletal   Abdominal   Peds  Hematology   Anesthesia Other Findings   Reproductive/Obstetrics                          Anesthesia Physical Anesthesia Plan  ASA: III  Anesthesia Plan: General   Post-op Pain Management:    Induction: Intravenous  Airway Management Planned: Oral ETT  Additional Equipment:   Intra-op Plan:   Post-operative Plan: Extubation in OR  Informed Consent: I have reviewed the patients History and Physical, chart, labs and discussed the procedure including the risks, benefits and alternatives for the proposed anesthesia with the patient or authorized representative who has indicated his/her understanding and acceptance.     Plan Discussed with: CRNA and Surgeon  Anesthesia Plan Comments:         Anesthesia Quick Evaluation

## 2013-02-15 LAB — CALCIUM: Calcium: 8.6 mg/dL (ref 8.4–10.5)

## 2013-02-15 MED ORDER — CALCIUM CARBONATE 1250 (500 CA) MG PO TABS: 1.0000 | ORAL_TABLET | Freq: Three times a day (TID) | ORAL | Status: DC

## 2013-02-15 MED ORDER — OXYCODONE-ACETAMINOPHEN 7.5-325 MG PO TABS: 1.0000 | ORAL_TABLET | ORAL | Status: DC | PRN

## 2013-02-15 MED ORDER — LEVOTHYROXINE SODIUM 100 MCG PO TABS: 100.0000 ug | ORAL_TABLET | Freq: Every day | ORAL | Status: DC

## 2013-02-15 NOTE — Discharge Summary (Signed)
Patient ID: Edgar Ross [id number] 62 y.o. 1951-01-13  Admit date: 02/14/2013  Discharge date and time: 02/15/2013  Admitting Physician: [name]  Discharge Physician: [name]  Admission Diagnoses: thyroid nodules  Discharge Diagnoses: nontoxic, multinodular goiter Adenocarcinoma of the left lung, left upper lobe, stage IB. Status post resection Anxiety disorder Gastroesophageal reflux disease     Operations: Procedure(s): THYROIDECTOMY, TOAAL  Admission Condition: good  Discharged Condition: good   Indication for Admission: Edgar Ross is a 62 y.o. male. He presents electively for total thyroidectomy for enlarging multinodular goiter with tracheal deviation and esophageal pressure symptoms, intermittent.  This patient was originally scheduled for total thyroidectomy in June, but his chest x-ray showed a left lung mass. We canceled his thyroid surgery and he was referred to Dr. Tyrone Sage. On August 6 he underwent left VATS, mini thoracotomy, wedge resection left upper lobe, lymph node dissection. Pathology was stage IB, T2a., N0. He has recovered from that surgery and the decision has been made that he does not need any adjuvant therapy. He is feeling better and is interested in having his thyroidectomy done.  He still has intermittent swallowing problems where food on sticks in his throat but no vomiting or regurgitation. He noticed that his trachea and larynx are pushed far over to the left. We reviewed his previous ultrasound showing enlargement of nodules on the right and benign FNAs. We reviewed his prior consultation with Dr. Lucianne Muss.  Family history is negative for multiple endocrine neoplasia. No radiation to head, neck or chest. Mother had bone cancer. Father had unknown type of cancer.  History is significant for current smoking although he says he is down to 4 cigarettes a day, anxiety disorder, mild asthma   Hospital Course: on the day of admission the  patient was taken to the operating room. He underwent a total thyroidectomy. The right lobe was extremely large and was deviating the trachea and larynx far to the left. The left side was of normal size. The gland was smooth and soft and felt benign. The surgery was uneventful. Postoperatively the patient did very well. He had no paresthesias. He had no trouble swallowing. Pain was well-controlled with Percocet. His voice was normal on the morning after surgery. Chvostek's sign  was negative bilaterally.  The thyroidectomy wound was soft. Slightly edematous but no hematoma or swelling. Calcium level preop was 9.0. Calcium level was 8.6 at 5 AM on postop day #1. He was discharged home on postop day 1. He was given a prescription for synthroid100 mcg per day, Os-Cal with D., 1 tablet 3 times daily, and Percocet. He was asked to return to see me in the office in 2-3 weeks and we'll check calcium and TSH level at that time. He was given instructions in diet and activities. He knows that he will need to take Synthroid for the rest of his life that this will need to  be regulated.  He was again encouraged to stop smoking.  Consults: None  Significant Diagnostic Studies: surgical pathology, pending, and laboratory testing  Treatments: surgery: total thyroidectomy  Disposition: Home  Patient Instructions:    Medication List    STOP taking these medications       HYDROcodone-acetaminophen 5-325 MG per tablet  Commonly known as:  NORCO/VICODIN      TAKE these medications       ARIPiprazole 5 MG tablet  Commonly known as:  ABILIFY  Take 5 mg by mouth every morning.     calcium carbonate  1250 MG tablet  Commonly known as:  OS-CAL - dosed in mg of elemental calcium  Take 1 tablet (500 mg of elemental calcium total) by mouth 3 (three) times daily with meals.     citalopram 20 MG tablet  Commonly known as:  CELEXA  Take 20 mg by mouth every morning.     esomeprazole 40 MG capsule  Commonly known  as:  NEXIUM  Take 40 mg by mouth daily as needed (heartburn).     levothyroxine 100 MCG tablet  Commonly known as:  SYNTHROID, LEVOTHROID  Take 1 tablet (100 mcg total) by mouth daily before breakfast.     oxyCODONE-acetaminophen 7.5-325 MG per tablet  Commonly known as:  PERCOCET  Take 1 tablet by mouth every 4 (four) hours as needed for pain.     traZODone 50 MG tablet  Commonly known as:  DESYREL  Take 50 mg by mouth at bedtime.        Activity: activity as tolerated Diet: low fat, low cholesterol diet Wound Care: none needed  Follow-up:  With Dr. Derrell Lolling in 2 weeks.  Signed: Angelia Mould. Derrell Lolling, M.D., FACS General and minimally invasive surgery Breast and Colorectal Surgery  02/15/2013, 6:19 AM

## 2013-02-15 NOTE — Progress Notes (Signed)
Patient given discharge instructions and all prescriptions. All questions answered. Patient instructed to call when ready to leave and someone will escort him out.

## 2013-02-17 ENCOUNTER — Telehealth (INDEPENDENT_AMBULATORY_CARE_PROVIDER_SITE_OTHER): Payer: Self-pay

## 2013-02-17 ENCOUNTER — Encounter (HOSPITAL_COMMUNITY): Payer: Self-pay | Admitting: General Surgery

## 2013-02-17 DIAGNOSIS — Z9889 Other specified postprocedural states: Secondary | ICD-10-CM

## 2013-02-17 DIAGNOSIS — E042 Nontoxic multinodular goiter: Secondary | ICD-10-CM

## 2013-02-17 NOTE — Telephone Encounter (Signed)
Left message for pt to let him know prior to his follow up he needs labs drawn.  He has an appointment 11/4

## 2013-02-17 NOTE — Telephone Encounter (Signed)
Message copied by Ivory Broad on Mon Feb 17, 2013 11:03 AM ------      Message from: [name]      Created: Sat Feb 15, 2013  6:18 AM       [name],      Edgar Ross is being discharged on Saturday. He has been given a prescription for Percocet 30 tablets, Synthroid 100 MCG per day, and Os-Cal one tablet 3 times a day            I need to see him in 2, or at most 3 weeks. Prior to the office visit I would like a serum calcium and a TSH level drawn.            hmi ------

## 2013-02-17 NOTE — Telephone Encounter (Signed)
Pt returning call.  Relayed message below.  Let pt know that [name] will be sending out paperwork for labs.  Pt states he will be on the look out for them.  Pt states he knows where [employer] is located.

## 2013-02-18 ENCOUNTER — Telehealth (INDEPENDENT_AMBULATORY_CARE_PROVIDER_SITE_OTHER): Payer: Self-pay

## 2013-02-18 NOTE — Telephone Encounter (Signed)
Called and spoke to the pt.  He is doing well.  I notified him of the pathology report and we will give him a copy at his next visit.

## 2013-02-18 NOTE — Telephone Encounter (Signed)
Message copied by Ivory Broad on Tue Feb 18, 2013 11:36 AM ------      Message from: [name]      Created: Mon Feb 17, 2013  5:58 PM       Please call Mr. Edgar Ross and let him know that his pathology report showed no cancer.            hmi            ----- Message -----         From: [facility]         Sent: 02/17/2013   2:51 PM           To: [name], MD                   ------

## 2013-02-24 ENCOUNTER — Telehealth: Payer: Self-pay | Admitting: *Deleted

## 2013-02-24 NOTE — Telephone Encounter (Signed)
I called patient to check in.  Patient has had thyroid surgery which revealed a benign goiter.  Patient reports that he is doing well and feels better since the goiter was removed.  He denies any questions or concerns at this time.  We reviewed his scheduled appointments.  I gave him my contact number and encouraged him to call me for any needs he may have.

## 2013-03-03 ENCOUNTER — Other Ambulatory Visit (INDEPENDENT_AMBULATORY_CARE_PROVIDER_SITE_OTHER): Payer: Self-pay | Admitting: General Surgery

## 2013-03-03 LAB — CALCIUM: Calcium: 9.5 mg/dL (ref 8.4–10.5)

## 2013-03-04 ENCOUNTER — Ambulatory Visit (INDEPENDENT_AMBULATORY_CARE_PROVIDER_SITE_OTHER): Payer: Medicaid Other | Admitting: General Surgery

## 2013-03-04 ENCOUNTER — Encounter (INDEPENDENT_AMBULATORY_CARE_PROVIDER_SITE_OTHER): Payer: Self-pay | Admitting: General Surgery

## 2013-03-04 VITALS — BP 130/80 | HR 60 | Temp 98.3°F | Resp 14 | Ht 75.0 in | Wt 169.0 lb

## 2013-03-04 DIAGNOSIS — E042 Nontoxic multinodular goiter: Secondary | ICD-10-CM

## 2013-03-04 MED ORDER — LEVOTHYROXINE SODIUM 125 MCG PO TABS: 125.0000 ug | ORAL_TABLET | Freq: Every day | ORAL | Status: DC

## 2013-03-04 NOTE — Progress Notes (Signed)
Patient ID: Edgar Ross, male   DOB: 1950/12/09, 62 y.o.   MRN: [id number] History: This patient underwent total thyroidectomy on 02/14/2013. Final pathology shows a benign  nontoxic hyperplastic multinodular goiter. He is taking 2 calcium tablets per day. Has no paresthesias. Calcium level  9.5 yesterday. He is taking Synthroid 100 mcg per day.TSH yesterday is 8.083, elevated.     He feels well. Swallowing normally. Says his tracheas back to the midline. Voice is normal. Doing well so far.  Exam: Patient looks well. No distress. Neck reveals a thyroidectomy scar healing uneventfully. No infection. No hematoma. Chvostek's sign negative bilaterally. Voice is strong.  Assessment: Thyromegaly secondary to benign multinodular goiter. . Doing well following recent total thyroidectomy Suspect normal parathyroid function Subclinical hypothyroidism Tobacco abuse, trying to quit  Anxiety disorder  Adenocarcinoma lung, left upper lobe, stage IB. Uneventful recovery following resection as described.   Plan: Increase the Synthroid to 125 mcg per day Decreased calcium to one tablet per day in the afternoon Return to see me in one month and check calcium and TSH level at that time I told him that eventually he would need to have either Dr. Melven Sartorius or Dr. Lucianne Muss to regulate his Synthroid dose long term.    Angelia Mould. Derrell Lolling, M.D., [facility] General and Minimally invasive Surgery Breast and Colorectal Surgery Office:   [phone] Pager:   [phone]

## 2013-03-04 NOTE — Patient Instructions (Addendum)
You are recovering from your thyroidectomy without any obvious surgical complications.  The final pathology shows no cancer, simply hyperplastic nodules and multinodular goiter.  Your calcium level today is normal.  Increase your Synthroid dose to 125 mcg per day. He will be given a new prescription today..  Decrease the calcium intake to 1 tablet a day, and take it later in the day.  Eventually you will need to have your thyroid medication regulated by Dr. Melven Sartorius or Dr. Lucianne Muss  Return to see Dr. Derrell Lolling in 4-5 weeks and we will get  blood work again.

## 2013-03-05 ENCOUNTER — Telehealth: Payer: Self-pay | Admitting: *Deleted

## 2013-03-05 NOTE — Telephone Encounter (Signed)
I called patient to check in and he reports that he is doing well.  He denies any questions, complaints or concerns at this time.  I encouraged him to call me for any needs.

## 2013-03-06 ENCOUNTER — Other Ambulatory Visit: Payer: Self-pay

## 2013-04-03 ENCOUNTER — Encounter: Payer: Self-pay | Admitting: Cardiothoracic Surgery

## 2013-04-03 ENCOUNTER — Ambulatory Visit
Admission: RE | Admit: 2013-04-03 | Discharge: 2013-04-03 | Disposition: A | Discharge status: Home / Self Care | Payer: Medicaid Other | Source: Ambulatory Visit | Attending: Cardiothoracic Surgery | Admitting: Cardiothoracic Surgery

## 2013-04-03 ENCOUNTER — Ambulatory Visit (INDEPENDENT_AMBULATORY_CARE_PROVIDER_SITE_OTHER): Payer: Medicaid Other | Admitting: Cardiothoracic Surgery

## 2013-04-03 DIAGNOSIS — Z9889 Other specified postprocedural states: Secondary | ICD-10-CM

## 2013-04-03 DIAGNOSIS — C3410 Malignant neoplasm of upper lobe, unspecified bronchus or lung: Secondary | ICD-10-CM

## 2013-04-03 DIAGNOSIS — C801 Malignant (primary) neoplasm, unspecified: Secondary | ICD-10-CM

## 2013-04-03 DIAGNOSIS — Z902 Acquired absence of lung [part of]: Secondary | ICD-10-CM

## 2013-04-03 NOTE — Patient Instructions (Signed)

## 2013-04-03 NOTE — Progress Notes (Signed)
Edgar Ross [facility] Medical Record #[id number] Date of Birth: Sep 24, 1950  Edgar Mention, MD [facility], MD  Chief Complaint:   PostOp Follow Up Visit 12/04/2012   PREOPERATIVE DIAGNOSIS: Left upper lobe lung nodule highly suspicious  for malignancy.  POSTOPERATIVE DIAGNOSIS: Left upper lobe lung nodule highly suspicious  for malignancy. 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  PROCEDURE PERFORMED: Bronchoscopy, left video-assisted thoracoscopy,  mini thoracotomy, wedge resection of left upper lobe, completion of left  upper lobectomy, and lymph node dissection and placement of On-Q device.  SURGEON: [name], MD  PATH:IB pT2a,pN0, cMo  ALK positive  adenocarcinoma   History of Present Illness:     The patient is now recovering from thyroidectomy. He's doing well, denies any hemoptysis or respiratory difficulty. He has significantly cut down on his smoking but unfortunately is still smoking a few cigarettes a day.    History  Smoking status  . Current Some Day Smoker -- 0.25 packs/day for 40 years  . Types: Cigarettes  Smokeless tobacco  . Never Used       Allergies  Allergen Reactions  . Suboxone [Buprenorphine Hcl-Naloxone Hcl] Nausea And Vomiting  . Vancomycin Hives    Only where applied.    Current Outpatient Prescriptions  Medication Sig Dispense Refill  . ARIPiprazole (ABILIFY) 5 MG tablet Take 5 mg by mouth every morning.       . calcium carbonate (OS-CAL - DOSED IN MG OF ELEMENTAL CALCIUM) 1250 MG tablet Take 1 tablet (500 mg of elemental calcium total) by mouth 3 (three) times daily with meals.  100 tablet  1  . citalopram (CELEXA) 20 MG tablet Take 20 mg by mouth every morning.       Marland Kitchen esomeprazole (NEXIUM) 40 MG capsule Take 40 mg by mouth daily as needed (heartburn).       Marland Kitchen levothyroxine (SYNTHROID) 125 MCG tablet Take 1 tablet (125 mcg total) by mouth daily before breakfast.  30 tablet  1  . oxyCODONE-acetaminophen  (PERCOCET) 7.5-325 MG per tablet Take 1 tablet by mouth every 4 (four) hours as needed for pain.  30 tablet  0  . traZODone (DESYREL) 50 MG tablet Take 50 mg by mouth at bedtime.       No current facility-administered medications for this visit.       Physical Exam: BP 113/79  Pulse 74  Resp 20  Ht 6\' 3"  (1.905 m)  Wt 169 lb (76.658 kg)  BMI 21.12 kg/m2  SpO2 97%  General appearance: alert and cooperative Neurologic: intact Heart: regular rate and rhythm, S1, S2 normal, no murmur, click, rub or gallop Lungs: clear to auscultation bilaterally Abdomen: soft, non-tender; bowel sounds normal; no masses,  no organomegaly Extremities: extremities normal, atraumatic, no cyanosis or edema and Homans sign is negative, no sign of DVT Wound: right chest tube sites and vats incision well healed Patient has no cervical or supraclavicular adenopathy  Diagnostic Studies & Laboratory data:         Recent Radiology Findings: Dg Chest 2 View  04/03/2013   CLINICAL DATA:  History of lung carcinoma on the left with partial lobectomy in August of 2014, followup  EXAM: CHEST  2 VIEW  COMPARISON:  Chest x-ray of 12/20/2012  FINDINGS: Aeration of the lungs has improved. No infiltrate or effusion is seen. Postoperative changes on the left are stable. Nipple shadows are noted  at both lung bases symmetrically. Heart size is stable. No bony abnormality is seen.  IMPRESSION: Improved aeration.  No active lung disease.   Electronically Signed   By: Edgar Ross M.D.   On: 04/03/2013 11:50    Dg Chest 2 View  12/20/2012   *RADIOLOGY REPORT*  Clinical Data: Lung cancer with previous surgery.  Follow-up.  CHEST - 2 VIEW  Comparison: 12/13/2011 and previous  Findings: Heart size is normal.  There has been previous pulmonary resection on the left.  There is tenting of the left diaphragm. There is chronic scarring in the right midlung.  Nipple shadows are present bilaterally. No residual pneumothorax.  IMPRESSION:  Previous pulmonary resection on the left.  Elevation of the left hemidiaphragm related to that.  No residual pneumothorax.   Original Report Authenticated By: Edgar Ross, M.D.     Recent Labs: Lab Results  Component Value Date   WBC 6.9 02/05/2013   HGB 14.5 02/05/2013   HCT 42.9 02/05/2013   PLT 257 02/05/2013   GLUCOSE 98 02/05/2013   ALT 43 02/05/2013   AST 35 02/05/2013   NA 140 02/05/2013   K 4.2 02/05/2013   CL 106 02/05/2013   CREATININE 0.86 02/05/2013   BUN 14 02/05/2013   CO2 25 02/05/2013   TSH 8.083* 03/03/2013   INR 0.94 12/02/2012    ADDITIONAL INFORMATION: 1. Tissue tumor block sent to [facility] for EGFR tyrosine kinase inhibitor (TKI) eligibility assay. Result: Alteration not detected. Please see [facility] report for full details. (RAH:caf 12/20/12) Edgar Abts MD Pathologist, Electronic Signature ( Signed 12/23/2012) 1. Tissue tumor block sent to [facility] for ALK gene rearrangement by FISH ([id number]). Result: ALK gene rearrangement detected. Please see [facility] report for full details. (RAH:gt, 12/18/12) Edgar Abts MD Pathologist, Electronic Signature ( Signed 12/18/2012) FINAL DIAGNOSIS Diagnosis 1. Lung, wedge biopsy/resection, Left upper lobe - INVASIVE ADENOCARCINOMA, SEE COMMENT. - LYMPHOVASCULAR INVASION IDENTIFIED. - INVASIVE TUMOR INVOLVES VISCERAL PLEURA. - SURGICAL MARGIN, NEGATIVE FOR TUMOR. - SEE TUMOR SYNOPTIC TEMPLATE BELOW. 2. Lung, resection (segmental or lobe), Left upper lobe - BENIGN LUNG TISSUE. - NEGATIVE FOR ATYPIA OR MALIGNANCY. 1 of 3 FINAL for COWEN, PESQUEIRA [phone]) Diagnosis(continued) - BRONCHO-VASCULAR MARGIN, NEGATIVE FOR TUMOR. 3. Lymph node, biopsy, 10L - ONE LYMPH NODE, NEGATIVE FOR TUMOR (0/1). 4. Lymph node, biopsy, 4L - ONE LYMPH NODE, NEGATIVE FOR TUMOR (0/1). Microscopic Comment 1. LUNG Specimen, including laterality: Left upper lobe lung Procedure: Lobectomy Specimen integrity (intact/disrupted):  Intact Tumor site: Subpleural Tumor focality: Unifocal Maximum tumor size (cm): 1.4 cm Histologic type: Adenocarcinoma (solid and clear cell types). Grade: Moderately differentiated/grade II Margins: Negative Visceral pleura invasion: Present Tumor extension: Confined to subpleural parenchyma Treatment effect (if treated with neoadjuvant therapy): None Lymph -Vascular invasion: Present Lymph nodes: Number examined - 2; Number N1 nodes positive 0; Number N2 nodes positive 0 TNM code: pT2, pN0, pMX Ancillary studies: Can be performed upon request Non-neoplastic lung: Anthracotic pigment deposition, pigmented pulmonary macrophages, peribronchiolar chronic inflammation. Edgar Ross   Assessment / Plan:     Patient doing well post op from resection of his stage IB pT2a,pN0, cMo  ALK positive  Adenocarcinoma.  , With stable postop chest x-ray Patient will return in 3 months for a CT scan of the chest, 6 months following resection A further discuss with the patient his continued tobacco use, he continues to make efforts to stop.    Gabi Mcfate B

## 2013-04-10 ENCOUNTER — Ambulatory Visit (INDEPENDENT_AMBULATORY_CARE_PROVIDER_SITE_OTHER): Payer: Medicaid Other | Admitting: General Surgery

## 2013-04-10 ENCOUNTER — Encounter (INDEPENDENT_AMBULATORY_CARE_PROVIDER_SITE_OTHER): Payer: Self-pay | Admitting: General Surgery

## 2013-04-10 VITALS — BP 138/80 | HR 66 | Temp 97.0°F | Ht 75.0 in | Wt 178.5 lb

## 2013-04-10 DIAGNOSIS — D1739 Benign lipomatous neoplasm of skin and subcutaneous tissue of other sites: Secondary | ICD-10-CM

## 2013-04-10 DIAGNOSIS — D171 Benign lipomatous neoplasm of skin and subcutaneous tissue of trunk: Secondary | ICD-10-CM

## 2013-04-10 DIAGNOSIS — E042 Nontoxic multinodular goiter: Secondary | ICD-10-CM

## 2013-04-10 NOTE — Progress Notes (Addendum)
Patient ID: Edgar Ross, male   DOB: 03-Dec-1950, 62 y.o.   MRN: [id number]  History:  This patient underwent total thyroidectomy on 02/14/2013. Final pathology shows a benign nontoxic hyperplastic multinodular goiter. He is taking 1 calcium tablet per day. Has no paresthesias.  He is taking Synthroid 125 mcg per days. He feels well. Swallowing normally. Says his tracheas back to the midline. Voice is normal. Doing well so far.  He had lab work at his primary care physician's office 2 days ago but we cannot get those results at this time. He states that his energy level is normal and he feels good. ADDENDUM:  TSH 4.800(nl 0.45-4.5) on 04/08/2013 - Dr. Margretta Ditty.  Exam:  Patient looks well. No distress.  Neck reveals a thyroidectomy scar healing uneventfully. No infection. No hematoma. Chvostek's sign negative bilaterally. Voice is strong. Trachea midline.  Assessment:  Thyromegaly secondary to benign multinodular goiter. . Doing well following recent total thyroidectomy  Suspect normal parathyroid function  Subclinical hypothyroidism  Tobacco abuse, trying to quit  Anxiety disorder  Adenocarcinoma lung, left upper lobe, stage IB. Uneventful recovery following resection as described. To followup with Dr. Tyrone Sage in 6 months.  Plan:  Continue the Synthroid 125 mcg per day  Continue calcium  one tablet per day in the afternoon  He states that his primary care physician will regulate his Synthroid dose from here on out. Return to see me as needed.   Angelia Mould. Derrell Lolling, M.D., [facility]  General and Minimally invasive Surgery  Breast and Colorectal Surgery  Office: [phone]  Pager: [phone]

## 2013-04-10 NOTE — Patient Instructions (Signed)
You have recovered from your total thyroidectomy without any obvious surgical complications.  The blood work that was drawn by your primary care doctor  on Tuesday is not available at this time. Please check with your primary care doctor to see if your Synthroid dose needs to be changed.  In the meantime, take the Synthroid 125 mcg per day In the morning before breakfast.  Take the 1 calcium tablet per day, later in the day.  Removal of the large lipoma of your left buttock is an elective operation that can be planned in the future. When you are ready to plan this operation, give [place] a call and we will schedule you for an MRI of the pelvis to help [place] plan this operation.  Otherwise, return to see Dr. Derrell Lolling as needed.

## 2013-04-15 ENCOUNTER — Encounter (INDEPENDENT_AMBULATORY_CARE_PROVIDER_SITE_OTHER): Payer: Self-pay

## 2013-04-15 ENCOUNTER — Telehealth (INDEPENDENT_AMBULATORY_CARE_PROVIDER_SITE_OTHER): Payer: Self-pay

## 2013-04-15 NOTE — Telephone Encounter (Signed)
Pt called to verify that he is to get his synthroid med and labs done with his PCP. Per Dr Jacinto Halim recent note pt advised yes he will f/u with his PCP. Pt states he understands and will call their office to set up labs.

## 2013-07-03 ENCOUNTER — Other Ambulatory Visit: Payer: Self-pay | Admitting: *Deleted

## 2013-07-03 DIAGNOSIS — C3410 Malignant neoplasm of upper lobe, unspecified bronchus or lung: Secondary | ICD-10-CM

## 2013-07-25 ENCOUNTER — Ambulatory Visit: Payer: Medicaid Other | Admitting: Cardiothoracic Surgery

## 2013-07-25 ENCOUNTER — Ambulatory Visit
Admission: RE | Admit: 2013-07-25 | Discharge: 2013-07-25 | Disposition: A | Discharge status: Home / Self Care | Payer: Medicaid Other | Source: Ambulatory Visit | Attending: Cardiothoracic Surgery | Admitting: Cardiothoracic Surgery

## 2013-07-25 DIAGNOSIS — C3410 Malignant neoplasm of upper lobe, unspecified bronchus or lung: Secondary | ICD-10-CM

## 2013-07-28 ENCOUNTER — Ambulatory Visit (INDEPENDENT_AMBULATORY_CARE_PROVIDER_SITE_OTHER): Payer: Medicaid Other | Admitting: Cardiothoracic Surgery

## 2013-07-28 ENCOUNTER — Encounter: Payer: Self-pay | Admitting: Cardiothoracic Surgery

## 2013-07-28 VITALS — BP 125/87 | HR 72 | Resp 16 | Ht 75.0 in | Wt 178.0 lb

## 2013-07-28 DIAGNOSIS — C3410 Malignant neoplasm of upper lobe, unspecified bronchus or lung: Secondary | ICD-10-CM

## 2013-07-28 DIAGNOSIS — Z902 Acquired absence of lung [part of]: Secondary | ICD-10-CM

## 2013-07-28 DIAGNOSIS — Z9889 Other specified postprocedural states: Secondary | ICD-10-CM

## 2013-07-28 NOTE — Progress Notes (Signed)
[facility] Medical Record #[id number] Date of Birth: 07-01-1950  Adin Hector, MD [facility], MD  Chief Complaint:   PostOp Follow Up Visit 12/04/2012   PREOPERATIVE DIAGNOSIS: Left upper lobe lung nodule highly suspicious  for malignancy.  POSTOPERATIVE DIAGNOSIS: Left upper lobe lung nodule highly suspicious  for malignancy. 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  PROCEDURE PERFORMED: Bronchoscopy, left video-assisted thoracoscopy,  mini thoracotomy, wedge resection of left upper lobe, completion of left  upper lobectomy, and lymph node dissection and placement of On-Q device.  SURGEON: Lanelle Bal, MD  PATH:IB pT2a,pN0, cMo  ALK positive  adenocarcinoma   History of Present Illness:     Patient is doing well, denies any hemoptysis or respiratory difficulty. He has significantly cut down on his smoking but unfortunately is still smoking a few cigarettes a day.    History  Smoking status  . Current Some Day Smoker -- 0.25 packs/day for 40 years  . Types: Cigarettes  Smokeless tobacco  . Never Used       Allergies  Allergen Reactions  . Suboxone [Buprenorphine Hcl-Naloxone Hcl] Nausea And Vomiting  . Vancomycin Hives    Only where applied.    Current Outpatient Prescriptions  Medication Sig Dispense Refill  . calcium carbonate (OS-CAL - DOSED IN MG OF ELEMENTAL CALCIUM) 1250 MG tablet Take 1 tablet (500 mg of elemental calcium total) by mouth 3 (three) times daily with meals.  100 tablet  1  . esomeprazole (NEXIUM) 40 MG capsule Take 40 mg by mouth daily as needed (heartburn).       Marland Kitchen HYDROcodone-acetaminophen (NORCO/VICODIN) 5-325 MG per tablet Take 1 tablet by mouth every 6 (six) hours as needed for moderate pain.      Marland Kitchen levothyroxine (SYNTHROID) 125 MCG tablet Take 1 tablet (125 mcg total) by mouth daily before breakfast.  30 tablet  1  . traZODone (DESYREL) 50 MG tablet Take 50 mg by mouth at bedtime.       No  current facility-administered medications for this visit.       Physical Exam: BP 125/87  Pulse 72  Resp 16  Ht 6' 3"  (1.905 m)  Wt 178 lb (80.74 kg)  BMI 22.25 kg/m2  SpO2 98%  General appearance: alert and cooperative Neurologic: intact Heart: regular rate and rhythm, S1, S2 normal, no murmur, click, rub or gallop Lungs: clear to auscultation bilaterally Abdomen: soft, non-tender; bowel sounds normal; no masses,  no organomegaly Extremities: extremities normal, atraumatic, no cyanosis or edema and Homans sign is negative, no sign of DVT Wound: right chest tube sites and vats incision well healed Patient has no cervical or supraclavicular adenopathy  Diagnostic Studies & Laboratory data:         Recent Radiology Findings: Ct Chest Wo Contrast  07/25/2013   CLINICAL DATA:  f/u lung ca /resection  EXAM: CT CHEST WITHOUT CONTRAST  TECHNIQUE: Multidetector CT imaging of the chest was performed following the standard protocol without IV contrast.  COMPARISON:  DG CHEST 2 VIEW dated 04/03/2013; CT CHEST W/CM dated 11/20/2012  FINDINGS: Patient is status post thyroidectomy. Thoracic inlets otherwise unremarkable.  Postsurgical changes within the left hilum and mediastinum consistent with left upper lobe partial lobectomy. There is no evidence of mediastinal or hilar masses or adenopathy.  Vague linear areas of density appreciated just below the infrahilar region on the left. These areas  have the appearance of scarring or possibly atelectasis, post radiation change not excluded if clinically appropriate.  Vague area of linear density base of the left lung anteriorly scar versus atelectasis.  Stable apical pleural scarring right lung apex. Stable areas of mild peripheral emphysema lateral base left upper lobe with adjacent areas of subpleural lobular septal thickening and scarring. Mild bullous changes appreciated within the right lung base. No masses or nodules are identified in the lungs.   Central airways are patent.  Visualized upper abdominal viscera demonstrate no abnormalities.  There is no evidence of aggressive appearing osseous lesions.  IMPRESSION: Postsurgical changes within the left hilar region. Area likely representing scarring or possibly atelectasis within the central left lower lobe. No CT evidence of recurrent or metastatic disease.  Stable areas of scarring, stable mild emphysematous and bullous changes within the right hemi thorax.   Electronically Signed   By: Margaree Mackintosh M.D.   On: 07/25/2013 14:22  Dg Chest 2 View  12/20/2012   *RADIOLOGY REPORT*  Clinical Data: Lung cancer with previous surgery.  Follow-up.  CHEST - 2 VIEW  Comparison: 12/13/2011 and previous  Findings: Heart size is normal.  There has been previous pulmonary resection on the left.  There is tenting of the left diaphragm. There is chronic scarring in the right midlung.  Nipple shadows are present bilaterally. No residual pneumothorax.  IMPRESSION: Previous pulmonary resection on the left.  Elevation of the left hemidiaphragm related to that.  No residual pneumothorax.   Original Report Authenticated By: Nelson Chimes, M.D.     Recent Labs: Lab Results  Component Value Date   WBC 6.9 02/05/2013   HGB 14.5 02/05/2013   HCT 42.9 02/05/2013   PLT 257 02/05/2013   GLUCOSE 98 02/05/2013   ALT 43 02/05/2013   AST 35 02/05/2013   NA 140 02/05/2013   K 4.2 02/05/2013   CL 106 02/05/2013   CREATININE 0.86 02/05/2013   BUN 14 02/05/2013   CO2 25 02/05/2013   TSH 8.083* 03/03/2013   INR 0.94 12/02/2012    ADDITIONAL INFORMATION: 1. Tissue tumor block sent to [facility] for EGFR tyrosine kinase inhibitor (TKI) eligibility assay. Result: Alteration not detected. Please see [facility] report for full details. (RAH:caf 12/20/12) Willeen Niece MD Pathologist, Electronic Signature ( Signed 12/23/2012) 1. Tissue tumor block sent to [facility] for ALK gene rearrangement by [place] ([id number]). Result: ALK gene  rearrangement detected. Please see [facility] report for full details. (RAH:gt, 12/18/12) Willeen Niece MD Pathologist, Electronic Signature ( Signed 12/18/2012) FINAL DIAGNOSIS Diagnosis 1. Lung, wedge biopsy/resection, Left upper lobe - INVASIVE ADENOCARCINOMA, SEE COMMENT. - LYMPHOVASCULAR INVASION IDENTIFIED. - INVASIVE TUMOR INVOLVES VISCERAL PLEURA. - SURGICAL MARGIN, NEGATIVE FOR TUMOR. - SEE TUMOR SYNOPTIC TEMPLATE BELOW. 2. Lung, resection (segmental or lobe), Left upper lobe - BENIGN LUNG TISSUE. - NEGATIVE FOR ATYPIA OR MALIGNANCY. 1 of 3 FINAL for STEEL, KERNEY ([phone]) Diagnosis(continued) - BRONCHO-VASCULAR MARGIN, NEGATIVE FOR TUMOR. 3. Lymph node, biopsy, 10L - ONE LYMPH NODE, NEGATIVE FOR TUMOR (0/1). 4. Lymph node, biopsy, 4L - ONE LYMPH NODE, NEGATIVE FOR TUMOR (0/1). Microscopic Comment 1. LUNG Specimen, including laterality: Left upper lobe lung Procedure: Lobectomy Specimen integrity (intact/disrupted): Intact Tumor site: Subpleural Tumor focality: Unifocal Maximum tumor size (cm): 1.4 cm Histologic type: Adenocarcinoma (solid and clear cell types). Grade: Moderately differentiated/grade II Margins: Negative Visceral pleura invasion: Present Tumor extension: Confined to subpleural parenchyma Treatment effect (if treated with neoadjuvant therapy): None Lymph -Vascular invasion: Present Lymph nodes: Number examined -  2; Number N1 nodes positive 0; Number N2 nodes positive 0 TNM code: pT2, pN0, pMX Ancillary studies: Can be performed upon request Non-neoplastic lung: Anthracotic pigment deposition, pigmented pulmonary macrophages, peribronchiolar chronic inflammation. [place] RUND DO   Assessment / Plan:     Patient doing well post op from resection of his stage IB pT2a,pN0, cMo  ALK positive  Adenocarcinoma.   Patient returns now for for a CT scan of the chest, 6 months following resection, it shows no evidence of recurrence A further discuss with  the patient his continued tobacco use, he continues to make efforts to stop. We'll plan to see him back with a followup CT scan in 6 months, which will be approximately one year postop  Grace Isaac MD      [address] [place] [address] Office [phone]   Beeper [phone]

## 2013-08-08 ENCOUNTER — Encounter: Payer: Self-pay | Admitting: *Deleted

## 2013-08-08 NOTE — [facility] Oncology Navigator Note (Signed)
I called patient to check in.  Patient reports that he is doing very well.  He feels fully recovered from his surgery. He continues to smoke but has reduced to ~ 1 pack per week.  He smokes electronic cigarettes which have helped him reduce the number of regular cigarettes.  We discussed smoking cigarettes with a lower nicotine level and setting a quit date.  I also told him about smoking cessation classes and advised him to let me know if he wants additional information.  He denied any questions or concerns at this time.  I encouraged him to call me for any needs.

## 2013-08-15 ENCOUNTER — Encounter (HOSPITAL_COMMUNITY): Payer: Self-pay

## 2013-10-16 ENCOUNTER — Telehealth (INDEPENDENT_AMBULATORY_CARE_PROVIDER_SITE_OTHER): Payer: Self-pay

## 2013-10-16 NOTE — Telephone Encounter (Signed)
Pt called in stating he is getting refills of his synthroid but thought he needed monthly lab dry. Advised he should not need labs drawn monthly. Advised him he should call PCP and ask how often he needs labs drawn. He is asking if we received referral from PCP for lipoma on buttock. Advised I do not see one in system and that he would have been contacted for an appointment if we did. He will call PCP to get referral again.

## 2013-10-22 ENCOUNTER — Encounter: Payer: Self-pay | Admitting: Cardiology

## 2013-11-24 ENCOUNTER — Encounter (INDEPENDENT_AMBULATORY_CARE_PROVIDER_SITE_OTHER): Payer: Medicaid Other | Admitting: General Surgery

## 2013-12-09 ENCOUNTER — Other Ambulatory Visit: Payer: Self-pay | Admitting: *Deleted

## 2013-12-09 DIAGNOSIS — C3412 Malignant neoplasm of upper lobe, left bronchus or lung: Secondary | ICD-10-CM

## 2014-01-22 ENCOUNTER — Other Ambulatory Visit: Payer: Medicaid Other

## 2014-01-22 ENCOUNTER — Ambulatory Visit: Payer: Medicaid Other | Admitting: Cardiothoracic Surgery

## 2014-01-22 ENCOUNTER — Encounter: Payer: Self-pay | Admitting: *Deleted

## 2014-01-22 NOTE — Progress Notes (Signed)
Marland KitchenMarland KitchenMarland Kitchen.. Cancer Treatment Summary and Follow-Up Care Plan Provided by [facility] on 01/22/14   This Survivorship Care Plan summarizes information about your diagnosis, follow-up care, symptoms to watch for, and steps you can take to stay healthy.  The information in this care plan will be important for you to keep so that doctors and other health care providers that you see in the future will have information about your cancer, its treatment, and how best to work with you to monitor your health.  In the future, your healthcare providers may need more details about your cancer and how you were treated.  This Survivorship Care Plan may help you locate information related to your treatment.  Resources for cancer survivors are listed as part of the [place] so that you may obtain additional information and identify support services immediately or in the future. General Information  Patient Name: Edgar Ross  Patient ID: [id number]  Phone: [phone] (home)   Date of Birth: Jan 30, 1951  Age of Diagnosis: 63 y.o.  Support Contact: Extended Emergency Contact Information Primary Emergency Contact: [place],Marion Address: [place], [place] [address] [place] Phone: [phone] Mobile Phone: [phone] Relation: Mother Secondary Emergency Contact: Hursey,Brenda Address: [address] [place], [place] [address] [place] Phone: [phone] Mobile Phone: [phone] Relation: Significant other    Treatment Summary   Treatment Provider Summary  Surgery  Dr. Servando Snare    Survivorship Care Plan  Lung Cancer Survivorship Date of treatment completed - 12/04/12     Time Month Provider Labs Scans  56mo Dr. GServando Snare CT Chest  6 year  Dr. GServando Snare CT Chest  9 mo  Dr. GServando Snare CT Chest  1 year  Dr. GServando Snare CT Chest  1 y 641 mo Dr. GServando Snare CT Chest  2 y  Dr. GServando Snare CT Chest  2y 6  mo  Dr. GServando Snare CT Chest  3 years  Dr. GServando Snare CT Chest  3 y 6 mo  Dr. GServando Snare CT Chest  4year  Dr. GServando Snare CT Chest  4y 6 mo  Dr. GServando Snare CT Chest  540y  Dr. GServando Snare CT Chest  Annually  Dr. GServando Snare CT Chest    Preventative Care Recommendations Get regular screenings for all body systems at frequencies indicated  Study/Test and Recommendations Frequency Most Recently Completed Next Due  Annual Physical   Primary Care Provider   Should include skin examination Annually    Colonoscopy   Beginning at age [age]unless clinically indicated to begin sooner Every 10 Years    Prostate Exam (Men)   Beginning at age 63  Talk with your Primary Care Provider about the pros and cons of testing so you can decide if testing is the right choice for you Discuss withELMAHDY,WAGDY, MD  Discuss withELMAHDY,WAGDY, MD    Healthy Lifestyle Recommendations  As a cancer survivor, it will be important to maintain a lifelong commitment to a healthy lifestyle.   Maintain a healthy weight   Exercise often per your doctor's orders   [place] diet high in fruits, vegetables, bran and fiber   Limit how much alcohol you consume, if at all   Osteoporosis screening   Cardiovascular disease screening   Stop smoking (if you smoke)  Know yourself, your family history, and your risks   Be mindful of your emotional, social, and spiritual needs   Establish with a Primary Care Provider if you do not already have one   Attend Journey to a Healthy Lifestyle cancer survivorship classes      Side Effects of Surgery  Possible Long-Term   Scars   Chronic Pain  Possible Late-Term (five or more years after treatment)   Lymphedema or Swelling of the Arms, Legs, Neck and Chest Region   Other Possible Effects of Cancer    Psychosocial Distress and Depression   Changes in Sex Life   Changes in Social and Work Relationships   Changes in Spirituality  At each visit, mention to your provider any  late-term effects and issues that you are experiencing.   Symptoms to Watch for and Report to Your Provider    Return of the cancer symptoms you had before- such as a lump or new growth where your cancer first started   New or unusual pain that seems unrelated to an injury and does not go away, including back pain or bone pain   Weight loss without trying/intending   Unexplained bleeding   A rash or allergic reaction, such as swelling, sever itching or wheezing   Chills or fevers   Persistent headaches   Shortness of breath or difficulty breathing   Bloody Stools or blood in your urine   Lumps, bumps, swelling and/or nipple discharge   Nausea, vomiting, diarrhea, loss of appetite, or trouble swallowing   A cough that doesn't go away   Abdominal pain   Swelling in your arms or legs   Fractures   Hot flashes or other menopausal symptoms   Any other signs mentioned by your doctor or nurse or any unusual symptoms                 that you just can't explain            Just because you have certain symptoms, it doesn't mean the cancer has come back. Symptoms can be due to other problems that need to be addressed.   Other Needs/Concerns and Suggested Intervention(s)  Depression:  Referral for psychiatric care or counseling and/or initiation of pharmacotherapy.  General Anxiety: Referral to support groups:  [place] www.cancer.Radonna Ricker 1.800.227.23  [place] local resources: (531)837-3608  Concerns about risk of Recurrence:  Patients need to know that concerns/feelings of recurrent disease are common  Referral to support groups:  [place] www.cancer.org, 1.(205) 159-8161  Screening for other Cancers:  Cancer diagnosis does not change screening practices for other cancers.  Refer to the Preventative Care section for scheduling/guideline details.  Insomnia: As appropriate:  Instructions on sleep hygiene  Pain evaluation  Hot flash  management  Treatment for anxiety  Fatigue:  Regular physical activity- walking 20 minutes daily  Evaluation for hypothyroidism, anemia, depression  Memory problems and/or confusion:  Patients should know that 25% of cancer patients have cognitive dysfunction after treatment and it usually gets better over time  Rule out depression, sleep disturbance  Decreased range of motion:  Referral to Physical Therapy  Joint aches and pains: Symptom management  Refer to Physical therapy   Other persistent pain:  Treatment for pain and/or referral to pain management specialist   Intimacy and sexuality:  Evaluation and treatment for anxiety, depression, as appropriate  Refer to sexual counselor and/or GYN.  Consider a support group to address body image concerns,  such as breast asymmetry, prosthesis:  American Cancer Society's Look Good ... Feel Better program (www.cancer.org, 1.(562) 818-7769)  Marital/partner/family relationships:  Referral to Social Worker  Genetic Risk:  Given compelling family history of breast cancer:  Referral for genetic testing (BRCA)  Referral for counseling by a medical oncologist and genetic counselor.  Referral to Gynecologist for ovarian cancer risk-evaluation  Employment, Scientist, product/process development and Finances  Referral to Education officer, museum  Wellness- Diet, Exercise, Smoking Cessation  Maintain a healthy body weight  Regular physical activity (walking 20 minutes daily)  Avoidance of smoking/smoking cessation counseling, if appropriate  Limitation of alcohol intake to less than one drink, two to three times a week

## 2014-01-23 ENCOUNTER — Telehealth: Payer: Self-pay | Admitting: *Deleted

## 2014-03-23 ENCOUNTER — Encounter: Payer: Self-pay | Admitting: *Deleted

## 2014-03-23 NOTE — [facility] Oncology Navigator Note (Signed)
Called patient to check in.  One listed number is a wrong number and the other number is unable to accept calls or take messages at this time.

## 2014-08-15 IMAGING — CT CT CHEST W/O CM
3 of 4 series · 17 of 30 positions shown, 19 images · non-contrast
Comparison: DG CHEST 2 VIEW dated 04/03/2013; CT CHEST W/CM dated
11/20/2012

CLINICAL DATA: f/u lung ca /resection

EXAM:
CT CHEST WITHOUT CONTRAST
TECHNIQUE: Multidetector CT imaging of the chest was performed following the
standard protocol without IV contrast.

[Series 3: chest w/o · axial · non-contrast · 0.70mm/px · z∈[-251,-11]mm · 5 of 73 slices shown, 7 images]
[im 13/73  mediastinal]
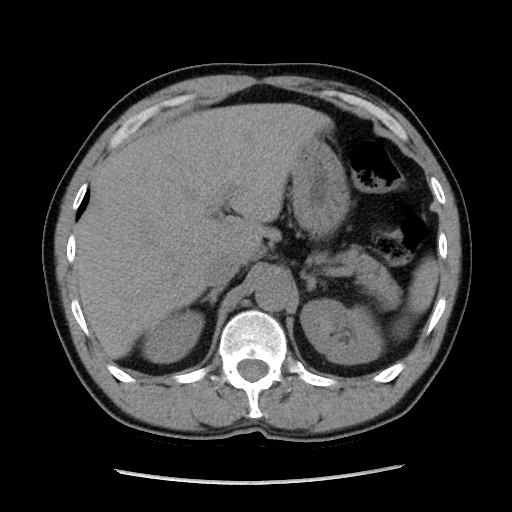
[im 13/73  lung]
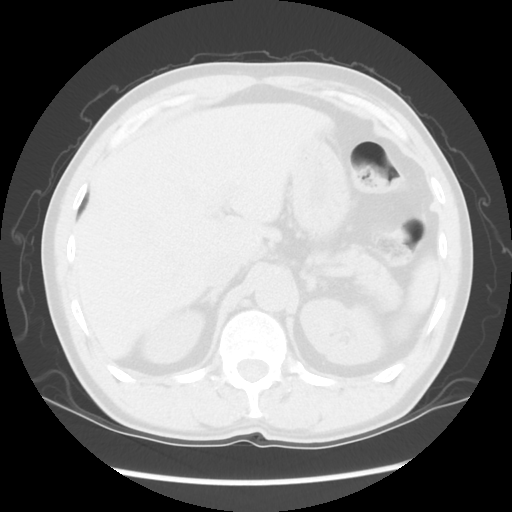
[im 25/73  lung]
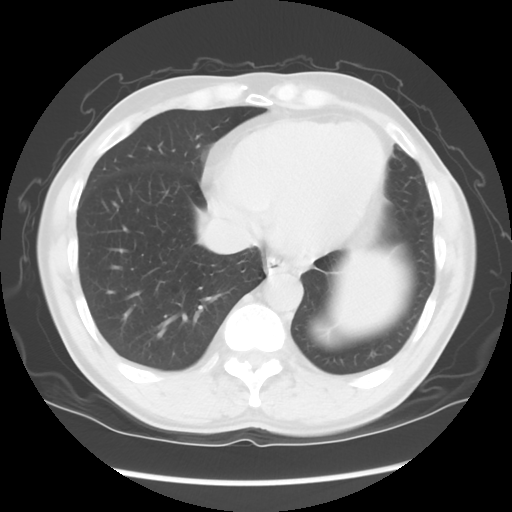
[im 37/73  lung]
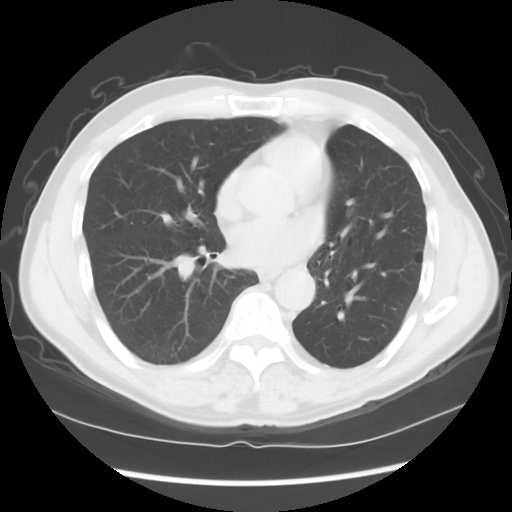
[im 49/73  lung]
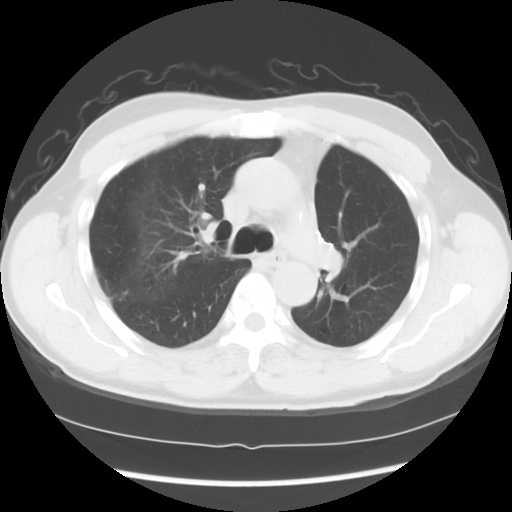
[im 61/73  mediastinal]
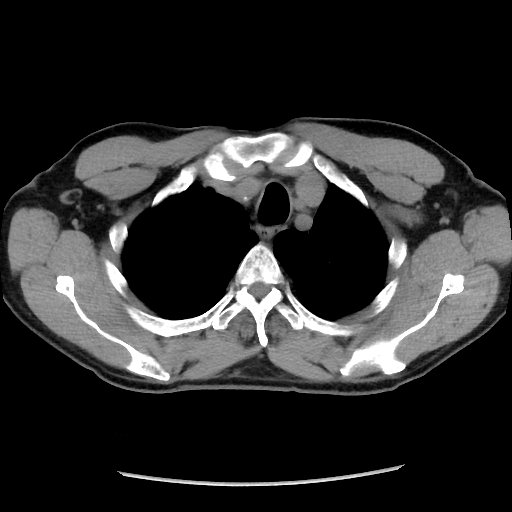
[im 61/73  lung]
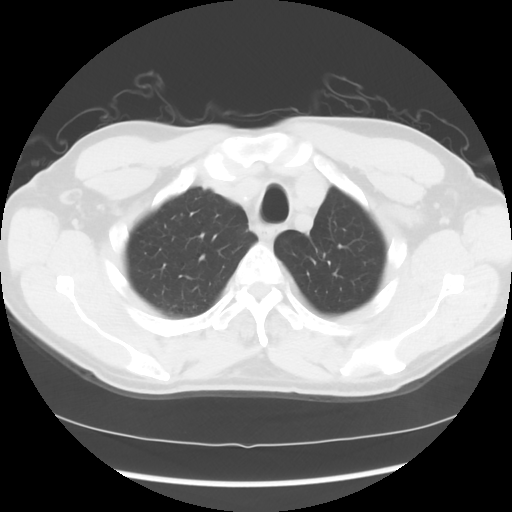

[Series 4: lung windows · axial · 0.70mm/px · z∈[-251,-11]mm · 5 of 73 slices shown]
[im 13/73  lung]
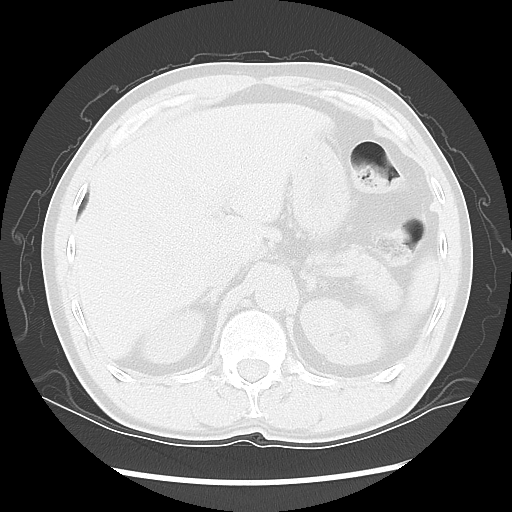
[im 25/73  lung]
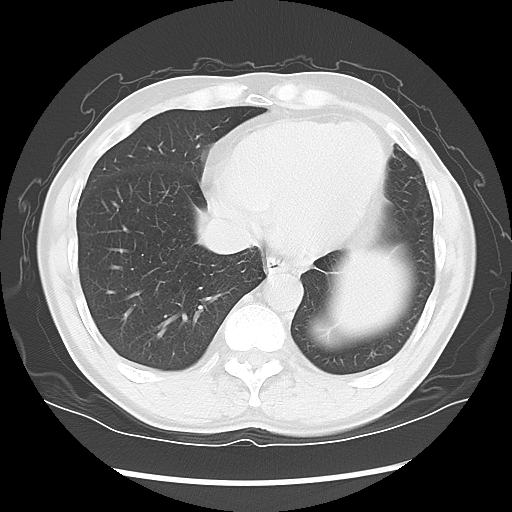
[im 37/73  lung]
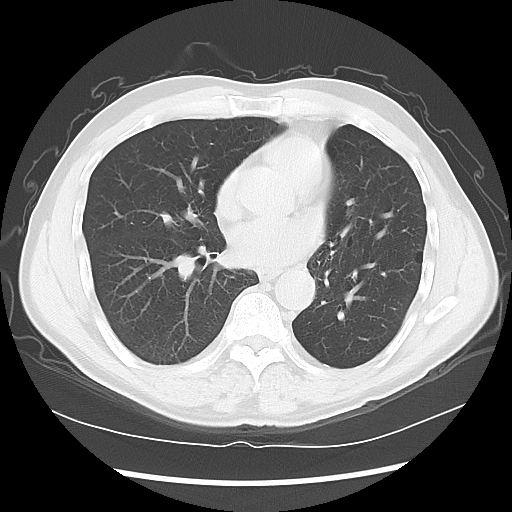
[im 49/73  lung]
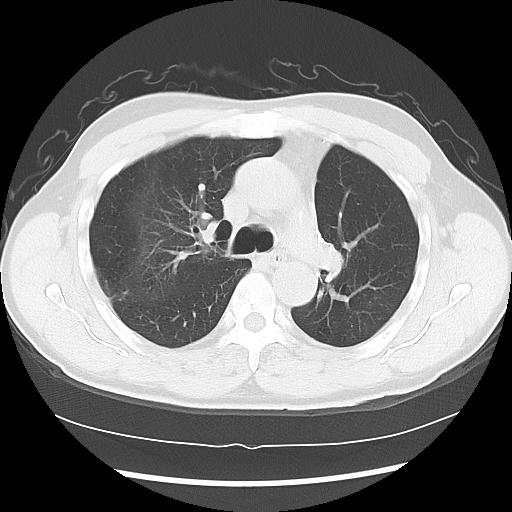
[im 61/73  lung]
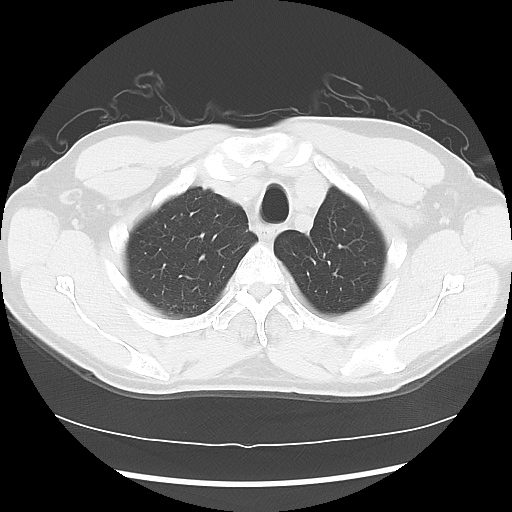

[Series 602: sagittal body · sagittal · 0.71mm/px · 7 of 145 slices shown]
[im 12/145  mediastinal]
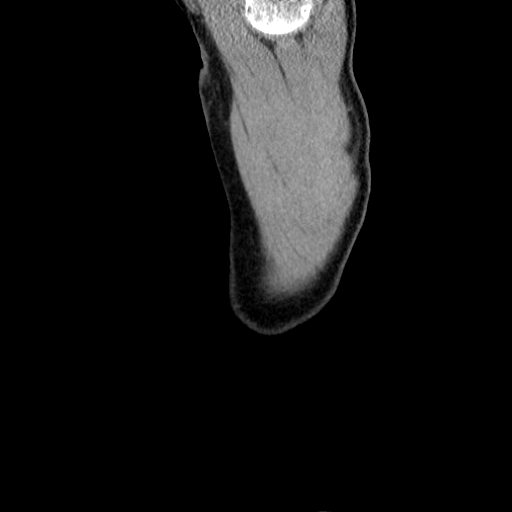
[im 34/145  mediastinal]
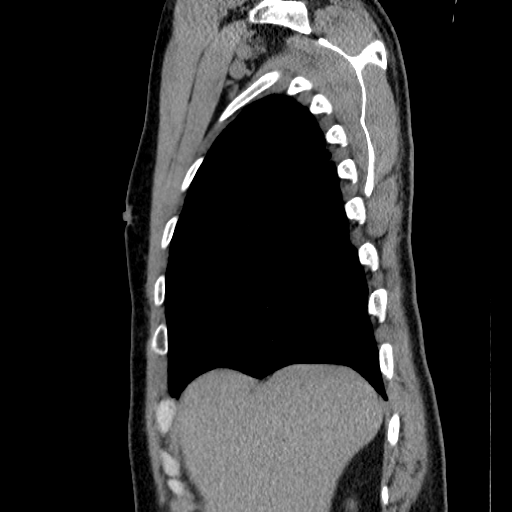
[im 45/145  mediastinal]
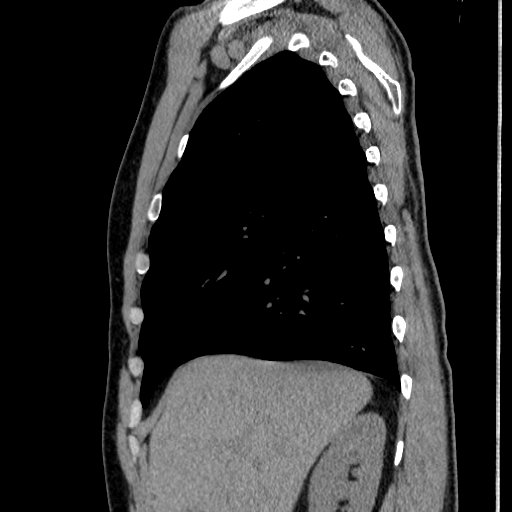
[im 67/145  mediastinal]
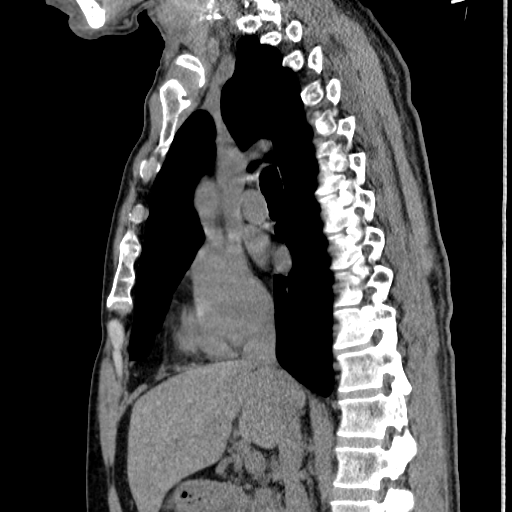
[im 78/145  mediastinal]
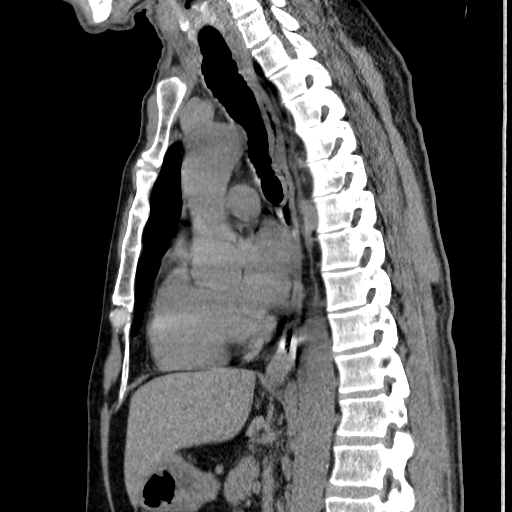
[im 100/145  mediastinal]
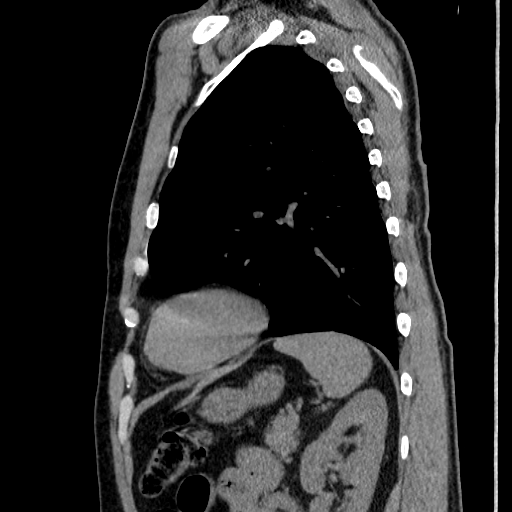
[im 111/145  mediastinal]
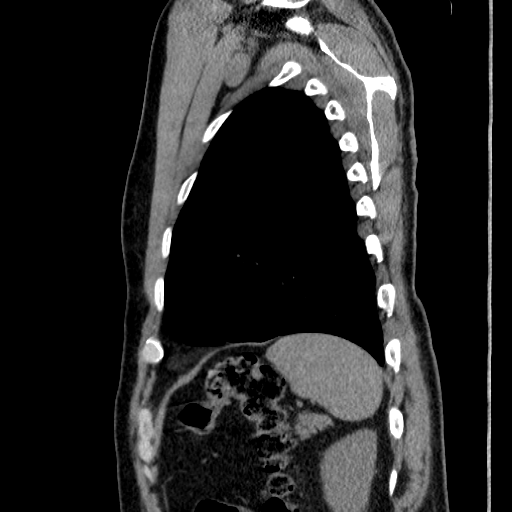

[17 of 30 positions shown; findings below may reference images not displayed]

FINDINGS: Patient is status post thyroidectomy. Thoracic inlets otherwise
unremarkable.

Postsurgical changes within the left hilum and mediastinum
consistent with left upper lobe partial lobectomy. There is no
evidence of mediastinal or hilar masses or adenopathy.

Vague linear areas of density appreciated just below the infrahilar
region on the left. These areas have the appearance of scarring or
possibly atelectasis, post radiation change not excluded if
clinically appropriate.

Vague area of linear density base of the left lung anteriorly scar
versus atelectasis.

Stable apical pleural scarring right lung apex. Stable areas of mild
peripheral emphysema lateral base left upper lobe with adjacent
areas of subpleural lobular septal thickening and scarring. Mild
bullous changes appreciated within the right lung base. No masses or
nodules are identified in the lungs.

Central airways are patent.

Visualized upper abdominal viscera demonstrate no abnormalities.

There is no evidence of aggressive appearing osseous lesions.
IMPRESSION: Postsurgical changes within the left hilar region. Area likely
representing scarring or possibly atelectasis within the central
left lower lobe. No CT evidence of recurrent or metastatic disease.

Stable areas of scarring, stable mild emphysematous and bullous
changes within the right hemi thorax.

## 2014-10-19 ENCOUNTER — Telehealth: Payer: Self-pay | Admitting: *Deleted

## 2014-10-19 NOTE — Telephone Encounter (Signed)
Contacted patient at phone number in recent request for information.  Patient has moved to [place].  Patient reports that he is doing well.  Recent scans performed in [place] show that his lungs are stable.  I gave patient my contact information should he have any needs in [place].

## 2015-03-03 NOTE — Telephone Encounter (Signed)
[facility] Navigator contact

## 2015-04-23 ENCOUNTER — Telehealth: Payer: Self-pay | Admitting: *Deleted

## 2015-04-23 NOTE — Telephone Encounter (Signed)
  Oncology Nurse Navigator Documentation    Navigator Encounter Type: Telephone (04/23/15 1129)     Barriers/Navigation Needs: No barriers at this time (04/23/15 1129)   Interventions: None required (04/23/15 1129)  I called patient to check in.  Patient continues to live in [place] and reports that she has been receiving regular medical care and all tests have been stable.  He denies any needs at this time.  I encouraged him to call if there is anything in [place] he needs.         Time Spent with Patient: 15 (04/23/15 1129)

## 2016-08-19 ENCOUNTER — Observation Stay (HOSPITAL_COMMUNITY)
Admission: EM | Admit: 2016-08-19 | Discharge: 2016-08-20 | Disposition: A | Discharge status: Home / Self Care | Payer: Medicaid Other | Attending: Internal Medicine | Admitting: Internal Medicine

## 2016-08-19 ENCOUNTER — Emergency Department (HOSPITAL_COMMUNITY): Payer: Medicaid Other

## 2016-08-19 ENCOUNTER — Encounter (HOSPITAL_COMMUNITY): Payer: Self-pay | Admitting: Emergency Medicine

## 2016-08-19 DIAGNOSIS — C787 Secondary malignant neoplasm of liver and intrahepatic bile duct: Secondary | ICD-10-CM | POA: Insufficient documentation

## 2016-08-19 DIAGNOSIS — K219 Gastro-esophageal reflux disease without esophagitis: Secondary | ICD-10-CM | POA: Insufficient documentation

## 2016-08-19 DIAGNOSIS — M545 Low back pain, unspecified: Secondary | ICD-10-CM | POA: Diagnosis present

## 2016-08-19 DIAGNOSIS — C3412 Malignant neoplasm of upper lobe, left bronchus or lung: Secondary | ICD-10-CM | POA: Diagnosis not present

## 2016-08-19 DIAGNOSIS — C189 Malignant neoplasm of colon, unspecified: Secondary | ICD-10-CM | POA: Insufficient documentation

## 2016-08-19 DIAGNOSIS — E890 Postprocedural hypothyroidism: Secondary | ICD-10-CM | POA: Diagnosis not present

## 2016-08-19 DIAGNOSIS — I10 Essential (primary) hypertension: Secondary | ICD-10-CM | POA: Insufficient documentation

## 2016-08-19 DIAGNOSIS — C7951 Secondary malignant neoplasm of bone: Secondary | ICD-10-CM | POA: Diagnosis not present

## 2016-08-19 DIAGNOSIS — I493 Ventricular premature depolarization: Secondary | ICD-10-CM | POA: Diagnosis not present

## 2016-08-19 DIAGNOSIS — Z981 Arthrodesis status: Secondary | ICD-10-CM | POA: Insufficient documentation

## 2016-08-19 DIAGNOSIS — G893 Neoplasm related pain (acute) (chronic): Secondary | ICD-10-CM | POA: Diagnosis not present

## 2016-08-19 DIAGNOSIS — F329 Major depressive disorder, single episode, unspecified: Secondary | ICD-10-CM | POA: Insufficient documentation

## 2016-08-19 DIAGNOSIS — E039 Hypothyroidism, unspecified: Secondary | ICD-10-CM | POA: Diagnosis not present

## 2016-08-19 DIAGNOSIS — Z923 Personal history of irradiation: Secondary | ICD-10-CM | POA: Insufficient documentation

## 2016-08-19 DIAGNOSIS — F17210 Nicotine dependence, cigarettes, uncomplicated: Secondary | ICD-10-CM | POA: Insufficient documentation

## 2016-08-19 DIAGNOSIS — Z85118 Personal history of other malignant neoplasm of bronchus and lung: Secondary | ICD-10-CM | POA: Diagnosis not present

## 2016-08-19 DIAGNOSIS — G4700 Insomnia, unspecified: Secondary | ICD-10-CM | POA: Diagnosis not present

## 2016-08-19 DIAGNOSIS — Z79899 Other long term (current) drug therapy: Secondary | ICD-10-CM | POA: Insufficient documentation

## 2016-08-19 DIAGNOSIS — Z902 Acquired absence of lung [part of]: Secondary | ICD-10-CM | POA: Diagnosis not present

## 2016-08-19 DIAGNOSIS — C3490 Malignant neoplasm of unspecified part of unspecified bronchus or lung: Secondary | ICD-10-CM

## 2016-08-19 DIAGNOSIS — C3410 Malignant neoplasm of upper lobe, unspecified bronchus or lung: Secondary | ICD-10-CM | POA: Diagnosis present

## 2016-08-19 LAB — COMPREHENSIVE METABOLIC PANEL
ALT: 32 U/L (ref 17–63)
AST: 35 U/L (ref 15–41)
Albumin: 3.2 g/dL — ABNORMAL LOW (ref 3.5–5.0)
Alkaline Phosphatase: 151 U/L — ABNORMAL HIGH (ref 38–126)
Anion gap: 10 (ref 5–15)
BILIRUBIN TOTAL: 0.5 mg/dL (ref 0.3–1.2)
BUN: 10 mg/dL (ref 6–20)
CALCIUM: 8.9 mg/dL (ref 8.9–10.3)
CHLORIDE: 106 mmol/L (ref 101–111)
CO2: 24 mmol/L (ref 22–32)
CREATININE: 0.69 mg/dL (ref 0.61–1.24)
Glucose, Bld: 89 mg/dL (ref 65–99)
Potassium: 3 mmol/L — ABNORMAL LOW (ref 3.5–5.1)
Sodium: 140 mmol/L (ref 135–145)
TOTAL PROTEIN: 6.4 g/dL — AB (ref 6.5–8.1)

## 2016-08-19 LAB — CBC WITH DIFFERENTIAL/PLATELET
Basophils Absolute: 0 10*3/uL (ref 0.0–0.1)
Basophils Relative: 0 %
EOS PCT: 3 %
Eosinophils Absolute: 0.1 10*3/uL (ref 0.0–0.7)
HEMATOCRIT: 34.9 % — AB (ref 39.0–52.0)
Hemoglobin: 11.3 g/dL — ABNORMAL LOW (ref 13.0–17.0)
LYMPHS ABS: 1.3 10*3/uL (ref 0.7–4.0)
LYMPHS PCT: 30 %
MCH: 27.7 pg (ref 26.0–34.0)
MCHC: 32.4 g/dL (ref 30.0–36.0)
MCV: 85.5 fL (ref 78.0–100.0)
MONO ABS: 0.4 10*3/uL (ref 0.1–1.0)
Monocytes Relative: 10 %
NEUTROS ABS: 2.4 10*3/uL (ref 1.7–7.7)
Neutrophils Relative %: 57 %
PLATELETS: 257 10*3/uL (ref 150–400)
RBC: 4.08 MIL/uL — AB (ref 4.22–5.81)
RDW: 14.4 % (ref 11.5–15.5)
WBC: 4.2 10*3/uL (ref 4.0–10.5)

## 2016-08-19 MED ORDER — POTASSIUM CHLORIDE CRYS ER 20 MEQ PO TBCR
40.0000 meq | EXTENDED_RELEASE_TABLET | Freq: Two times a day (BID) | ORAL | Status: DC
  Administered 2016-08-19 – 2016-08-20 (×2): 40 meq via ORAL
  Filled 2016-08-19 (×2): qty 2

## 2016-08-19 MED ORDER — MORPHINE SULFATE (PF) 4 MG/ML IV SOLN
4.0000 mg | Freq: Once | INTRAVENOUS | Status: AC
  Administered 2016-08-19: 4 mg via INTRAVENOUS
  Filled 2016-08-19: qty 1

## 2016-08-19 MED ORDER — SODIUM CHLORIDE 0.9 % IV BOLUS (SEPSIS)
500.0000 mL | Freq: Once | INTRAVENOUS | Status: AC
  Administered 2016-08-19: 500 mL via INTRAVENOUS

## 2016-08-19 MED ORDER — ACETAMINOPHEN 650 MG RE SUPP: 650.0000 mg | Freq: Four times a day (QID) | RECTAL | Status: DC | PRN

## 2016-08-19 MED ORDER — METHOCARBAMOL 500 MG PO TABS
500.0000 mg | ORAL_TABLET | Freq: Four times a day (QID) | ORAL | Status: DC | PRN
  Administered 2016-08-19 – 2016-08-20 (×2): 500 mg via ORAL
  Filled 2016-08-19 (×2): qty 1

## 2016-08-19 MED ORDER — MAGNESIUM CITRATE PO SOLN: 1.0000 | Freq: Once | ORAL | Status: DC | PRN

## 2016-08-19 MED ORDER — MORPHINE SULFATE ER 15 MG PO TBCR
45.0000 mg | EXTENDED_RELEASE_TABLET | Freq: Two times a day (BID) | ORAL | Status: DC
  Administered 2016-08-19 – 2016-08-20 (×2): 45 mg via ORAL
  Filled 2016-08-19 (×2): qty 3

## 2016-08-19 MED ORDER — ENOXAPARIN SODIUM 40 MG/0.4ML ~~LOC~~ SOLN
40.0000 mg | SUBCUTANEOUS | Status: DC
  Administered 2016-08-19: 40 mg via SUBCUTANEOUS
  Filled 2016-08-19: qty 0.4

## 2016-08-19 MED ORDER — DEXAMETHASONE SODIUM PHOSPHATE 10 MG/ML IJ SOLN
10.0000 mg | Freq: Once | INTRAMUSCULAR | Status: AC
  Administered 2016-08-19: 10 mg via INTRAVENOUS
  Filled 2016-08-19: qty 1

## 2016-08-19 MED ORDER — ACETAMINOPHEN 325 MG PO TABS: 650.0000 mg | ORAL_TABLET | Freq: Four times a day (QID) | ORAL | Status: DC | PRN

## 2016-08-19 MED ORDER — BISACODYL 10 MG RE SUPP: 10.0000 mg | Freq: Every day | RECTAL | Status: DC | PRN

## 2016-08-19 MED ORDER — ONDANSETRON HCL 4 MG/2ML IJ SOLN
4.0000 mg | Freq: Once | INTRAMUSCULAR | Status: AC
  Administered 2016-08-19: 4 mg via INTRAVENOUS
  Filled 2016-08-19: qty 2

## 2016-08-19 MED ORDER — PANTOPRAZOLE SODIUM 40 MG PO TBEC
40.0000 mg | DELAYED_RELEASE_TABLET | Freq: Every day | ORAL | Status: DC
  Administered 2016-08-19 – 2016-08-20 (×2): 40 mg via ORAL
  Filled 2016-08-19 (×2): qty 1

## 2016-08-19 MED ORDER — HYDRALAZINE HCL 20 MG/ML IJ SOLN: 5.0000 mg | Freq: Three times a day (TID) | INTRAMUSCULAR | Status: DC | PRN

## 2016-08-19 MED ORDER — ONDANSETRON HCL 4 MG PO TABS: 4.0000 mg | ORAL_TABLET | Freq: Four times a day (QID) | ORAL | Status: DC | PRN

## 2016-08-19 MED ORDER — LEVOTHYROXINE SODIUM 137 MCG PO TABS
137.0000 ug | ORAL_TABLET | Freq: Every day | ORAL | Status: DC
Start: 2016-08-19 — End: 2016-08-20
  Administered 2016-08-19 – 2016-08-20 (×2): 137 ug via ORAL
  Filled 2016-08-19 (×2): qty 1

## 2016-08-19 MED ORDER — HYDROMORPHONE HCL 1 MG/ML IJ SOLN: 0.5000 mg | INTRAMUSCULAR | Status: DC | PRN

## 2016-08-19 MED ORDER — MORPHINE SULFATE 15 MG PO TABS: 45.0000 mg | ORAL_TABLET | Freq: Two times a day (BID) | ORAL | Status: DC

## 2016-08-19 MED ORDER — ONDANSETRON HCL 4 MG/2ML IJ SOLN: 4.0000 mg | Freq: Four times a day (QID) | INTRAMUSCULAR | Status: DC | PRN

## 2016-08-19 MED ORDER — HYDROCODONE-ACETAMINOPHEN 10-325 MG PO TABS
1.0000 | ORAL_TABLET | Freq: Four times a day (QID) | ORAL | Status: DC | PRN
  Administered 2016-08-19: 1 via ORAL
  Filled 2016-08-19: qty 1

## 2016-08-19 MED ORDER — SENNOSIDES-DOCUSATE SODIUM 8.6-50 MG PO TABS: 1.0000 | ORAL_TABLET | Freq: Every evening | ORAL | Status: DC | PRN

## 2016-08-19 MED ORDER — TAMSULOSIN HCL 0.4 MG PO CAPS
0.4000 mg | ORAL_CAPSULE | Freq: Every day | ORAL | Status: DC
  Filled 2016-08-19: qty 1

## 2016-08-19 MED ORDER — MORPHINE SULFATE ER 30 MG PO TBCR: 45.0000 mg | EXTENDED_RELEASE_TABLET | Freq: Two times a day (BID) | ORAL | Status: DC

## 2016-08-19 NOTE — ED Notes (Signed)
MD at bedside. 

## 2016-08-19 NOTE — ED Triage Notes (Addendum)
Had back surgery in January to L2-3.  Continues to have pain despite taking medications.  Reports there was a fracture and a mass.  Had xrays done on Friday a week ago.  Per physician in [place] pt would need more surgery.  Pt is moving back to [place].  Also reports numbness and tingling down the left leg.

## 2016-08-19 NOTE — Progress Notes (Signed)
Pt admitted into 6N01 from ED, oriented to room and dept.

## 2016-08-19 NOTE — ED Notes (Signed)
Patient transported to X-ray 
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
## 2016-08-19 NOTE — ED Notes (Signed)
ED Provider at bedside. 

## 2016-08-19 NOTE — Progress Notes (Signed)
Texted Dr. Nehemiah Settle about K+ of 3.0 and diet order?
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
## 2016-08-19 NOTE — ED Notes (Signed)
Attempted report x1. 

## 2016-08-19 NOTE — H&P (Addendum)
error 
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
## 2016-08-20 DIAGNOSIS — M545 Low back pain: Secondary | ICD-10-CM

## 2016-08-20 LAB — CBC
HCT: 35.8 % — ABNORMAL LOW (ref 39.0–52.0)
HEMOGLOBIN: 11.7 g/dL — AB (ref 13.0–17.0)
MCH: 28 pg (ref 26.0–34.0)
MCHC: 32.7 g/dL (ref 30.0–36.0)
MCV: 85.6 fL (ref 78.0–100.0)
Platelets: 265 10*3/uL (ref 150–400)
RBC: 4.18 MIL/uL — ABNORMAL LOW (ref 4.22–5.81)
RDW: 14.5 % (ref 11.5–15.5)
WBC: 4 10*3/uL (ref 4.0–10.5)

## 2016-08-20 LAB — COMPREHENSIVE METABOLIC PANEL
ALT: 29 U/L (ref 17–63)
ANION GAP: 11 (ref 5–15)
AST: 30 U/L (ref 15–41)
Albumin: 3 g/dL — ABNORMAL LOW (ref 3.5–5.0)
Alkaline Phosphatase: 140 U/L — ABNORMAL HIGH (ref 38–126)
BILIRUBIN TOTAL: 0.6 mg/dL (ref 0.3–1.2)
BUN: 12 mg/dL (ref 6–20)
CO2: 22 mmol/L (ref 22–32)
Calcium: 9.1 mg/dL (ref 8.9–10.3)
Chloride: 105 mmol/L (ref 101–111)
Creatinine, Ser: 0.79 mg/dL (ref 0.61–1.24)
GFR calc Af Amer: >60 mL/min (ref >60)
Glucose, Bld: 153 mg/dL — ABNORMAL HIGH (ref 65–99)
POTASSIUM: 4.4 mmol/L (ref 3.5–5.1)
Sodium: 138 mmol/L (ref 135–145)
TOTAL PROTEIN: 6.6 g/dL (ref 6.5–8.1)

## 2016-08-20 MED ORDER — TAMSULOSIN HCL 0.4 MG PO CAPS: 0.4000 mg | ORAL_CAPSULE | Freq: Every day | ORAL | 0 refills | Status: DC

## 2016-08-20 MED ORDER — MORPHINE SULFATE 15 MG PO TABS: 45.0000 mg | ORAL_TABLET | Freq: Two times a day (BID) | ORAL | 0 refills | Status: DC

## 2016-08-20 MED ORDER — LEVOTHYROXINE SODIUM 137 MCG PO TABS: 137.0000 ug | ORAL_TABLET | Freq: Every day | ORAL | 0 refills | Status: DC

## 2016-08-20 MED ORDER — POLYETHYLENE GLYCOL 3350 17 G PO PACK: 17.0000 g | PACK | Freq: Every day | ORAL | 0 refills | Status: DC

## 2016-08-20 MED ORDER — SENNOSIDES-DOCUSATE SODIUM 8.6-50 MG PO TABS: 1.0000 | ORAL_TABLET | Freq: Two times a day (BID) | ORAL | 0 refills | Status: DC

## 2016-08-20 MED ORDER — METHOCARBAMOL 500 MG PO TABS: 500.0000 mg | ORAL_TABLET | Freq: Four times a day (QID) | ORAL | 0 refills | Status: DC | PRN

## 2016-08-20 MED ORDER — HYDROCODONE-ACETAMINOPHEN 10-325 MG PO TABS: 1.0000 | ORAL_TABLET | Freq: Four times a day (QID) | ORAL | 0 refills | Status: DC | PRN

## 2016-08-20 MED ORDER — ESOMEPRAZOLE MAGNESIUM 40 MG PO CPDR: 40.0000 mg | DELAYED_RELEASE_CAPSULE | Freq: Every day | ORAL | 0 refills | Status: DC | PRN

## 2016-08-20 NOTE — Progress Notes (Signed)
Called for Lumbar corsett and spoke with ortho tech, she will call the [employer] personnel and let them know he is up for discharge once lumbar corsett has been obtained.

## 2016-08-20 NOTE — Discharge Summary (Signed)
[facility]  Physician Discharge Summary   Patient ID: Edgar Ross MRN: [id number] DOB/AGE: Jan 27, 1951 66 y.o.  Admit date: 08/19/2016 Discharge date: 08/20/2016  PCP: ELMAHDY,WAGDY, MD  DISCHARGE DIAGNOSES:  Active Problems:   Lung cancer, Left upper lobe   Colon cancer (HCC)   Back pain   Hypertension   Hypothyroidism   RECOMMENDATIONS FOR OUTPATIENT FOLLOW UP: 1. Patient to follow-up with neurosurgery and oncology. Please see below   DISCHARGE CONDITION: fair  Diet recommendation: Regular  Filed Weights   08/19/16 0409 08/20/16 0500  Weight: 68 kg (150 lb) 66.4 kg (146 lb 6.2 oz)    INITIAL HISTORY: 66 year old male with a history of lung cancer in 2014 status post left upper lobe wedge resection, GERD, thyroid disease. Patient had pathological compression fracture of L3 secondary to metastatic disease, found to be adenocarcinoma most consistent with colon cancer. The patient underwent surgery for decompression with resection of epidural mass, L3 corpectomy, and fusion on 05/28/16. He did have a spacer put in the place of the L3 corpus. The patient had continued pain and was managed with long-acting morphine and hydrocodone. Patient presented with worsening pain. He ran out of his pain medications about a week prior to hospitalization.  Consultations:  Oncology: Dr. Jana Hakim  Neurosurgery: Dr. Casper Harrison COURSE:   Severe cancer related back pain in a patient with diagnosis of  metastatic adenocarcinoma of colon primary Patient was seen in the emergency department. He underwent CT scan of his lumbar spine which revealed multiple bone lesions consistent with metastatic process. Loosening of hardware was also seen. Tumor involvement of the L3 vertebra extending into the ventral epidural space, greater on the right was also seen. This did not appear to cause significant central stenosis, but did encroach upon the neural foramina. Most recent PET on 06/19/16  remarkable for hypermetabolic osseous Mets especially in T4, T11, as well as in the left posterior iliac bone  And right anterior iliac bone, and right lobe hepatic mets. Colonoscopy on July 07 2016 in [place] showed 3 small polyps in rectum, removed, path not in chart. Patient has a previous history of Lung Ca s/p wedge resection for which no chemo or radiation was required. Patient was hospitalized. Patient was seen by neurosurgery. Patient did not have any neurological weakness. Any surgery to attempt to fix his abnormalities will be quite extensive and major. No urgent indication for surgery at this time. Patient to follow-up with neurosurgery in their office. Patient also seen by oncology. They will arrange outpatient follow-up for the patient in the cancer center. Patient's pain is now well controlled. He will be discharged on oral pain medications for his cancer associated pain.   Hypothyroidism Continue home Synthroid.  Elevated blood pressure Likely due to pain. He was not on OP BP meds on arrival. Blood pressure improved with pain control  Discussed in detail with patient this morning. Lumbar corset was arranged for him. He was ambulated without any difficulty. Patient does not have any fecal or urine incontinence. Pain has improved. Patient agrees with the above plan. Okay for discharge home.   PERTINENT LABS:  The results of significant diagnostics from this hospitalization (including imaging, microbiology, ancillary and laboratory) are listed below for reference.      Labs: Basic Metabolic Panel:  Recent Labs Lab 08/19/16 0827 08/20/16 0550  NA 140 138  K 3.0* 4.4  CL 106 105  CO2 24 22  GLUCOSE 89 153*  BUN 10  12  CREATININE 0.69 0.79  CALCIUM 8.9 9.1   Liver Function Tests:  Recent Labs Lab 08/19/16 0827 08/20/16 0550  AST 35 30  ALT 32 29  ALKPHOS 151* 140*  BILITOT 0.5 0.6  PROT 6.4* 6.6  ALBUMIN 3.2* 3.0*   CBC:  Recent Labs Lab  08/19/16 0827 08/20/16 0550  WBC 4.2 4.0  NEUTROABS 2.4  --   HGB 11.3* 11.7*  HCT 34.9* 35.8*  MCV 85.5 85.6  PLT 257 265     IMAGING STUDIES Dg Lumbar Spine Complete  Result Date: 08/19/2016 CLINICAL DATA:  Low back pain. Lumbar fusion earlier this year. History of lung cancer. EXAM: LUMBAR SPINE - COMPLETE 4+ VIEW COMPARISON:  Lumbar spine radiographs 09/08/2009. FINDINGS: There are 5 non rib bearing lumbar type vertebrae. There is slight right convex curvature at the thoracolumbar junction. There is straightening of the normal lumbar lordosis. Sequelae of L2-S1 fusion are identified. Bilateral pedicle screws and interconnecting rods are in place at L2, L4, L5, and S1 and appear intact. There is lucency about the left and likely right L2 screws, and the left screw may breach the L2 superior endplate. An L3 corpectomy has been performed, and the implanted cage at this level appears mildly to moderately angulated from posterosuperior to anteroinferior. There is mild anterior displacement of a fractured component of the L3 vertebral body. There is posterior L3 vertebral body height loss and mildly heterogeneous osseous density likely reflecting pathologic fracture given patient's history. Mild anterior wedging of the L4 and L5 vertebrae with interbody osseous fusion is unchanged. IMPRESSION: 1. Interval L2-S1 posterior fusion. Lucency about the L2 screws concerning for loosening. The left screw may breach the superior endplate. 2. Presumably pathologic L3 fracture status post corpectomy with mild-to-moderate angulation of the cage. Electronically Signed   By: Logan Bores M.D.   On: 08/19/2016 08:55   Ct Lumbar Spine Wo Contrast  Result Date: 08/19/2016 CLINICAL DATA:  Had back surgery in January to L2-3. Continues to have pain despite taking medications. Reports there was a fracture and a mass. History of lung carcinoma. EXAM: CT LUMBAR SPINE WITHOUT CONTRAST TECHNIQUE: Multidetector CT imaging  of the lumbar spine was performed without intravenous contrast administration. Multiplanar CT image reconstructions were also generated. COMPARISON:  Current lumbar spine radiographs FINDINGS: Segmentation: 5 lumbar type vertebrae. Alignment: Straightened lumbar lordosis.  No spondylolisthesis. Vertebrae: There has been a spinal fusion extending from L2 through S1. Bilateral pedicle screws are noted at L2, L4, L5 and S1 with posterior any connecting rods. And interbody vertebral spacer extends from the central lower endplate of L2 to the anterior upper endplate of L4. The L3 vertebrae shows significant destruction. The anterior cortical margin is mostly preserved. Most of the posterior cortical margin has was orbit. The lytic changes extend to the right posterior elements. There is a lytic lesion within the T11 vertebrae along its central posterior aspect with resorption of a portion of the posterior cortex. This extends into the pedicles, mostly on the right. There are 2 small lytic lesions in the T12 vertebral body. There is a lytic lesion of the spinous process and adjacent lamina of L1. a There is a lytic lesion along the posterior left ilium. There is bony fusion at the L4-L5 level across the disc space. There is loss of vertebral height, mild, of L5. Vertebroplasty type cement is noted within the L4-L5 vertebra. There is lucency adjacent to the pedicle screws at L2, and adjacent to the right pedicle screw  at L4. This suggests loosening. Hemi laminectomies have been performed from L2 through L5. Paraspinal and other soft tissues: Soft tissue consistent with postoperative scarring is noted along the laminectomy defects from L2 through L5. There is some soft tissue associated with the lytic lesion of the L3 vertebra that extends into the ventral epidural space, greater on the right, not well-defined on this exam. Soft tissue from the lytic lesion at T12 bulges against the ventral epidural space, greater on the  right without significant central stenosis. Disc levels: T11-T12:  No convincing disc herniation.  No significant stenosis. T12-L1:  No disc herniation.  No stenosis. L1-L2: No convincing disc herniation or significant disc bulging. Mild facet productive changes cause mild neural foraminal narrowing on the right. L2-L3: Spinal canal contents are not well-defined due to artifact from the fusion hardware. The neural foramina appear well preserved. L3-L4: Abnormal soft tissue extends into the peripheral aspect of the right neural foramen. There is mild left neural foraminal narrowing. L4-L5: Spinal canal contents not well evaluated due to orthopedic hardware artifact. No convincing central stenosis. Neural foramina are well preserved. L5-S1: No disc herniation. Mild narrowing of the right neural foramen from facet productive change and mild spondylotic disc bulging. IMPRESSION: 1. No acute abnormalities. 2. Findings detailed above are consistent with metastatic disease presumably from lung carcinoma, with multiple bone lesions noted. This most severely involves the L3 vertebra, which has been treated surgically with an interbody metal spacer extending from L2 to the top of L4 crossing the involved L3 vertebra. This is supported by posterior orthopedic hardware extending from L2 through S1. 3. The pedicle screws at L2 and on the right at L4 show adjacent bony lucency suggesting loosening. 4. Tumor involvement of the L3 vertebra extends into the ventral epidural space, greater on the right. This does not appear to cause significant central stenosis, but does encroach upon the neural foramina. Electronically Signed   By: Lajean Manes M.D.   On: 08/19/2016 12:07    DISCHARGE EXAMINATION: Vitals:   08/19/16 1720 08/19/16 2005 08/20/16 0412 08/20/16 0500  BP: 123/72 (!) 145/84 135/72   Pulse: 72 60 63   Resp: '18 18 17   '$ Temp: 98.4 F (36.9 C) 98.3 F (36.8 C) 98.4 F (36.9 C)   TempSrc: Oral Oral Oral   SpO2:  100% 100% 96%   Weight:    66.4 kg (146 lb 6.2 oz)  Height:       General appearance: alert, cooperative, appears stated age and no distress Resp: clear to auscultation bilaterally Cardio: regular rate and rhythm, S1, S2 normal, no murmur, click, rub or gallop GI: soft, non-tender; bowel sounds normal; no masses,  no organomegaly Extremities: extremities normal, atraumatic, no cyanosis or edema Neurologic: Alert and oriented X 3, normal strength and tone.   DISPOSITION: Home  Discharge Instructions    Call MD for:  difficulty breathing, headache or visual disturbances    Complete by:  As directed    Call MD for:  extreme fatigue    Complete by:  As directed    Call MD for:  persistant dizziness or light-headedness    Complete by:  As directed    Call MD for:  persistant nausea and vomiting    Complete by:  As directed    Call MD for:  severe uncontrolled pain    Complete by:  As directed    Diet general    Complete by:  As directed    Discharge instructions  Complete by:  As directed    Please take your pain medications as prescribed. Be sure to follow-up with Dr. Christella Noa and with the cancer doctor. Establish with primary care provider. Seek attention immediately if you notice worsening symptoms or weakness in your legs, or stool or urine incontinence.  You were cared for by a hospitalist during your hospital stay. If you have any questions about your discharge medications or the care you received while you were in the hospital after you are discharged, you can call the unit and asked to speak with the hospitalist on call if the hospitalist that took care of you is not available. Once you are discharged, your primary care physician will handle any further medical issues. Please note that NO REFILLS for any discharge medications will be authorized once you are discharged, as it is imperative that you return to your primary care physician (or establish a relationship with a primary care  physician if you do not have one) for your aftercare needs so that they can reassess your need for medications and monitor your lab values. If you do not have a primary care physician, you can call [phone] for a physician referral.   Increase activity slowly    Complete by:  As directed       ALLERGIES:  Allergies  Allergen Reactions  . Suboxone [Buprenorphine Hcl-Naloxone Hcl] Nausea And Vomiting  . Morphine And Related Nausea And Vomiting  . Vancomycin Hives    Only where applied.      Discharge Medication List as of 08/20/2016  1:28 PM    START taking these medications   Details  polyethylene glycol (MIRALAX) packet Take 17 g by mouth daily., Starting Sun 08/20/2016, Print    senna-docusate (SENOKOT-S) 8.6-50 MG tablet Take 1 tablet by mouth 2 (two) times daily., Starting Sun 08/20/2016, Print      CONTINUE these medications which have CHANGED   Details  esomeprazole (NEXIUM) 40 MG capsule Take 1 capsule (40 mg total) by mouth daily as needed (heartburn)., Starting Sun 08/20/2016, Print    HYDROcodone-acetaminophen (NORCO) 10-325 MG tablet Take 1 tablet by mouth every 6 (six) hours as needed for moderate pain., Starting Sun 08/20/2016, Print    levothyroxine (SYNTHROID, LEVOTHROID) 137 MCG tablet Take 1 tablet (137 mcg total) by mouth daily before breakfast., Starting Sun 08/20/2016, Print    methocarbamol (ROBAXIN) 500 MG tablet Take 1 tablet (500 mg total) by mouth every 6 (six) hours as needed for muscle spasms., Starting Sun 08/20/2016, Print    morphine (MSIR) 15 MG tablet Take 3 tablets (45 mg total) by mouth every 12 (twelve) hours., Starting Sun 08/20/2016, Print    tamsulosin (FLOMAX) 0.4 MG CAPS capsule Take 1 capsule (0.4 mg total) by mouth daily after supper., Starting Sun 08/20/2016, Print      CONTINUE these medications which have NOT CHANGED   Details  GABAPENTIN PO Take 1 capsule by mouth 3 (three) times daily., Historical Med      STOP taking these  medications     calcium carbonate (OS-CAL - DOSED IN MG OF ELEMENTAL CALCIUM) 1250 MG tablet          Follow-up Information    CABBELL,KYLE L, MD Follow up.   Specialty:  Neurosurgery Why:  Please call his office on Monday to schedule appointment within a week. Tell them that you were seen by Dr. Christella Noa in the hospital. Contact information: [address] [place] [address] [phone]  Chauncey Cruel, MD Follow up.   Specialty:  Oncology Why:  He will arrange outpatient follow-up at the cancer center. Contact information: [address] [place] [address] [phone]           TOTAL DISCHARGE TIME: 35 mins  [facility] Pager [phone]  08/20/2016, 2:19 PM

## 2016-08-21 ENCOUNTER — Telehealth: Payer: Self-pay | Admitting: Oncology

## 2016-08-21 ENCOUNTER — Other Ambulatory Visit: Payer: Self-pay | Admitting: Oncology

## 2016-08-21 LAB — CEA: CEA: 49.3 ng/mL — AB (ref 0.0–4.7)

## 2016-08-21 LAB — CANCER ANTIGEN 19-9: CA 19-9: 1 U/mL (ref 0–35)

## 2016-08-21 NOTE — Telephone Encounter (Signed)
Pt cld to schedule a hosp f/u w/Dr. Jana Hakim. Appt has been scheduled for the pt for 4/24 at 130pm. Pt aware to arrive 30 minutes early. Demographics verified.

## 2016-08-22 ENCOUNTER — Telehealth: Payer: Self-pay | Admitting: Oncology

## 2016-08-22 ENCOUNTER — Ambulatory Visit (HOSPITAL_BASED_OUTPATIENT_CLINIC_OR_DEPARTMENT_OTHER): Payer: Medicaid Other | Admitting: Oncology

## 2016-08-22 VITALS — BP 127/75 | HR 78 | Temp 97.8°F | Resp 20 | Ht 75.0 in | Wt 148.8 lb

## 2016-08-22 DIAGNOSIS — C189 Malignant neoplasm of colon, unspecified: Secondary | ICD-10-CM

## 2016-08-22 DIAGNOSIS — C7951 Secondary malignant neoplasm of bone: Secondary | ICD-10-CM | POA: Diagnosis not present

## 2016-08-22 DIAGNOSIS — C3412 Malignant neoplasm of upper lobe, left bronchus or lung: Secondary | ICD-10-CM

## 2016-08-22 DIAGNOSIS — Z85118 Personal history of other malignant neoplasm of bronchus and lung: Secondary | ICD-10-CM | POA: Diagnosis not present

## 2016-08-22 NOTE — Telephone Encounter (Signed)
Per LOS unable to sch new pt evaluation with Dr Burr Medico or Benay Spice. Contacted new pt scheduler and informed her of request. She will msg MD's to see when next available. Pt will be contacted with next appts
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
## 2016-08-24 ENCOUNTER — Ambulatory Visit (HOSPITAL_BASED_OUTPATIENT_CLINIC_OR_DEPARTMENT_OTHER): Payer: Medicaid Other | Admitting: Oncology

## 2016-08-24 ENCOUNTER — Telehealth: Payer: Self-pay | Admitting: Oncology

## 2016-08-24 VITALS — BP 126/79 | HR 82 | Temp 98.0°F | Resp 18 | Ht 74.0 in | Wt 146.2 lb

## 2016-08-24 DIAGNOSIS — C7951 Secondary malignant neoplasm of bone: Secondary | ICD-10-CM

## 2016-08-24 DIAGNOSIS — C189 Malignant neoplasm of colon, unspecified: Secondary | ICD-10-CM

## 2016-08-24 DIAGNOSIS — C787 Secondary malignant neoplasm of liver and intrahepatic bile duct: Secondary | ICD-10-CM | POA: Diagnosis not present

## 2016-08-24 DIAGNOSIS — Z85118 Personal history of other malignant neoplasm of bronchus and lung: Secondary | ICD-10-CM | POA: Diagnosis not present

## 2016-08-24 MED ORDER — HYDROCODONE-ACETAMINOPHEN 10-325 MG PO TABS: 1.0000 | ORAL_TABLET | Freq: Four times a day (QID) | ORAL | 0 refills | Status: DC | PRN

## 2016-08-24 MED ORDER — MORPHINE SULFATE ER 30 MG PO TBCR: 30.0000 mg | EXTENDED_RELEASE_TABLET | Freq: Two times a day (BID) | ORAL | 0 refills | Status: DC

## 2016-08-24 MED ORDER — MORPHINE SULFATE 15 MG PO TABS: 15.0000 mg | ORAL_TABLET | ORAL | 0 refills | Status: DC | PRN

## 2016-08-24 NOTE — Telephone Encounter (Signed)
Gave patient AVS and calender per 4/26 los. [facility] to contact patient to schedule Ct.
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
## 2016-08-24 NOTE — Patient Instructions (Signed)
You were given a prescription for MS Contin 30 mg (long acting) pain medication. Take this every 12 hours. Use Morphine Sulfate IR every 4 hours as needed for severe pain (or pain not relieved by Hydrocodone).

## 2016-08-25 ENCOUNTER — Telehealth: Payer: Self-pay | Admitting: *Deleted

## 2016-08-25 NOTE — Telephone Encounter (Signed)
Message from pt: Dr. Truett Mainland (Gastroenterologist) fax [phone]. Pt has scans on CD.  Returned call, informed him that we received records from Dr. Ephriam Jenkins office. CD can be dropped off at front desk, we will upload and return original copy. Pt voiced understanding. He has not filled MS Contin script yet, he is waiting for Medicaid to become active 08/29/16.

## 2016-08-31 ENCOUNTER — Ambulatory Visit
Admission: RE | Admit: 2016-08-31 | Discharge: 2016-08-31 | Disposition: A | Discharge status: Home / Self Care | Payer: Medicaid Other | Source: Ambulatory Visit | Attending: Oncology | Admitting: Oncology

## 2016-08-31 ENCOUNTER — Other Ambulatory Visit: Payer: Self-pay | Admitting: Oncology

## 2016-08-31 DIAGNOSIS — C801 Malignant (primary) neoplasm, unspecified: Secondary | ICD-10-CM

## 2016-09-05 ENCOUNTER — Ambulatory Visit (HOSPITAL_COMMUNITY)
Admission: RE | Admit: 2016-09-05 | Discharge: 2016-09-05 | Disposition: A | Discharge status: Home / Self Care | Payer: Medicaid Other | Source: Ambulatory Visit | Attending: Oncology | Admitting: Oncology

## 2016-09-05 ENCOUNTER — Encounter (HOSPITAL_COMMUNITY): Payer: Self-pay

## 2016-09-05 DIAGNOSIS — C7951 Secondary malignant neoplasm of bone: Secondary | ICD-10-CM | POA: Insufficient documentation

## 2016-09-05 DIAGNOSIS — C787 Secondary malignant neoplasm of liver and intrahepatic bile duct: Secondary | ICD-10-CM | POA: Diagnosis not present

## 2016-09-05 DIAGNOSIS — C189 Malignant neoplasm of colon, unspecified: Secondary | ICD-10-CM | POA: Insufficient documentation

## 2016-09-05 MED ORDER — IOPAMIDOL (ISOVUE-300) INJECTION 61%
100.0000 mL | Freq: Once | INTRAVENOUS | Status: AC | PRN
  Administered 2016-09-05: 100 mL via INTRAVENOUS

## 2016-09-05 MED ORDER — IOPAMIDOL (ISOVUE-300) INJECTION 61%
INTRAVENOUS | Status: AC
  Filled 2016-09-05: qty 100

## 2016-09-08 ENCOUNTER — Encounter: Payer: Self-pay | Admitting: *Deleted

## 2016-09-08 ENCOUNTER — Telehealth: Payer: Self-pay | Admitting: *Deleted

## 2016-09-08 ENCOUNTER — Telehealth: Payer: Self-pay | Admitting: Nurse Practitioner

## 2016-09-08 ENCOUNTER — Ambulatory Visit (HOSPITAL_BASED_OUTPATIENT_CLINIC_OR_DEPARTMENT_OTHER): Payer: Medicaid Other | Admitting: Nurse Practitioner

## 2016-09-08 VITALS — BP 124/90 | HR 94 | Temp 97.8°F | Resp 18 | Ht 74.0 in | Wt 142.1 lb

## 2016-09-08 DIAGNOSIS — G893 Neoplasm related pain (acute) (chronic): Secondary | ICD-10-CM

## 2016-09-08 DIAGNOSIS — C787 Secondary malignant neoplasm of liver and intrahepatic bile duct: Secondary | ICD-10-CM

## 2016-09-08 DIAGNOSIS — C7951 Secondary malignant neoplasm of bone: Secondary | ICD-10-CM | POA: Diagnosis not present

## 2016-09-08 DIAGNOSIS — C189 Malignant neoplasm of colon, unspecified: Secondary | ICD-10-CM

## 2016-09-08 DIAGNOSIS — Z85118 Personal history of other malignant neoplasm of bronchus and lung: Secondary | ICD-10-CM

## 2016-09-08 MED ORDER — HYDROMORPHONE HCL 4 MG PO TABS: 4.0000 mg | ORAL_TABLET | ORAL | 0 refills | Status: DC | PRN

## 2016-09-08 MED ORDER — DEXAMETHASONE 4 MG PO TABS: ORAL_TABLET | ORAL | 0 refills | Status: DC

## 2016-09-08 NOTE — Telephone Encounter (Signed)
Call received from patient.  "I was given prescription for Hydromorphone.  [facility] will not allow my brother to have this filled.  I don't know why but make Ned Card aware of  This situation."  Provider notified.    [facility] pharmacist who reports "he received Morphine 30 mg (MS Contin) from another provider, Betsy Coder on May 4th for a thirty day supply."  Verbal order received and read back from [employer] authorizing the long acting MS Contin and the use of short acting Hydromorphone for break through pain.    Order given to Pharmacist to proceed with this medication order at this time.  What he has on hand is long acting.  Today's order is short acting.  Also shared Dr. Benay Spice is a provider here who Marcello Moores works with.    Mr. Conal notified order discrepancy clarified, he may have this filled today.  "Thanks.  What I had was working until I started having spasms on Tuesday.  I'll call my brother to go back to the Pharmacy."

## 2016-09-08 NOTE — Progress Notes (Signed)
  Oncology Nurse Navigator Documentation  Navigator Location: [facility] (09/08/16 1400)   )Navigator Encounter Type: Clinic/[facility] (09/08/16 1400)  Patient at [facility] for f/u appointment with Dr. Benay Spice.  Patient experiencing a lot of pain and spasms.  He reports that his pain medication had been keeping his pain controlled but after laying on the exam table when he had a CT on Tuesday, he has experienced increased pain and spasms in his back.  Patient unable to sit still due to the pain.  Patient also upset because he reports that he did not know the colon polyps he had removed were malignant.  I listened to patient and reinforced instructions and information about his prescriptions.  Patient to return to see Dr. Benay Spice on Monday.  I told him I would check on him Monday.  Patient denied any questions or other needs at this time.                   Patient Visit Type: MedOnc (09/08/16 1400) Treatment Phase: Abnormal Scans (09/08/16 1400) Barriers/Navigation Needs: Coordination of Care (09/08/16 1400)   Interventions: Education;Medication Assistance;Psycho-social support (09/08/16 1400)            Acuity: Level 2 (09/08/16 1400)         Time Spent with Patient: 30 (09/08/16 1400)

## 2016-09-08 NOTE — Progress Notes (Addendum)
[facility] OFFICE PROGRESS NOTE   Diagnosis:  Metastatic colon cancer  INTERVAL HISTORY:   Edgar Ross returns as scheduled. He reports onset of severe back pain during the recent CT scans. He currently takes MS Contin and hydrocodone. He notes some improvement following the hydrocodone. He thinks he is having "spasms". No leg weakness. He has stable numbness at the upper thighs. He is having constipation. No incontinence.  Objective:  Vital signs in last 24 hours:  Blood pressure 124/90, pulse 94, temperature 97.8 F (36.6 C), temperature source Oral, resp. rate 18, height 6' 2"  (1.88 m), weight 142 lb 1.6 oz (64.5 kg), SpO2 100 %.   He was unable to lie on the examination table. Vascular: No leg edema. Neuro: Alert and oriented. Leg strength 5 over 5.    Lab Results:  Lab Results  Component Value Date   WBC 4.0 08/20/2016   HGB 11.7 (L) 08/20/2016   HCT 35.8 (L) 08/20/2016   MCV 85.6 08/20/2016   PLT 265 08/20/2016   NEUTROABS 2.4 08/19/2016    Imaging:  No results found.  Medications: I have reviewed the patient's current medications.  Assessment/Plan: 1. Colon cancer, status post a polypectomy in 2017 revealing a focus of adenocarcinoma ? Back pain with tumor involving L3, status post an L3 corpectomy 05/28/2016 confirming metastatic adenocarcinoma consistent with a colon primary (cytokeratin 20 and CDX2 positive), comparison of the pathology to the 2017 polypectomy showed similar morphology; K-ras mutation detected; no evidence of DNA mismatch repair deficiency; intact tumor nuclear staining for MLH1, MSH2, MSH6 and PMS2 ? PET scan 06/19/2016 revealed multiple hypermetabolic bone metastases, hypermetabolic liver metastases, indeterminate mildly hypermetabolic axillary lymph nodes ? Status post palliative radiation to the lumbar spine ? CTs 09/05/2016-diffuse liver metastases. Lytic bone metastases in the thoracic/lumbar spine and left  ilium.  2. Non-small cell lung cancer, adenocarcinoma, T2 N0, ALK positive, status post a left upper lobectomy August 2014  3.   Pain secondary to #1  4.   History of a thyroidectomy  5.   Colonoscopy 07/07/2016-benign polypoid lesion the sigmoid colon-biopsied, diminutive polyps in the rectum, tattoo in the sigmoid colon-site appeared normal   Disposition: We reviewed the CT results from 09/05/2016 with Edgar Ross and his mother. They understand he appears to have metastatic disease involving the liver and multiple bones. They further understand that no therapy will be curative. Dr. Benay Spice recommends systemic therapy on the FOLFOX regimen. We began preliminary discussion regarding the chemotherapy.  Edgar Ross was in a significant amount of pain at today's visit. The pain appears to be secondary to metastatic disease involving bone. He will begin a trial of dexamethasone 8 mg twice daily for 2 days and then begin 4 mg twice daily. He will continue MS Contin 30 mg every 12 hours. He was given a prescription for hydromorphone 4 mg every 4 hours as needed. We discussed a referral to radiation oncology for palliative radiation if his pain does not improve with the above changes.  He was not ready to make a decision regarding initiating chemotherapy. He will return for a follow-up visit on 09/11/2016 for further discussion. He will contact the office in the interim with any problems.  Patient seen with Dr. Benay Spice. 25 minutes were spent face-to-face at today's visit with the majority of that time involved in counseling/coordination of care.    Ned Card ANP/GNP-BC   09/08/2016  1:57 PM This was a scheduled visit with Ned Card. We reviewed the  CT images with Edgar Ross and his mother. He has metastatic colon cancer involving the liver and multiple bone metastases. No therapy will be curative. We discussed systemic treatment options.  Edgar Ross has severe pain at the low back and left  pelvis/left leg today. The neurologic exam appears intact. It is unclear whether the pain is related to disease at the thoracolumbar spine or pelvis. We prescribed Decadron and Dilaudid. He will return for an office visit and further discussion on 09/11/2016.   Julieanne Manson, M.D.

## 2016-09-08 NOTE — Telephone Encounter (Signed)
Appointments scheduled per 5.11.18 LOS. Patient given AVS report and calendars with future scheduled appointments. °

## 2016-09-08 NOTE — Telephone Encounter (Signed)
Spoke with Marcie Bal re: request for pathology slides to be sent to office. I was transferred to Dr. Dorothea Ogle, the pathologist handling the case, she reports she obtained the slides from pt's lung cancer and is comparing the case in [place]. Requested she send slides and reports to office.

## 2016-09-11 ENCOUNTER — Other Ambulatory Visit: Payer: Medicaid Other

## 2016-09-11 ENCOUNTER — Other Ambulatory Visit: Payer: Self-pay | Admitting: *Deleted

## 2016-09-11 ENCOUNTER — Ambulatory Visit (HOSPITAL_BASED_OUTPATIENT_CLINIC_OR_DEPARTMENT_OTHER): Payer: Medicaid Other | Admitting: Oncology

## 2016-09-11 VITALS — BP 136/85 | HR 66 | Temp 98.1°F | Resp 18 | Ht 74.0 in | Wt 143.3 lb

## 2016-09-11 DIAGNOSIS — G893 Neoplasm related pain (acute) (chronic): Secondary | ICD-10-CM

## 2016-09-11 DIAGNOSIS — Z85118 Personal history of other malignant neoplasm of bronchus and lung: Secondary | ICD-10-CM

## 2016-09-11 DIAGNOSIS — C787 Secondary malignant neoplasm of liver and intrahepatic bile duct: Secondary | ICD-10-CM | POA: Diagnosis not present

## 2016-09-11 DIAGNOSIS — C189 Malignant neoplasm of colon, unspecified: Secondary | ICD-10-CM

## 2016-09-11 DIAGNOSIS — C7951 Secondary malignant neoplasm of bone: Secondary | ICD-10-CM

## 2016-09-11 MED ORDER — HYDROMORPHONE HCL 4 MG PO TABS: 4.0000 mg | ORAL_TABLET | ORAL | 0 refills | Status: DC | PRN

## 2016-09-11 NOTE — Addendum Note (Signed)
Addended by: [place] on: 09/11/2016 02:20 PM   Modules accepted: Orders

## 2016-09-11 NOTE — Progress Notes (Signed)
[facility] OFFICE PROGRESS NOTE   Diagnosis: Colon cancer  INTERVAL HISTORY:   Edgar Ross returns for scheduled visit. We saw him on 09/08/2016. He was started on Decadron and Dilaudid for pain. He reports significant improvement in the pain on 09/09/2016 and 09/10/2016. The pain has returned today. He has pain at the right and left lower back. The left leg pain has improved. No new complaint.  Objective:  Vital signs in last 24 hours:  Blood pressure 136/85, pulse 66, temperature 98.1 F (36.7 C), temperature source Oral, resp. rate 18, height _0  (1.88 m), weight 143 lb 4.8 oz (65 kg), SpO2 100 %.    HEENT: No thrush Resp: Lungs clear bilaterally Cardio: Regular rate and rhythm GI: No hepatosplenomegaly Vascular: No leg edema Neuro: Ambulates without difficulty      Lab Results:  Lab Results  Component Value Date   WBC 4.0 08/20/2016   HGB 11.7 (L) 08/20/2016   HCT 35.8 (L) 08/20/2016   MCV 85.6 08/20/2016   PLT 265 08/20/2016   NEUTROABS 2.4 08/19/2016    CMP     Component Value Date/Time   NA 138 08/20/2016 0550   K 4.4 08/20/2016 0550   CL 105 08/20/2016 0550   CO2 22 08/20/2016 0550   GLUCOSE 153 (H) 08/20/2016 0550   BUN 12 08/20/2016 0550   CREATININE 0.79 08/20/2016 0550   CALCIUM 9.1 08/20/2016 0550   PROT 6.6 08/20/2016 0550   ALBUMIN 3.0 (L) 08/20/2016 0550   AST 30 08/20/2016 0550   ALT 29 08/20/2016 0550   ALKPHOS 140 (H) 08/20/2016 0550   BILITOT 0.6 08/20/2016 0550   GFRNONAA >60 08/20/2016 0550   GFRAA >60 08/20/2016 0550     Medications: I have reviewed the patient's current medications.  Assessment/Plan: 1. Colon cancer, status post a polypectomy in 2017 revealing a focus of adenocarcinoma ? Back pain with tumor involving L3, status post an L3 corpectomy 05/28/2016 confirming metastatic adenocarcinoma consistent with a colon primary (cytokeratin 20 and CDX2positive), comparison of the pathology to the 2017  polypectomy showed similar morphology; K-ras mutation detected; no evidence of DNA mismatch repair deficiency; intact tumor nuclear staining for MLH1, MSH2, MSH6 and PMS2 ? PET scan 06/19/2016 revealed multiple hypermetabolic bone metastases, hypermetabolic liver metastases, indeterminate mildlyhypermetabolic axillary lymph nodes ? Status post palliative radiation to the lumbar spine ? CTs 09/05/2016-diffuse liver metastases. Lytic bone metastases in the thoracic/lumbar spine and left ilium.  2. Non-small cell lung cancer, adenocarcinoma, T2 N0, ALK positive, status post a left upper lobectomy August 2014  3. Pain secondary to #1  4. History of a thyroidectomy  5. Colonoscopy 07/07/2016-benign polypoid lesion the sigmoid colon-biopsied, diminutive polyps in the rectum, tattoo in the sigmoid colon-site appeared normal    Disposition:  Mr. Sakata has been diagnosed with metastatic colon cancer. He has pain secondary to bone metastases involving the spine and pelvis. The pain appears partially improved with Decadron . He will continue Decadron twice daily, MS Contin, and as needed Dilaudid.  I discussed the prognosis and treatment options with Mr. Barocio again today. I recommend systemic chemotherapy. He will not agreed to chemotherapy, Port-A-Cath placement, or a chemotherapy class at present. He would like to consider options further. I explained no therapy will be curative. If he does not begin systemic therapy I will recommend hospice care. We can also consider palliative radiation.  Mr. Manke will contact us for neurologic symptoms. He will return for an office visit and further  discussion on 09/19/2016.  30 minutes were spent with the patient today. The majority of the time was used for counseling and coordination of care.  Betsy Coder, MD  09/11/2016  12:17 PM

## 2016-09-13 ENCOUNTER — Telehealth: Payer: Self-pay | Admitting: *Deleted

## 2016-09-13 NOTE — Telephone Encounter (Addendum)
5/16 1640: Per Elizebeth Koller: Pt called to report he has decided to proceed with chemo. He also reports Dilaudid is not as effective as Hydrocodone for his pain. Pt is requesting refill of Hydrocodone. Will review with MD when he is back in office on 5/17.

## 2016-09-18 MED ORDER — HYDROCODONE-ACETAMINOPHEN 10-325 MG PO TABS: 1.0000 | ORAL_TABLET | Freq: Four times a day (QID) | ORAL | 0 refills | Status: DC | PRN

## 2016-09-18 NOTE — Telephone Encounter (Signed)
Pt came in to office for refill. Hydrocodone refilled, per Dr. Benay Spice. He reports he has not filled Hydromorphone script. He will bring it back to office to be destroyed.  Provided pt with office contact information. Requested he contact collaborative RN in the future for refills, symptoms and side effects. He voiced understanding.

## 2016-09-19 ENCOUNTER — Other Ambulatory Visit: Payer: Self-pay | Admitting: Radiology

## 2016-09-19 ENCOUNTER — Ambulatory Visit: Payer: Medicaid Other | Admitting: Oncology

## 2016-09-19 ENCOUNTER — Telehealth: Payer: Self-pay | Admitting: *Deleted

## 2016-09-19 NOTE — Telephone Encounter (Signed)
Patient called in stating

## 2016-09-20 ENCOUNTER — Telehealth: Payer: Self-pay | Admitting: *Deleted

## 2016-09-20 ENCOUNTER — Ambulatory Visit (HOSPITAL_COMMUNITY): Admission: RE | Admit: 2016-09-20 | Payer: Medicaid Other | Source: Ambulatory Visit

## 2016-09-20 ENCOUNTER — Telehealth: Payer: Self-pay | Admitting: Oncology

## 2016-09-20 ENCOUNTER — Other Ambulatory Visit (HOSPITAL_COMMUNITY): Payer: Medicaid Other

## 2016-09-20 NOTE — Telephone Encounter (Signed)
Call received from [facility] in IR stating that pt did not show for port placement today.  Call placed to patient and patient stated that he did not want to have port placed until he saw Dr. Benay Spice and his surgeon. Patient did not show for appt with Dr. Benay Spice on 09/19/16 as well.  Patient agreeable to coming in on Friday to see Dr. Benay Spice.  Informed pt that I would call him back with time.

## 2016-09-20 NOTE — Telephone Encounter (Signed)
Call placed to patient to confirm appt time for Friday, 09/22/16 at 0900.  Patient states that he will be here for appt.

## 2016-09-20 NOTE — Telephone Encounter (Signed)
Left message re 5/25 f/u.

## 2016-09-21 ENCOUNTER — Telehealth: Payer: Self-pay | Admitting: *Deleted

## 2016-09-21 NOTE — Progress Notes (Signed)
  Oncology Nurse Navigator Documentation  Navigator Location: [facility] (09/21/16 1637)   Navigator Encounter Type: Telephone (09/21/16 1637)  I called patient and left a message to remind him of his appointment with Dr. Benay Spice tomorrow at 0900.                       Barriers/Navigation Needs: Coordination of Care (09/21/16 1637)   Interventions: Other (Reminded pt of appointment) (09/21/16 1637)                      Time Spent with Patient: 15 (09/21/16 1637)

## 2016-09-22 ENCOUNTER — Telehealth: Payer: Self-pay | Admitting: *Deleted

## 2016-09-22 ENCOUNTER — Ambulatory Visit: Payer: Medicaid Other | Admitting: Oncology

## 2016-09-22 NOTE — Telephone Encounter (Signed)
Call placed to f/u with patient after a no show to see Dr. Benay Spice today.  Patient states that he does not want to see Dr. Benay Spice until he meets with his surgeon, (who he was also a no show for on 09/21/16).  Instructed pt to contact [facility] to make appt with Dr. Benay Spice after he has met with his surgeon.  Patient states that he will contact [facility] once he has seen his Psychologist, sport and exercise.

## 2016-09-26 ENCOUNTER — Telehealth: Payer: Self-pay | Admitting: *Deleted

## 2016-09-26 NOTE — Progress Notes (Signed)
  Oncology Nurse Navigator Documentation  Navigator Location: [facility] (09/26/16 1353)   )Navigator Encounter Type: Telephone (09/26/16 1353)    Patient called and said his appointments were "all messed up".  He missed his appointment with Dr. Benay Spice 09/22/16 because he wanted to see the neurosurgeon first and thought he had an appointment this week.  He called their office today and his appointment is not until 10/05/16.  His prescriptions for pain medication are almost out.  I asked if he wanted his appointment with Dr. Benay Spice rescheduled and he said yes.  I spoke with Dr. Benay Spice and with [facility] and left patient a message to see if he could come to [facility] at 2:00PM.  Pt is dependent on the bus for transportation and is not able to come today.  I told him that Dr. Benay Spice has scheduled him at 10:15 tomorrow.  Patient said he would be able to come to the appointment.                      Barriers/Navigation Needs: Coordination of Care (09/26/16 1353)   Interventions: Coordination of Care (Schedule appointment) (09/26/16 1353)   Coordination of Care: Appts (09/26/16 1353)                  Time Spent with Patient: 15 (09/26/16 1353)

## 2016-09-27 ENCOUNTER — Encounter: Payer: Self-pay | Admitting: *Deleted

## 2016-09-27 ENCOUNTER — Ambulatory Visit (HOSPITAL_BASED_OUTPATIENT_CLINIC_OR_DEPARTMENT_OTHER): Payer: Medicaid Other | Admitting: Oncology

## 2016-09-27 ENCOUNTER — Telehealth: Payer: Self-pay | Admitting: Oncology

## 2016-09-27 ENCOUNTER — Other Ambulatory Visit: Payer: Self-pay | Admitting: *Deleted

## 2016-09-27 VITALS — BP 130/77 | HR 70 | Temp 98.3°F | Resp 18 | Ht 74.0 in | Wt 143.6 lb

## 2016-09-27 DIAGNOSIS — G893 Neoplasm related pain (acute) (chronic): Secondary | ICD-10-CM

## 2016-09-27 DIAGNOSIS — C189 Malignant neoplasm of colon, unspecified: Secondary | ICD-10-CM | POA: Diagnosis not present

## 2016-09-27 DIAGNOSIS — Z85118 Personal history of other malignant neoplasm of bronchus and lung: Secondary | ICD-10-CM | POA: Diagnosis not present

## 2016-09-27 DIAGNOSIS — C7951 Secondary malignant neoplasm of bone: Secondary | ICD-10-CM | POA: Diagnosis not present

## 2016-09-27 DIAGNOSIS — C787 Secondary malignant neoplasm of liver and intrahepatic bile duct: Secondary | ICD-10-CM | POA: Diagnosis not present

## 2016-09-27 MED ORDER — MORPHINE SULFATE ER 60 MG PO TBCR: 60.0000 mg | EXTENDED_RELEASE_TABLET | Freq: Two times a day (BID) | ORAL | 0 refills | Status: DC

## 2016-09-27 MED ORDER — LEVOTHYROXINE SODIUM 137 MCG PO TABS: 137.0000 ug | ORAL_TABLET | Freq: Every day | ORAL | 0 refills | Status: DC

## 2016-09-27 MED ORDER — HYDROCODONE-ACETAMINOPHEN 10-325 MG PO TABS: 1.0000 | ORAL_TABLET | ORAL | 0 refills | Status: DC | PRN

## 2016-09-27 NOTE — Addendum Note (Signed)
Addended by: Brien Few on: 09/27/2016 04:05 PM   Modules accepted: Orders

## 2016-09-27 NOTE — Progress Notes (Signed)
[place] [employer]  [employer] representative received referral from Therapist, sports for transportation concerns.  Patient uses the bus as transportation to his appointments, but increased pain has made this very difficult.  CSW met with patient in [facility] lobby to discuss transportation resources.  CSW and patient discussed SCAT and Medicaid transportation.  Patient was agreeable to completing a SCAT application.  CSW reviewed application and submitted.  SCAT will contact patient once reviewed.  Patient also stated he was familiar with Medicaid transportation and has used in the past.  Due to the urgency CSW completed a taxi voucher for patient to get home today, and for his appointment in the morning (5/31).  CSW contacted [employer] taxi and arranged for transportation to [facility] in the morning.  CSW also informed nursing staff of transportation arrangements and contact number to call when patient is ready to return home (5/31).  CSW provided nursing staff taxi voucher for patient to return home after his appointments tomorrow.  Patient is aware and verbalized understanding.    Johnnye Lana, MSW, LCSW, OSW-C Clinical Social Worker [facility] [phone]

## 2016-09-27 NOTE — Progress Notes (Addendum)
Histology and Location of Primary Cancer: Metastatic colon cancer/lung ca now bone mets  Sites of Visceral and Bony Metastatic Disease: Right anterior  iliac ,left posterior iliac  Location(s) of Symptomatic Metastases: lumbar spine L3, T4,T11   Past/Anticipated chemotherapy by medical oncology, if any: Dr. Benay Spice  09/27/16 appt ,referral for  Palliative  Radiation  lumbar spine  Pain on a scale of 0-10 is: Chronic back pain, left lower back,  was in pain clinic [facility], , takes MSIR bid,and hydrocodone prn   If Spine Met(s), symptoms, if any, include:  Bowel/Bladder retention or incontinence (please describe): no  Numbness or weakness in extremities (please describe): yes weakness unable to walk without cane, or stand more than a few steps  Current Decadron regimen, if applicable: started 33m bid x 2 days  Then 1 tab 452mbid  Ambulatory status? Walker? Wheelchair?:w/c  SAFETY ISSUES:yes  [place]  Colon cancer  S/p polypectomy 2017=adenocarcinoma,  Current Complaints / other details:  Disabled, anxiety,depression,PVC'S Thyroid disease, LUL lobectomy 12/04/12 lung cancer, nscca ; lipoma of back, Lumbar surgery 05/2016,20 lb weight loss since then Mother bone cancer, Father unknown cancer, maternal uncle colon cancer,   Allergies:Morphine and related=N&V, Suboxone N&V,Vancomycin=Hives  BP 128/77   Pulse 87   Temp 97.8 F (36.6 C) (Oral)   Resp 18   Ht _0  (1.93 m)   Wt 136 lb 9.6 oz (62 kg)   SpO2 100%   BMI 16.63 kg/m   Wt Readings from Last 3 Encounters:  09/28/16 136 lb 9.6 oz (62 kg)  09/27/16 143 lb 9.6 oz (65.1 kg)  09/11/16 143 lb 4.8 oz (65 kg)

## 2016-09-27 NOTE — Progress Notes (Addendum)
error 

## 2016-09-27 NOTE — Telephone Encounter (Signed)
Follow up appointment scheduled with Dr Benay Spice on 10/05/16 @ 11:15 a.m. per 09/27/16 los. Patient was given a copy of the appointment schedule and AVS report per 09/27/16 los.

## 2016-09-27 NOTE — Progress Notes (Signed)
Edgar Ross   Diagnosis: Colon cancer  INTERVAL HISTORY:   Edgar Ross returns after missing the scheduled Port-A-Cath placement and an office visit with Edgar Ross last week. He reports increased mid lower back pain that is worse with ambulation. This limits his ability to ambulate secondary to pain when standing upright. No leg weakness or numbness. No difficulty with bowel or bladder function. He reports the "hip "and leg pain has improved. The pain is not relieved with the current narcotic regimen. He took an extra dose of MS Contin this week and noted improvement. He will agree to a trial of chemotherapy, but would like to address the pain first.  Objective:  Vital signs in last 24 hours:  Blood pressure 130/77, pulse 70, temperature 98.3 F (36.8 C), temperature source Oral, resp. rate 18, height 6' 2"  (1.88 m), weight 143 lb 9.6 oz (65.1 kg), SpO2 100 %.    HEENT: No thrush Resp: Lungs clear bilaterally Cardio: Regular rate and rhythm GI: No hepatosplenomegaly, nontender Vascular: No leg edema Neuro: The leg strength is intact in the legs bilaterally  Musculoskeletal: Tender at the low lumbar spine/sacrum   Lab Results:  Lab Results  Component Value Date   WBC 4.0 08/20/2016   HGB 11.7 (L) 08/20/2016   HCT 35.8 (L) 08/20/2016   MCV 85.6 08/20/2016   PLT 265 08/20/2016   NEUTROABS 2.4 08/19/2016    CMP     Component Value Date/Time   NA 138 08/20/2016 0550   K 4.4 08/20/2016 0550   CL 105 08/20/2016 0550   CO2 22 08/20/2016 0550   GLUCOSE 153 (H) 08/20/2016 0550   BUN 12 08/20/2016 0550   CREATININE 0.79 08/20/2016 0550   CALCIUM 9.1 08/20/2016 0550   PROT 6.6 08/20/2016 0550   ALBUMIN 3.0 (L) 08/20/2016 0550   AST 30 08/20/2016 0550   ALT 29 08/20/2016 0550   ALKPHOS 140 (H) 08/20/2016 0550   BILITOT 0.6 08/20/2016 0550   GFRNONAA >60 08/20/2016 0550   GFRAA >60 08/20/2016 0550    No results found for: CEA1  Lab Results   Component Value Date   INR 0.94 12/02/2012    Imaging:  No results found.  Medications: I have reviewed the patient's current medications.  Assessment/Plan: 1. Colon cancer, status post a polypectomy in 2017 revealing a focus of adenocarcinoma ? Back pain with tumor involving L3, status post an L3 corpectomy 05/28/2016 confirming metastatic adenocarcinoma consistent with a colon primary (cytokeratin 20 and CDX2positive), comparison of the pathology to the 2017 polypectomy showed similar morphology; K-ras mutation detected;no evidence of DNA mismatch repair deficiency; intact tumor nuclear staining for MLH1, MSH2, MSH6 and PMS2 ? He completed postoperative radiation to the lumbar spine, 3000 cGy in 10 fractions, 06/26/2016-07/10/2016 ? PET scan 06/19/2016 revealed multiple hypermetabolic bone metastases, hypermetabolic liver metastases, indeterminate mildlyhypermetabolic axillary lymph nodes ? Status post palliative radiation to the lumbar spine ? CTs 09/05/2016-diffuse liver metastases. Lytic bone metastases in the thoracic/lumbar spine and left ilium.  2. Non-small cell lung cancer, adenocarcinoma, T2 N0, ALK positive, status post a left upper lobectomy August 2014  3. Pain secondary to #1  4. History of a thyroidectomy  5. Colonoscopy 07/07/2016-benign polypoid lesion the sigmoid colon-biopsied, diminutive polyps in the rectum, tattoo in the sigmoid colon-site appeared normal  Disposition:  Edgar Ross continues to have severe pain in the lower back. I explained the pain is very likely related to metastatic colon cancer involving the bones. He  agrees to a radiation oncology referral to consider palliative radiation. I contacted Dr. Lisbeth Renshaw. They will see him on 09/28/2016.  We increased the OxyContin dose to 60 mg twice daily. He feels the hydrocodone helps his pain. He will continue hydrocodone for breakthrough pain.  He will return for an office visit on 10/05/2016. The  plan is to refer him for Port-A-Cath placement and schedule chemotherapy when his pain is under better control.  30 minutes were spent with the patient today. The majority of the time was used for counseling and coordination of care.  Donneta Romberg, MD  09/27/2016  3:16 PM

## 2016-09-28 ENCOUNTER — Ambulatory Visit
Admission: RE | Admit: 2016-09-28 | Discharge: 2016-09-28 | Disposition: A | Discharge status: Home / Self Care | Payer: Medicaid Other | Source: Ambulatory Visit | Attending: Radiation Oncology | Admitting: Radiation Oncology

## 2016-09-28 ENCOUNTER — Encounter: Payer: Self-pay | Admitting: Radiation Oncology

## 2016-09-28 VITALS — BP 136/91 | HR 90 | Temp 97.8°F | Resp 20 | Ht 76.0 in | Wt 136.6 lb

## 2016-09-28 DIAGNOSIS — C787 Secondary malignant neoplasm of liver and intrahepatic bile duct: Secondary | ICD-10-CM | POA: Insufficient documentation

## 2016-09-28 DIAGNOSIS — Z881 Allergy status to other antibiotic agents status: Secondary | ICD-10-CM | POA: Diagnosis not present

## 2016-09-28 DIAGNOSIS — F329 Major depressive disorder, single episode, unspecified: Secondary | ICD-10-CM | POA: Diagnosis not present

## 2016-09-28 DIAGNOSIS — Z85118 Personal history of other malignant neoplasm of bronchus and lung: Secondary | ICD-10-CM | POA: Insufficient documentation

## 2016-09-28 DIAGNOSIS — Z885 Allergy status to narcotic agent status: Secondary | ICD-10-CM | POA: Insufficient documentation

## 2016-09-28 DIAGNOSIS — E890 Postprocedural hypothyroidism: Secondary | ICD-10-CM | POA: Insufficient documentation

## 2016-09-28 DIAGNOSIS — F17210 Nicotine dependence, cigarettes, uncomplicated: Secondary | ICD-10-CM | POA: Diagnosis not present

## 2016-09-28 DIAGNOSIS — C7951 Secondary malignant neoplasm of bone: Secondary | ICD-10-CM

## 2016-09-28 DIAGNOSIS — M545 Low back pain, unspecified: Secondary | ICD-10-CM

## 2016-09-28 DIAGNOSIS — Z808 Family history of malignant neoplasm of other organs or systems: Secondary | ICD-10-CM | POA: Diagnosis not present

## 2016-09-28 DIAGNOSIS — Z79899 Other long term (current) drug therapy: Secondary | ICD-10-CM | POA: Insufficient documentation

## 2016-09-28 DIAGNOSIS — M25559 Pain in unspecified hip: Secondary | ICD-10-CM | POA: Diagnosis not present

## 2016-09-28 DIAGNOSIS — Z923 Personal history of irradiation: Secondary | ICD-10-CM | POA: Diagnosis not present

## 2016-09-28 DIAGNOSIS — G4700 Insomnia, unspecified: Secondary | ICD-10-CM | POA: Insufficient documentation

## 2016-09-28 DIAGNOSIS — F419 Anxiety disorder, unspecified: Secondary | ICD-10-CM | POA: Insufficient documentation

## 2016-09-28 DIAGNOSIS — Z8701 Personal history of pneumonia (recurrent): Secondary | ICD-10-CM | POA: Insufficient documentation

## 2016-09-28 DIAGNOSIS — Z510 Encounter for antineoplastic radiation therapy: Secondary | ICD-10-CM | POA: Diagnosis not present

## 2016-09-28 DIAGNOSIS — C189 Malignant neoplasm of colon, unspecified: Secondary | ICD-10-CM

## 2016-09-28 DIAGNOSIS — Z8249 Family history of ischemic heart disease and other diseases of the circulatory system: Secondary | ICD-10-CM | POA: Diagnosis not present

## 2016-09-28 DIAGNOSIS — K219 Gastro-esophageal reflux disease without esophagitis: Secondary | ICD-10-CM | POA: Diagnosis not present

## 2016-09-28 DIAGNOSIS — Z85038 Personal history of other malignant neoplasm of large intestine: Secondary | ICD-10-CM | POA: Diagnosis not present

## 2016-09-28 DIAGNOSIS — C7952 Secondary malignant neoplasm of bone marrow: Secondary | ICD-10-CM

## 2016-09-28 DIAGNOSIS — M19071 Primary osteoarthritis, right ankle and foot: Secondary | ICD-10-CM | POA: Insufficient documentation

## 2016-09-28 DIAGNOSIS — C3412 Malignant neoplasm of upper lobe, left bronchus or lung: Secondary | ICD-10-CM

## 2016-09-28 LAB — HM COLONOSCOPY

## 2016-09-28 MED ORDER — HYDROMORPHONE HCL 4 MG/ML IJ SOLN
1.0000 mg | Freq: Once | INTRAMUSCULAR | Status: AC
  Administered 2016-09-28: 1 mg via INTRAMUSCULAR
  Filled 2016-09-28: qty 1

## 2016-09-28 NOTE — Progress Notes (Signed)
Elmyra Ricks RT Therapist Ct sim, stated patient needed something for pain cannot lay down oin flat table, back pain, last pain med given at home 8am, per verbal orders to give 1mg  IM dilaudid by Shona Simpson, PA-C, verified with jennifer Malmfelt 1mg  Dilaudid, drew up with filter needle and then exchanged for sterile 1 inch needle, patient gave name and dob as identification, pain 10/10 scale,  Dilaidid given left deltoid, patient tolerated well, will get vitals once completed ct simulation 10:42 AM, patient has his taxi cab voucher in hand will call [employer] when he is done

## 2016-09-28 NOTE — Progress Notes (Signed)
Please see the Nurse Progress Note in the MD Initial Consult Encounter for this patient. 

## 2016-09-28 NOTE — Addendum Note (Signed)
Encounter addended by: Doreen Beam, RN on: 09/28/2016 12:17 PM<BR>    Actions taken: Vitals modified
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
## 2016-09-29 DIAGNOSIS — C7951 Secondary malignant neoplasm of bone: Secondary | ICD-10-CM | POA: Insufficient documentation

## 2016-09-29 DIAGNOSIS — Z510 Encounter for antineoplastic radiation therapy: Secondary | ICD-10-CM | POA: Diagnosis not present

## 2016-09-29 NOTE — Progress Notes (Signed)
  Radiation Oncology         (336) [phone] ________________________________  Name: Dannell Gortney MRN: [id number]  Date: 09/28/2016  DOB: 09-08-1950  SIMULATION AND TREATMENT PLANNING NOTE  DIAGNOSIS:     ICD-9-CM ICD-10-CM   1. Bone metastasis (H[facility]) 198.5 C79.51      Site:   1.  T11 - L1 2.  Left pelvis  NARRATIVE:  The patient was brought to the [facility].  Identity was confirmed.  All relevant records and images related to the planned course of therapy were reviewed.   Written consent to proceed with treatment was confirmed which was freely given after reviewing the details related to the planned course of therapy had been reviewed with the patient.  Then, the patient was set-up in a stable reproducible  supine position for radiation therapy.  CT images were obtained.  Surface markings were placed.    Medically necessary complex treatment device(s) for immobilization:  Customized vac-lock bag.   The CT images were loaded into the planning software.  Then the target and avoidance structures were contoured.  Treatment planning then occurred.  The radiation prescription was entered and confirmed.  A total of 4 complex treatment devices were fabricated which relate to the designed radiation treatment fields. Each of these customized fields/ complex treatment devices will be used on a daily basis during the radiation course. I have requested : 3D Simulation  I have requested a DVH of the following structures: target volume, left kidney, right kidney, bowel, cauda.   PLAN:  The patient will receive 30 Gy in 10 fractions.  ________________________________   Jodelle Gross, MD, PhD

## 2016-10-02 ENCOUNTER — Ambulatory Visit
Admission: RE | Admit: 2016-10-02 | Discharge: 2016-10-02 | Disposition: A | Discharge status: Home / Self Care | Payer: Medicaid Other | Source: Ambulatory Visit | Attending: Radiation Oncology | Admitting: Radiation Oncology

## 2016-10-02 ENCOUNTER — Encounter: Payer: Self-pay | Admitting: Radiation Oncology

## 2016-10-02 DIAGNOSIS — Z510 Encounter for antineoplastic radiation therapy: Secondary | ICD-10-CM | POA: Diagnosis not present

## 2016-10-02 NOTE — Progress Notes (Signed)
Bone metastasis ([place]) 198.5 C79.51    I was called to the [facility] due to patient difficulties today.  Therapist informed me that patient was having difficulty cooperating. Stated his back pain was significant.  I spoke to the patient about his difficulty.  I offered a dose of pain medication now to help with treatment.  Verified with patient that he did not feel he had pain due to acute trauma or injury.  Patient refused medication. He stated he has long acting and short acting pain meds. He expressed desire to complete his radiation treatment now.  "That's what I'm here for." he stated.   As therapists gently and gradually shifted his supportive devices, patient yelled out. Expressed pain.  Refused further treatment today.  I encouraged him to come in 30 min early tomorrow to speak with Shona Simpson, PA about pain control and treatment goals.  -----------------------------------  Eppie Gibson, MD

## 2016-10-03 ENCOUNTER — Telehealth: Payer: Self-pay | Admitting: *Deleted

## 2016-10-03 ENCOUNTER — Ambulatory Visit: Admission: RE | Admit: 2016-10-03 | Payer: Medicaid Other | Source: Ambulatory Visit

## 2016-10-03 DIAGNOSIS — Z510 Encounter for antineoplastic radiation therapy: Secondary | ICD-10-CM | POA: Diagnosis not present

## 2016-10-03 NOTE — Telephone Encounter (Signed)
Called patient home, asked how he was doing and if he wanted to come in 30 minutes early ro see Bryson Ha our PA to discuss his pain mgt?, he was still talking about yesterdays frustration with pain , and he wasn't sure if he could get up this afternoon, because every tine he gets up his back is feels like its going to break pain 10/10, he cannot keep calling a cab to come to the hospital, asked him if the Social workers has helped him arrange transportation with [facility]? "yes, I'm supposed to call them today, but if I can't walk when I get up I will need to go to the ER", asked that he try and come in today to see Bryson Ha, will check on him tomorrow 10:42 AM

## 2016-10-04 ENCOUNTER — Ambulatory Visit: Payer: Medicaid Other

## 2016-10-05 ENCOUNTER — Encounter: Payer: Self-pay | Admitting: *Deleted

## 2016-10-05 ENCOUNTER — Inpatient Hospital Stay (HOSPITAL_COMMUNITY)
Admission: AD | Admit: 2016-10-05 | Discharge: 2016-10-20 | DRG: 948 | Disposition: A | Discharge status: Home Health | Payer: Medicaid Other | Source: Ambulatory Visit | Attending: Family Medicine | Admitting: Family Medicine

## 2016-10-05 ENCOUNTER — Ambulatory Visit (HOSPITAL_BASED_OUTPATIENT_CLINIC_OR_DEPARTMENT_OTHER): Payer: Medicaid Other | Admitting: Oncology

## 2016-10-05 ENCOUNTER — Other Ambulatory Visit: Payer: Self-pay | Admitting: *Deleted

## 2016-10-05 ENCOUNTER — Ambulatory Visit: Payer: Medicaid Other

## 2016-10-05 ENCOUNTER — Encounter (HOSPITAL_COMMUNITY): Payer: Self-pay

## 2016-10-05 VITALS — BP 147/89 | HR 75 | Temp 97.8°F | Resp 17 | Ht 76.0 in | Wt 145.5 lb

## 2016-10-05 DIAGNOSIS — C7951 Secondary malignant neoplasm of bone: Secondary | ICD-10-CM | POA: Diagnosis present

## 2016-10-05 DIAGNOSIS — F329 Major depressive disorder, single episode, unspecified: Secondary | ICD-10-CM | POA: Diagnosis present

## 2016-10-05 DIAGNOSIS — C189 Malignant neoplasm of colon, unspecified: Secondary | ICD-10-CM

## 2016-10-05 DIAGNOSIS — Z981 Arthrodesis status: Secondary | ICD-10-CM

## 2016-10-05 DIAGNOSIS — E871 Hypo-osmolality and hyponatremia: Secondary | ICD-10-CM | POA: Diagnosis present

## 2016-10-05 DIAGNOSIS — K219 Gastro-esophageal reflux disease without esophagitis: Secondary | ICD-10-CM | POA: Diagnosis present

## 2016-10-05 DIAGNOSIS — Z923 Personal history of irradiation: Secondary | ICD-10-CM

## 2016-10-05 DIAGNOSIS — Z79899 Other long term (current) drug therapy: Secondary | ICD-10-CM | POA: Diagnosis not present

## 2016-10-05 DIAGNOSIS — C787 Secondary malignant neoplasm of liver and intrahepatic bile duct: Secondary | ICD-10-CM | POA: Diagnosis present

## 2016-10-05 DIAGNOSIS — F17210 Nicotine dependence, cigarettes, uncomplicated: Secondary | ICD-10-CM | POA: Diagnosis present

## 2016-10-05 DIAGNOSIS — Z85118 Personal history of other malignant neoplasm of bronchus and lung: Secondary | ICD-10-CM

## 2016-10-05 DIAGNOSIS — R454 Irritability and anger: Secondary | ICD-10-CM | POA: Diagnosis present

## 2016-10-05 DIAGNOSIS — Z902 Acquired absence of lung [part of]: Secondary | ICD-10-CM | POA: Diagnosis not present

## 2016-10-05 DIAGNOSIS — G893 Neoplasm related pain (acute) (chronic): Secondary | ICD-10-CM | POA: Diagnosis present

## 2016-10-05 DIAGNOSIS — I10 Essential (primary) hypertension: Secondary | ICD-10-CM | POA: Diagnosis present

## 2016-10-05 DIAGNOSIS — D696 Thrombocytopenia, unspecified: Secondary | ICD-10-CM | POA: Diagnosis present

## 2016-10-05 DIAGNOSIS — C7982 Secondary malignant neoplasm of genital organs: Secondary | ICD-10-CM | POA: Diagnosis present

## 2016-10-05 DIAGNOSIS — M545 Low back pain, unspecified: Secondary | ICD-10-CM | POA: Diagnosis present

## 2016-10-05 DIAGNOSIS — M62838 Other muscle spasm: Secondary | ICD-10-CM | POA: Diagnosis present

## 2016-10-05 DIAGNOSIS — C773 Secondary and unspecified malignant neoplasm of axilla and upper limb lymph nodes: Secondary | ICD-10-CM | POA: Diagnosis present

## 2016-10-05 DIAGNOSIS — Z9181 History of falling: Secondary | ICD-10-CM

## 2016-10-05 DIAGNOSIS — Z515 Encounter for palliative care: Secondary | ICD-10-CM | POA: Diagnosis not present

## 2016-10-05 DIAGNOSIS — E890 Postprocedural hypothyroidism: Secondary | ICD-10-CM | POA: Diagnosis present

## 2016-10-05 DIAGNOSIS — Z7189 Other specified counseling: Secondary | ICD-10-CM

## 2016-10-05 DIAGNOSIS — M5440 Lumbago with sciatica, unspecified side: Secondary | ICD-10-CM | POA: Diagnosis not present

## 2016-10-05 DIAGNOSIS — Z79891 Long term (current) use of opiate analgesic: Secondary | ICD-10-CM | POA: Diagnosis not present

## 2016-10-05 DIAGNOSIS — Z885 Allergy status to narcotic agent status: Secondary | ICD-10-CM

## 2016-10-05 DIAGNOSIS — K5903 Drug induced constipation: Secondary | ICD-10-CM | POA: Diagnosis not present

## 2016-10-05 DIAGNOSIS — M19071 Primary osteoarthritis, right ankle and foot: Secondary | ICD-10-CM | POA: Diagnosis present

## 2016-10-05 DIAGNOSIS — Z9119 Patient's noncompliance with other medical treatment and regimen: Secondary | ICD-10-CM

## 2016-10-05 DIAGNOSIS — Z881 Allergy status to other antibiotic agents status: Secondary | ICD-10-CM

## 2016-10-05 DIAGNOSIS — T402X5A Adverse effect of other opioids, initial encounter: Secondary | ICD-10-CM | POA: Diagnosis not present

## 2016-10-05 LAB — CBC WITH DIFFERENTIAL/PLATELET
Basophils Absolute: 0 10*3/uL (ref 0.0–0.1)
Basophils Relative: 0 %
Eosinophils Absolute: 0.1 10*3/uL (ref 0.0–0.7)
Eosinophils Relative: 2 %
HCT: 39.1 % (ref 39.0–52.0)
Hemoglobin: 13.3 g/dL (ref 13.0–17.0)
LYMPHS ABS: 0.8 10*3/uL (ref 0.7–4.0)
LYMPHS PCT: 14 %
MCH: 28.9 pg (ref 26.0–34.0)
MCHC: 34 g/dL (ref 30.0–36.0)
MCV: 85 fL (ref 78.0–100.0)
MONOS PCT: 8 %
Monocytes Absolute: 0.5 10*3/uL (ref 0.1–1.0)
Neutro Abs: 4.5 10*3/uL (ref 1.7–7.7)
Neutrophils Relative %: 76 %
Platelets: 145 10*3/uL — ABNORMAL LOW (ref 150–400)
RBC: 4.6 MIL/uL (ref 4.22–5.81)
RDW: 16.6 % — AB (ref 11.5–15.5)
WBC: 5.9 10*3/uL (ref 4.0–10.5)

## 2016-10-05 LAB — COMPREHENSIVE METABOLIC PANEL
ALBUMIN: 3.2 g/dL — AB (ref 3.5–5.0)
ALK PHOS: 352 U/L — AB (ref 38–126)
ALT: 60 U/L (ref 17–63)
ANION GAP: 9 (ref 5–15)
AST: 52 U/L — ABNORMAL HIGH (ref 15–41)
BUN: 19 mg/dL (ref 6–20)
CALCIUM: 9 mg/dL (ref 8.9–10.3)
CHLORIDE: 97 mmol/L — AB (ref 101–111)
CO2: 27 mmol/L (ref 22–32)
Creatinine, Ser: 0.67 mg/dL (ref 0.61–1.24)
GFR calc Af Amer: >60 mL/min (ref >60)
GFR calc non Af Amer: >60 mL/min (ref >60)
GLUCOSE: 105 mg/dL — AB (ref 65–99)
POTASSIUM: 3.8 mmol/L (ref 3.5–5.1)
SODIUM: 133 mmol/L — AB (ref 135–145)
Total Bilirubin: 0.8 mg/dL (ref 0.3–1.2)
Total Protein: 6.4 g/dL — ABNORMAL LOW (ref 6.5–8.1)

## 2016-10-05 MED ORDER — SODIUM CHLORIDE 0.9 % IV SOLN
INTRAVENOUS | Status: DC
  Administered 2016-10-05 – 2016-10-07 (×3): via INTRAVENOUS

## 2016-10-05 MED ORDER — ACETAMINOPHEN 325 MG PO TABS: 650.0000 mg | ORAL_TABLET | Freq: Four times a day (QID) | ORAL | Status: DC | PRN

## 2016-10-05 MED ORDER — SENNOSIDES-DOCUSATE SODIUM 8.6-50 MG PO TABS
1.0000 | ORAL_TABLET | Freq: Two times a day (BID) | ORAL | Status: DC
  Administered 2016-10-05 – 2016-10-20 (×29): 1 via ORAL
  Filled 2016-10-05 (×30): qty 1

## 2016-10-05 MED ORDER — MORPHINE SULFATE ER 30 MG PO TBCR
60.0000 mg | EXTENDED_RELEASE_TABLET | Freq: Two times a day (BID) | ORAL | Status: DC
  Administered 2016-10-05 – 2016-10-10 (×11): 60 mg via ORAL
  Filled 2016-10-05 (×11): qty 2

## 2016-10-05 MED ORDER — ONDANSETRON HCL 4 MG/2ML IJ SOLN: 4.0000 mg | Freq: Four times a day (QID) | INTRAMUSCULAR | Status: DC | PRN

## 2016-10-05 MED ORDER — POLYETHYLENE GLYCOL 3350 17 G PO PACK
17.0000 g | PACK | Freq: Every day | ORAL | Status: DC
  Administered 2016-10-06 – 2016-10-20 (×15): 17 g via ORAL
  Filled 2016-10-05 (×15): qty 1

## 2016-10-05 MED ORDER — PANTOPRAZOLE SODIUM 40 MG PO TBEC
40.0000 mg | DELAYED_RELEASE_TABLET | Freq: Every day | ORAL | Status: DC
  Administered 2016-10-06 – 2016-10-20 (×15): 40 mg via ORAL
  Filled 2016-10-05 (×15): qty 1

## 2016-10-05 MED ORDER — METHOCARBAMOL 1000 MG/10ML IJ SOLN
500.0000 mg | Freq: Four times a day (QID) | INTRAVENOUS | Status: DC | PRN
  Administered 2016-10-07 – 2016-10-10 (×6): 500 mg via INTRAVENOUS
  Filled 2016-10-05 (×2): qty 5
  Filled 2016-10-05 (×4): qty 550
  Filled 2016-10-05 (×2): qty 5
  Filled 2016-10-05: qty 550

## 2016-10-05 MED ORDER — HYDROMORPHONE HCL 1 MG/ML IJ SOLN
1.0000 mg | INTRAMUSCULAR | Status: DC | PRN
  Administered 2016-10-05 – 2016-10-06 (×3): 1 mg via INTRAVENOUS
  Filled 2016-10-05 (×3): qty 1

## 2016-10-05 MED ORDER — ACETAMINOPHEN 650 MG RE SUPP: 650.0000 mg | Freq: Four times a day (QID) | RECTAL | Status: DC | PRN

## 2016-10-05 MED ORDER — ONDANSETRON HCL 4 MG PO TABS
4.0000 mg | ORAL_TABLET | Freq: Four times a day (QID) | ORAL | Status: DC | PRN
  Filled 2016-10-05 (×2): qty 1

## 2016-10-05 MED ORDER — TAMSULOSIN HCL 0.4 MG PO CAPS
0.4000 mg | ORAL_CAPSULE | Freq: Every day | ORAL | Status: DC
  Administered 2016-10-06 – 2016-10-20 (×15): 0.4 mg via ORAL
  Filled 2016-10-05 (×15): qty 1

## 2016-10-05 MED ORDER — LEVOTHYROXINE SODIUM 25 MCG PO TABS
137.0000 ug | ORAL_TABLET | Freq: Every day | ORAL | Status: DC
  Administered 2016-10-06 – 2016-10-20 (×15): 137 ug via ORAL
  Filled 2016-10-05 (×15): qty 1

## 2016-10-05 NOTE — Progress Notes (Signed)
This patient does not have any active orders. Today's nurse reports in handoff at 1900 that Dr Benay Spice sent him over for admission d/t pain and she has notified "on call" that the patient has arrived to bed 1336 and needed orders. Patient is currently eating dinner with family at the bedside. He states that his back and side is starting to hurt. This Probation officer informed patient and family that I will look into getting orders. All questions answered. Will continue to follow up with on call tonight. Charge Nurse aware.

## 2016-10-05 NOTE — Progress Notes (Signed)
[place] Psychosocial Distress Screening Clinical Social Work  Clinical Social Work was referred by distress screening protocol.  The patient scored a 5 on the Psychosocial Distress Thermometer which indicates moderate distress. Clinical Social Worker met with pt on to assess for distress and other psychosocial needs. CSW met with pt on 5/30 to address needs: Clinical Social Worker received referral from [employer] for transportation concerns.  Patient uses the bus as transportation to his appointments, but increased pain has made this very difficult.  CSW met with patient in [facility] lobby to discuss transportation resources.  CSW and patient discussed SCAT and Medicaid transportation.  Patient was agreeable to completing a SCAT application.  CSW reviewed application and submitted.  [employer] will contact patient once reviewed.  Patient also stated he was familiar with Medicaid transportation and has used in the past.  Due to the urgency CSW completed a taxi voucher for patient to get home today, and for his appointment in the morning (5/31).  CSW contacted [employer] taxi and arranged for transportation to [facility] in the morning.  CSW also informed nursing staff of transportation arrangements and contact number to call when patient is ready to return home (5/31).  CSW provided nursing staff taxi voucher for patient to return home after his appointments tomorrow.  Patient is aware and verbalized understanding.    Johnnye Lana, MSW, LCSW, OSW-C Clinical Social Worker [facility] [phone]                           On chart review, pt has medicaid and can get PCS through medicaid for CNA assistance at home if he needs. CSW team to follow.                                  ONCBCN DISTRESS SCREENING 09/28/2016  Screening Type Initial Screening  Distress experienced in past week (1-10) 5  Practical problem type Transportation  Emotional problem type Depression;Nervousness/Anxiety;Adjusting to  illness;Adjusting to appearance changes  Information Concerns Type Lack of info about diagnosis;Lack of info about treatment;Lack of info about complementary therapy choices  Physical Problem type Pain;Getting around;Bathing/dressing;Tingling hands/feet  Physician notified of physical symptoms Yes  Referral to clinical social work Yes    Clinical Social Worker follow up needed: Yes.    If yes, follow up plan:  See above Loren Racer, LCSW, OSW-C Clinical Social Worker [place]  [facility] Phone: [phone] Fax: [phone]
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
## 2016-10-05 NOTE — Progress Notes (Signed)
Report called to [place] on [place].  Patient to be admitted to room 1336.  Patient transferred to 1336 via w/c with assist of K. Buyck RN.

## 2016-10-05 NOTE — Progress Notes (Signed)
[place] OFFICE PROGRESS NOTE   Diagnosis: Colon cancer  INTERVAL HISTORY:   Edgar Ross returns for scheduled visit. I referred him to radiation oncology last week to consider palliative radiation for treatment of metastatic disease to the spine and pelvis. He was scheduled to begin palliative treatment to T11-L1 and the left pelvis. He has been unable to start radiation secondary to pain. He reports inadequate pain control despite MS Contin and hydrocodone. No difficulty with bowel or bladder function. The pain is worse with standing and position change. The pain is in the mid low back. He would like to be admitted to the hospital for further evaluation.  Objective:  Vital signs in last 24 hours:  Blood pressure (!) 147/89, pulse 75, temperature 97.8 F (36.6 C), temperature source Oral, resp. rate 17, height 6' 4"  (1.93 m), weight 145 lb 8 oz (66 kg), SpO2 100 %.    HEENT: No thrush Resp: Lungs clear bilaterally Cardio: Regular rate and rhythm GI: No hepatosplenomegaly Vascular: Trace low pretibial/ankle edema bilaterally Neuro: The motor exam is intact in the legs and feet bilaterally  Musculoskeletal: The area of pain localizes to the lumbar region, no tenderness     Lab Results:  Lab Results  Component Value Date   WBC 4.0 08/20/2016   HGB 11.7 (L) 08/20/2016   HCT 35.8 (L) 08/20/2016   MCV 85.6 08/20/2016   PLT 265 08/20/2016   NEUTROABS 2.4 08/19/2016    CMP     Component Value Date/Time   NA 138 08/20/2016 0550   K 4.4 08/20/2016 0550   CL 105 08/20/2016 0550   CO2 22 08/20/2016 0550   GLUCOSE 153 (H) 08/20/2016 0550   BUN 12 08/20/2016 0550   CREATININE 0.79 08/20/2016 0550   CALCIUM 9.1 08/20/2016 0550   PROT 6.6 08/20/2016 0550   ALBUMIN 3.0 (L) 08/20/2016 0550   AST 30 08/20/2016 0550   ALT 29 08/20/2016 0550   ALKPHOS 140 (H) 08/20/2016 0550   BILITOT 0.6 08/20/2016 0550   GFRNONAA >60 08/20/2016 0550   GFRAA >60 08/20/2016 0550      Medications: I have reviewed the patient's current medications.  Assessment/Plan: 1. Colon cancer, status post a polypectomy in 2017 revealing a focus of adenocarcinoma ? Back pain with tumor involving L3, status post an L3 corpectomy 05/28/2016 confirming metastatic adenocarcinoma consistent with a colon primary (cytokeratin 20 and CDX2positive), comparison of the pathology to the 2017 polypectomy showed similar morphology; K-ras mutation detected;no evidence of DNA mismatch repair deficiency; intact tumor nuclear staining for MLH1, MSH2, MSH6 and PMS2 ? He completed postoperative radiation to the lumbar spine, 3000 cGy in 10 fractions, 06/26/2016-07/10/2016 ? PET scan 06/19/2016 revealed multiple hypermetabolic bone metastases, hypermetabolic liver metastases, indeterminate mildlyhypermetabolic axillary lymph nodes ? Status post palliative radiation to the lumbar spine ? CTs 09/05/2016-diffuse liver metastases. Lytic bone metastases in the thoracic/lumbar spine and left ilium.  2. Non-small cell lung cancer, adenocarcinoma, T2 N0, ALK positive, status post a left upper lobectomy August 2014  3. Pain secondary to #1  4. History of a thyroidectomy  5. Colonoscopy 07/07/2016-benign polypoid lesion the sigmoid colon-biopsied, diminutive polyps in the rectum, tattoo in the sigmoid colon-site appeared normal    Disposition:  Edgar Ross continues to have severe pain in the low back. He has been unable to complete palliative radiation secondary to pain limiting his ability to undergo treatment. The pain is most likely related to metastatic disease involving the spine and pelvis. It  is possible the screw loosening noted on [place] images is contributing to the pain.  Edgar Ross is asking to be admitted for pain control. We will arrange for hospital admission by the medical service. I will follow him in the hospital. The plan is to begin systemic therapy for treatment of  metastatic colon cancer when the pain is under better control.  Recommendations: 1. Admit for pain control 2. Consult Dr. Cyndy Freeze regarding the potential contribution of lumbar hardware abnormalities contributing to his pain 3. Proceed with palliative radiation when he is able  25 minutes were spent with the patient today. The majority of the time was used for counseling and coordination of care.  Donneta Romberg, MD  10/05/2016  11:39 AM

## 2016-10-06 ENCOUNTER — Other Ambulatory Visit: Payer: Self-pay | Admitting: Oncology

## 2016-10-06 ENCOUNTER — Telehealth: Payer: Self-pay | Admitting: *Deleted

## 2016-10-06 ENCOUNTER — Inpatient Hospital Stay (HOSPITAL_COMMUNITY): Payer: Medicaid Other

## 2016-10-06 ENCOUNTER — Ambulatory Visit: Payer: Medicaid Other

## 2016-10-06 ENCOUNTER — Ambulatory Visit: Payer: Medicaid Other | Admitting: Radiation Oncology

## 2016-10-06 DIAGNOSIS — C189 Malignant neoplasm of colon, unspecified: Secondary | ICD-10-CM

## 2016-10-06 DIAGNOSIS — C787 Secondary malignant neoplasm of liver and intrahepatic bile duct: Secondary | ICD-10-CM

## 2016-10-06 DIAGNOSIS — Z85118 Personal history of other malignant neoplasm of bronchus and lung: Secondary | ICD-10-CM

## 2016-10-06 DIAGNOSIS — G893 Neoplasm related pain (acute) (chronic): Principal | ICD-10-CM

## 2016-10-06 LAB — BASIC METABOLIC PANEL
Anion gap: 6 (ref 5–15)
BUN: 18 mg/dL (ref 6–20)
CALCIUM: 8.7 mg/dL — AB (ref 8.9–10.3)
CO2: 28 mmol/L (ref 22–32)
Chloride: 98 mmol/L — ABNORMAL LOW (ref 101–111)
Creatinine, Ser: 0.75 mg/dL (ref 0.61–1.24)
GFR calc Af Amer: >60 mL/min (ref >60)
GLUCOSE: 101 mg/dL — AB (ref 65–99)
Potassium: 4.7 mmol/L (ref 3.5–5.1)
SODIUM: 132 mmol/L — AB (ref 135–145)

## 2016-10-06 LAB — CBC
HEMATOCRIT: 39.3 % (ref 39.0–52.0)
Hemoglobin: 13.5 g/dL (ref 13.0–17.0)
MCH: 29.3 pg (ref 26.0–34.0)
MCHC: 34.4 g/dL (ref 30.0–36.0)
MCV: 85.4 fL (ref 78.0–100.0)
PLATELETS: 134 10*3/uL — AB (ref 150–400)
RBC: 4.6 MIL/uL (ref 4.22–5.81)
RDW: 16.6 % — AB (ref 11.5–15.5)
WBC: 5.5 10*3/uL (ref 4.0–10.5)

## 2016-10-06 MED ORDER — OXYCODONE-ACETAMINOPHEN 5-325 MG PO TABS: 1.0000 | ORAL_TABLET | ORAL | Status: DC | PRN

## 2016-10-06 MED ORDER — OXYCODONE HCL 5 MG PO TABS: 5.0000 mg | ORAL_TABLET | ORAL | Status: DC | PRN

## 2016-10-06 MED ORDER — HYDROMORPHONE HCL 1 MG/ML IJ SOLN
1.0000 mg | INTRAMUSCULAR | Status: DC | PRN
  Administered 2016-10-06: 2 mg via INTRAVENOUS
  Administered 2016-10-06: 1 mg via INTRAVENOUS
  Administered 2016-10-06 – 2016-10-07 (×3): 2 mg via INTRAVENOUS
  Administered 2016-10-07: 1 mg via INTRAVENOUS
  Administered 2016-10-08 – 2016-10-11 (×20): 2 mg via INTRAVENOUS
  Filled 2016-10-06 (×6): qty 2
  Filled 2016-10-06: qty 1
  Filled 2016-10-06 (×12): qty 2
  Filled 2016-10-06 (×2): qty 1
  Filled 2016-10-06 (×7): qty 2

## 2016-10-06 MED ORDER — OXYCODONE-ACETAMINOPHEN 5-325 MG PO TABS
1.0000 | ORAL_TABLET | ORAL | Status: DC | PRN
  Administered 2016-10-06 – 2016-10-10 (×18): 2 via ORAL
  Filled 2016-10-06 (×7): qty 2
  Filled 2016-10-06: qty 1
  Filled 2016-10-06 (×10): qty 2
  Filled 2016-10-06: qty 1
  Filled 2016-10-06: qty 2

## 2016-10-06 MED ORDER — GADOBENATE DIMEGLUMINE 529 MG/ML IV SOLN: 15.0000 mL | Freq: Once | INTRAVENOUS | Status: DC | PRN

## 2016-10-06 NOTE — Progress Notes (Signed)
Patient ID: Edgar Ross, male   DOB: 1951-01-10, 66 y.o.   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  Assessment & Plan:   Principal Problem:   Low back pain - Secondary to metastatic colon cancer, metastasis to the spine - MRI of the lumbar spine requested and still pending - Neurosurgery consulted - Provide analgesia as needed for adequate pain control  Active Problems:   Colon cancer ([place])   Bone metastasis ([place]) - Appreciate oncologist assistance.    Hyponatremia - Prerenal in etiology, keep on IV fluids - BMP in the morning    Thrombocytopenia  - Mild and likely reactive - CBC in the morning  DVT prophylaxis: SCDs Code Status: Full Family Communication: Patient at bedside  Disposition Plan: To be determined  Consultants:   Oncology  Neurosurgery  Procedures:   None  Antimicrobials:   None  Subjective: Persistent low back pain  Objective: Vitals:   10/05/16 2049 10/06/16 0623  BP: 129/86 126/87  Pulse: 73 67  Resp: 16 16  Temp: 98 F (36.7 C) 98.3 F (36.8 C)  TempSrc: Oral Oral  SpO2: 100% 100%  Weight: 67.4 kg (148 lb 9.4 oz)   Height: 6\' 4"  (1.93 m)     Intake/Output Summary (Last 24 hours) at 10/06/16 0815 Last data filed at 10/06/16 0500  Gross per 24 hour  Intake              290 ml  Output              300 ml  Net              -10 ml   Filed Weights   10/05/16 2049  Weight: 67.4 kg (148 lb 9.4 oz)    Examination:  General exam: Appears in mild distress due to pain  Respiratory system: Clear to auscultation. Respiratory effort normal. Cardiovascular system: S1 & S2 heard, RRR. No JVD, murmurs, rubs, gallops or clicks. No pedal edema. Gastrointestinal  system: Abdomen is nondistended, soft and nontender. No organomegaly or masses felt. Central nervous system: Alert and oriented.   Data Reviewed: I have personally reviewed following labs and imaging studies  CBC:  Recent Labs Lab 10/05/16 2224 10/06/16 0407  WBC 5.9 5.5  NEUTROABS 4.5  --   HGB 13.3 13.5  HCT 39.1 39.3  MCV 85.0 85.4  PLT 145* 875*   Basic Metabolic Panel:  Recent Labs Lab 10/05/16 2224 10/06/16 0407  NA 133* 132*  K 3.8 4.7  CL 97* 98*  CO2 27 28  GLUCOSE 105* 101*  BUN 19 18  CREATININE 0.67 0.75  CALCIUM 9.0 8.7*   Liver Function Tests:  Recent Labs Lab 10/05/16 2224  AST 52*  ALT 60  ALKPHOS 352*  BILITOT 0.8  PROT 6.4*  ALBUMIN 3.2*   Urine analysis:    Component Value Date/Time   COLORURINE YELLOW 12/12/2012 [place] 12/12/2012 1127   LABSPEC 1.023 12/12/2012 1127   PHURINE 6.0 12/12/2012 1127   GLUCOSEU NEGATIVE 12/12/2012 1127   [place] 12/12/2012 1127   BILIRUBINUR NEGATIVE 12/12/2012 1127   KETONESUR 15 (A) 12/12/2012 1127   PROTEINUR NEGATIVE 12/12/2012 1127   UROBILINOGEN  2.0 (H) 12/12/2012 1127   NITRITE NEGATIVE 12/12/2012 1127   LEUKOCYTESUR NEGATIVE 12/12/2012 1127   Radiology Studies: No results found.  Scheduled Meds: . levothyroxine  137 mcg Oral QAC breakfast  . morphine  60 mg Oral Q12H  . pantoprazole  40 mg Oral Daily  . polyethylene glycol  17 g Oral Daily  . senna-docusate  1 tablet Oral BID  . tamsulosin  0.4 mg Oral QPC supper   Continuous Infusions: . sodium chloride 50 mL/hr at 10/05/16 2312  . methocarbamol (ROBAXIN)  IV       LOS: 1 day   Time spent: 35 minutes   Faye Ramsay, MD [facility] Pager [phone]  If 7PM-7AM, please contact night-coverage [email or web address] Password [email or web address] 10/06/2016, 8:15 AM

## 2016-10-06 NOTE — Progress Notes (Signed)
IP PROGRESS NOTE  Subjective:   He complains of persistent severe pain in the low back. IV Dilaudid relieve the pain partially for a few minutes. No new complaint.  Objective: Vital signs in last 24 hours: Blood pressure 126/87, pulse 67, temperature 98.3 F (36.8 C), temperature source Oral, resp. rate 16, height _0  (1.93 m), weight 148 lb 9.4 oz (67.4 kg), SpO2 100 %.  Intake/Output from previous day: 06/07 0701 - 06/08 0700 In: 290 [I.V.:290] Out: 300 [Urine:300]  Physical Exam: Musculoskeletal: The pain localizes to the lower back. Neurologic: Alert and oriented, strength intact with plantar and dorsiflexion at the feet bilaterally     Lab Results:  Recent Labs  10/05/16 2224 10/06/16 0407  WBC 5.9 5.5  HGB 13.3 13.5  HCT 39.1 39.3  PLT 145* 134*    BMET  Recent Labs  10/05/16 2224 10/06/16 0407  NA 133* 132*  K 3.8 4.7  CL 97* 98*  CO2 27 28  GLUCOSE 105* 101*  BUN 19 18  CREATININE 0.67 0.75  CALCIUM 9.0 8.7*    Studies/Results: No results found.  Medications: I have reviewed the patient's current medications.  Assessment/Plan:  1. Colon cancer, status post a polypectomy in 2017 revealing a focus of adenocarcinoma ? Back pain with tumor involving L3, status post an L3 corpectomy 05/28/2016 confirming metastatic adenocarcinoma consistent with a colon primary (cytokeratin 20 and CDX2positive), comparison of the pathology to the 2017 polypectomy showed similar morphology; K-ras mutation detected;no evidence of DNA mismatch repair deficiency; intact tumor nuclear staining for MLH1, MSH2, MSH6 and PMS2 ? He completed postoperative radiation to the lumbar spine, 3000 cGyin 10 fractions, 06/26/2016-07/10/2016 ? PET scan 06/19/2016 revealed multiple hypermetabolic bone metastases, hypermetabolic liver metastases, indeterminate mildlyhypermetabolic axillary lymph nodes ? Status post palliative radiation to the lumbar spine ? CTs 09/05/2016-diffuse  liver metastases. Lytic bone metastases in the thoracic/lumbar spine and left ilium.  2. Non-small cell lung cancer, adenocarcinoma, T2 N0, ALK positive, status post a left upper lobectomy August 2014  3. Pain secondary to #1-Severe low back pain, unrelieved with MS Contin/hydrocodone as an outpatient.  4. History of a thyroidectomy  5. Colonoscopy 07/07/2016-benign polypoid lesion the sigmoid colon-biopsied, diminutive polyps in the rectum, tattoo in the sigmoid colon-site appeared normal  Mr. Keimig continues to complain of severe pain in the lower back. The pain is most likely related to metastatic disease involving the spine and pelvis. It is possible the pain in part could be secondary to the lumbar surgery/hardware.  He requests an increased dose of Dilaudid and a trial of Percocet. I will order this.  He was scheduled for radiation simulation today. I will alert Dr. Lisbeth Renshaw of the hospital admission.  Recommendations: 1. Continue MS Contin and Dilaudid for pain 2. Trial of oxycodone for breakthrough pain 3. MRI lumbar spine, neurosurgery consult 4. Plan to begin systemic therapy for treatment of metastatic colon cancer when his pain is under better control.  Please call Oncology as needed. I will check on him over the weekend.    LOS: 1 day   Donneta Romberg, MD   10/06/2016, 8:18 AM

## 2016-10-06 NOTE — Telephone Encounter (Signed)
Called 3W for 1336,spoke with RN Acua?, asked that she medicate patient with Dilaudid highest dose for pain he will be here for CT simulation of his back/pelvis for 90 minutes on hard table, she stated she would medicate the patient 8:50 AM

## 2016-10-07 LAB — BASIC METABOLIC PANEL
Anion gap: 6 (ref 5–15)
BUN: 12 mg/dL (ref 6–20)
CHLORIDE: 99 mmol/L — AB (ref 101–111)
CO2: 30 mmol/L (ref 22–32)
CREATININE: 0.68 mg/dL (ref 0.61–1.24)
Calcium: 8.9 mg/dL (ref 8.9–10.3)
GFR calc Af Amer: >60 mL/min (ref >60)
GFR calc non Af Amer: >60 mL/min (ref >60)
Glucose, Bld: 97 mg/dL (ref 65–99)
Potassium: 3.9 mmol/L (ref 3.5–5.1)
SODIUM: 135 mmol/L (ref 135–145)

## 2016-10-07 LAB — CBC
HCT: 38.3 % — ABNORMAL LOW (ref 39.0–52.0)
HEMOGLOBIN: 13 g/dL (ref 13.0–17.0)
MCH: 29.1 pg (ref 26.0–34.0)
MCHC: 33.9 g/dL (ref 30.0–36.0)
MCV: 85.7 fL (ref 78.0–100.0)
Platelets: 139 10*3/uL — ABNORMAL LOW (ref 150–400)
RBC: 4.47 MIL/uL (ref 4.22–5.81)
RDW: 16.5 % — ABNORMAL HIGH (ref 11.5–15.5)
WBC: 5.6 10*3/uL (ref 4.0–10.5)

## 2016-10-07 LAB — HIV ANTIBODY (ROUTINE TESTING W REFLEX): HIV Screen 4th Generation wRfx: NONREACTIVE

## 2016-10-07 NOTE — Progress Notes (Signed)
Patient ID: Edgar Ross, male   DOB: 07/01/1950, 66 y.o.   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  Assessment & Plan:   Principal Problem:   Low back pain - Secondary to metastatic colon cancer, metastasis to the spine - MRI of the lumbar spine requested but pt can not lay down flat due to pain, so this is now on hold - Neurosurgery consulted but no surgical intervention is indicated - will focus on pain control for now - continue to provide analgesia as needed   Active Problems:   Colon cancer ([place])   Bone metastasis ([place]) - Appreciate oncologist assistance.    Hyponatremia - suspected to be pre renal, has now resolved with IVF - pt with better oral intake, keep on IVF one more day and plan d/c in AM    Thrombocytopenia  - improving   DVT prophylaxis: SCDs Code Status: Full Family Communication: pt at bedside  Disposition Plan: To be determined, needs PT eval  Consultants:   Oncology  Neurosurgery  PT  Procedures:   None  Antimicrobials:   None  Subjective: Persistent low back pain but better controlled with current pain medications.   Objective: Vitals:   10/06/16 0623 10/06/16 1447 10/06/16 2029 10/07/16 0456  BP: 126/87 (!) 137/93 134/74 (!) 156/95  Pulse: 67 64 62 68  Resp: 16 16 16 18   Temp: 98.3 F (36.8 C) 98.2 F (36.8 C) 98.7 F (37.1 C) 98.1 F (36.7 C)  TempSrc: Oral Oral Oral Oral  SpO2: 100% 100% 100% 100%  Weight:      Height:        Intake/Output Summary (Last 24 hours) at 10/07/16 1310 Last data filed at 10/07/16 1041  Gross per 24 hour  Intake             1715 ml  Output             1925 ml  Net             -210 ml   Filed Weights   10/05/16 2049    Weight: 67.4 kg (148 lb 9.4 oz)   Physical Exam  Constitutional: Appears to be in mild distress due to pain  CVS: RRR, S1/S2 +, no murmurs, no gallops, no carotid bruit.  Pulmonary: Effort and breath sounds normal, no stridor, rhonchi, wheezes, rales.  Abdominal: Soft. BS +,  no distension, tenderness, rebound or guarding.  Musculoskeletal: TTP in low back area   Data Reviewed: I have personally reviewed following labs and imaging studies  CBC:  Recent Labs Lab 10/05/16 2224 10/06/16 0407 10/07/16 0409  WBC 5.9 5.5 5.6  NEUTROABS 4.5  --   --   HGB 13.3 13.5 13.0  HCT 39.1 39.3 38.3*  MCV 85.0 85.4 85.7  PLT 145* 134* 793*   Basic Metabolic Panel:  Recent Labs Lab 10/05/16 2224 10/06/16 0407 10/07/16 0409  NA 133* 132* 135  K 3.8 4.7 3.9  CL 97* 98* 99*  CO2 27 28 30   GLUCOSE 105* 101* 97  BUN 19 18 12   CREATININE 0.67 0.75 0.68  CALCIUM 9.0 8.7* 8.9   Liver Function Tests:  Recent Labs Lab 10/05/16 2224  AST  52*  ALT 60  ALKPHOS 352*  BILITOT 0.8  PROT 6.4*  ALBUMIN 3.2*   Urine analysis:    Component Value Date/Time   COLORURINE YELLOW 12/12/2012 [place] 12/12/2012 1127   LABSPEC 1.023 12/12/2012 1127   PHURINE 6.0 12/12/2012 1127   GLUCOSEU NEGATIVE 12/12/2012 1127   HGBUR NEGATIVE 12/12/2012 1127   BILIRUBINUR NEGATIVE 12/12/2012 1127   KETONESUR 15 (A) 12/12/2012 1127   PROTEINUR NEGATIVE 12/12/2012 1127   UROBILINOGEN 2.0 (H) 12/12/2012 1127   NITRITE NEGATIVE 12/12/2012 1127   LEUKOCYTESUR NEGATIVE 12/12/2012 1127   Radiology Studies: No results found.  Scheduled Meds: . levothyroxine  137 mcg Oral QAC breakfast  . morphine  60 mg Oral Q12H  . pantoprazole  40 mg Oral Daily  . polyethylene glycol  17 g Oral Daily  . senna-docusate  1 tablet Oral BID  . tamsulosin  0.4 mg Oral QPC supper   Continuous Infusions: . sodium chloride 75 mL/hr at 10/06/16 2253  . methocarbamol (ROBAXIN)  IV       LOS: 2 days    Time spent: 25 minutes   Faye Ramsay, MD [facility] Pager [phone]  If 7PM-7AM, please contact night-coverage [email or web address] Password [email or web address] 10/07/2016, 1:10 PM

## 2016-10-08 ENCOUNTER — Inpatient Hospital Stay (HOSPITAL_COMMUNITY): Payer: Medicaid Other

## 2016-10-08 ENCOUNTER — Encounter (HOSPITAL_COMMUNITY): Payer: Self-pay | Admitting: Radiology

## 2016-10-08 LAB — BASIC METABOLIC PANEL
Anion gap: 8 (ref 5–15)
BUN: 13 mg/dL (ref 6–20)
CALCIUM: 9 mg/dL (ref 8.9–10.3)
CHLORIDE: 99 mmol/L — AB (ref 101–111)
CO2: 30 mmol/L (ref 22–32)
CREATININE: 0.64 mg/dL (ref 0.61–1.24)
GFR calc Af Amer: >60 mL/min (ref >60)
Glucose, Bld: 99 mg/dL (ref 65–99)
Potassium: 4.3 mmol/L (ref 3.5–5.1)
SODIUM: 137 mmol/L (ref 135–145)

## 2016-10-08 LAB — CBC
HCT: 36.8 % — ABNORMAL LOW (ref 39.0–52.0)
HEMOGLOBIN: 12.4 g/dL — AB (ref 13.0–17.0)
MCH: 28.8 pg (ref 26.0–34.0)
MCHC: 33.7 g/dL (ref 30.0–36.0)
MCV: 85.4 fL (ref 78.0–100.0)
PLATELETS: 151 10*3/uL (ref 150–400)
RBC: 4.31 MIL/uL (ref 4.22–5.81)
RDW: 16.5 % — ABNORMAL HIGH (ref 11.5–15.5)
WBC: 5.5 10*3/uL (ref 4.0–10.5)

## 2016-10-08 NOTE — Evaluation (Signed)
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  Attempts being made to determine if Hardware or metastatic disease is cause of severe back pain.  Clinical Impression  The patient mobilized very slowly to bed edge and back into bed. Will limit mobility until  It can be determined if spinal hardware is in position. Pt admitted with above diagnosis. Pt currently with functional limitations due to the deficits listed below (see PT Problem List).  Pt will benefit from skilled PT to increase their independence and safety with mobility to allow discharge to the venue listed below.       Follow Up Recommendations SNF;Supervision/Assistance - 24 hour    Equipment Recommendations  Wheelchair (measurements PT);Wheelchair cushion (measurements PT)    Recommendations for Other Services       Precautions / Restrictions Precautions Precautions: Fall      Mobility  Bed Mobility Overal bed mobility: Needs Assistance Bed Mobility: Supine to Sit;Sit to Supine     Supine to sit: HOB elevated;Min guard Sit to supine: HOB elevated;Min guard   General bed mobility comments: patient self assisted the legs to bed edge and then back onto bed. Patient moved very slowly  and guarded.   Transfers                 General transfer comment: did not attempt. need further clarification from MD due to possibility  that spinal hardware may not be aligned- Attempts  for CT scan ongoing  Ambulation/Gait                Stairs            Wheelchair Mobility    Modified Rankin (Stroke Patients Only)       Balance Overall balance assessment: Needs assistance Sitting-balance support: Feet supported;Bilateral upper extremity supported   Sitting balance - Comments:  relies on arms to take off back pressure                                     Pertinent Vitals/Pain Pain Assessment: 0-10 Pain Score: 10-Worst pain ever Pain Location: back Pain Descriptors / Indicators: Spasm Pain Intervention(s): Premedicated before session;Monitored during session;Limited activity within patient's tolerance    Home Living Family/patient expects to be discharged to:: Private residence Living Arrangements: Parent   Type of Home: Apartment Home Access: Stairs to enter Entrance Stairs-Rails: Right Entrance Stairs-Number of Steps: 13 Home Layout: One level Home Equipment: Cane - single point      Prior Function Level of Independence: Independent with assistive device(s)               Hand Dominance        Extremity/Trunk Assessment   Upper Extremity Assessment Upper Extremity Assessment: Overall WFL for tasks assessed    Lower Extremity Assessment Lower Extremity Assessment: RLE deficits/detail;LLE deficits/detail RLE Deficits / Details: did not attempt standing RLE: Unable to fully assess due to immobilization;Unable to fully assess due to pain LLE: Unable to fully assess due to pain       Communication   Communication: No difficulties  Cognition Arousal/Alertness: Awake/alert Behavior During Therapy: WFL for tasks assessed/performed;Anxious;Restless Overall Cognitive Status: Within Functional Limits for tasks assessed  General Comments: related to pain, needing scan and inability to lie flat.      General Comments      Exercises     Assessment/Plan    PT Assessment Patient needs continued PT services  PT Problem List Decreased strength;Decreased activity tolerance;Decreased mobility;Decreased knowledge of precautions;Decreased safety awareness;Decreased knowledge of use of DME;Pain       PT Treatment Interventions DME instruction;Gait training;Functional mobility  training;Therapeutic activities;Therapeutic exercise;Patient/family education;Wheelchair mobility training    PT Goals (Current goals can be found in the Care Plan section)  Acute Rehab PT Goals Patient Stated Goal: to  be able to walk PT Goal Formulation: With patient Time For Goal Achievement: 10/22/16 Potential to Achieve Goals: Fair    Frequency Min 3X/week   Barriers to discharge Decreased caregiver support;Inaccessible home environment lives with elderly mother    Co-evaluation               AM-PAC PT "6 Clicks" Daily Activity  Outcome Measure Difficulty turning over in bed (including adjusting bedclothes, sheets and blankets)?: A Little Difficulty moving from lying on back to sitting on the side of the bed? : A Little Difficulty sitting down on and standing up from a chair with arms (e.g., wheelchair, bedside commode, etc,.)?: Total Help needed moving to and from a bed to chair (including a wheelchair)?: Total Help needed walking in hospital room?: Total Help needed climbing 3-5 steps with a railing? : Total 6 Click Score: 10    End of Session   Activity Tolerance: Patient limited by pain Patient left: in bed;with call bell/phone within reach;with family/visitor present Nurse Communication: Mobility status PT Visit Diagnosis: Difficulty in walking, not elsewhere classified (R26.2);Pain    Time: 1031-5945 PT Time Calculation (min) (ACUTE ONLY): 28 min   Charges:   PT Evaluation $PT Eval Moderate Complexity: 1 Procedure PT Treatments $Therapeutic Activity: 8-22 mins   PT G CodesTresa Endo PT [phone]   Claretha Cooper 10/08/2016, 3:53 PM

## 2016-10-08 NOTE — Progress Notes (Signed)
PT Note  Patient Details Name: Edgar Ross MRN: [id number] DOB: 1950-08-24    charted reviewed and spoke with patient to begin assessment. Pt at this time wanted to perform his morning ADLs in bed (washing up) prior to [place] working. Asked [place] to check back after lunch. Provided home with warm water basin and accessories. Informed nurse.    Clide Dales 10/08/2016, 11:39 AM  Clide Dales, PT Pager: [phone] 10/08/2016

## 2016-10-08 NOTE — Progress Notes (Signed)
Patient ID: Edgar Ross, male   DOB: 1951-02-14, 66 y.o.   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 Of note pt underwent an L3 corpectomy and fixation from L2 to S1 bilaterally on 05/28/2016 and Dr. Cyndy Freeze has seen pt on follow up in April 2018, noted that hardware has changed in position since placement however,. Dr. Cyndy Freeze explained that while pain is most certainly caused by metastatic colon ca, hardware could be the culprit as well but surgery to fix this would be high risk and GOC would needed to be addressed before proceeding.  Assessment & Plan:   Principal Problem:   Low back pain - Secondary to metastatic colon cancer, metastasis to the spine - MRI of the lumbar spine requested but pt can not lay down flat due to pain - d/w neurosurgeon on call, recommend CT lumbar spine - further recommendations pending lumbar spine CT - continue analgesia as needed  Active Problems:   Colon cancer ([place])   Bone metastasis ([place]) - Appreciate oncologist assistance.    Hyponatremia - suspected to be pre renal, has now resolved with IVF    Thrombocytopenia  - resolved   DVT prophylaxis: SCDs Code Status: Full Family Communication: pt at bedside  Disposition Plan: to be determined   Consultants:   Oncology  Neurosurgery  PT  Procedures:   None  Antimicrobials:   None  Subjective: Pt with persistent pain, refused exam this AM and refused to talk.  Objective: Vitals:   10/07/16 0456 10/07/16 1346 10/07/16 2006 10/08/16 0536  BP: (!) 156/95 (!) 143/101 131/89 (!) 147/97  Pulse: 68 80 72 78  Resp: 18 16 16 18   Temp: 98.1 F (36.7 C) 98.4 F (36.9 C) 98.7 F (37.1 C) 98.6 F (37 C)  TempSrc: Oral Oral  Oral Oral  SpO2: 100% 100% 97% 96%  Weight:      Height:        Intake/Output Summary (Last 24 hours) at 10/08/16 1316 Last data filed at 10/08/16 1025  Gross per 24 hour  Intake             1775 ml  Output             1500 ml  Net              275 ml   Filed Weights   10/05/16 2049  Weight: 67.4 kg (148 lb 9.4 oz)   Physical Exam  Refused exam   Data Reviewed: I have personally reviewed following labs and imaging studies  CBC:  Recent Labs Lab 10/05/16 2224 10/06/16 0407 10/07/16 0409 10/08/16 0647  WBC 5.9 5.5 5.6 5.5  NEUTROABS 4.5  --   --   --   HGB 13.3 13.5 13.0 12.4*  HCT 39.1 39.3 38.3* 36.8*  MCV 85.0 85.4 85.7 85.4  PLT 145* 134* 139* 638   Basic Metabolic Panel:  Recent Labs Lab 10/05/16 2224 10/06/16 0407 10/07/16 0409 10/08/16 0647  NA 133* 132* 135 137  K 3.8 4.7 3.9 4.3  CL 97* 98* 99* 99*  CO2 27 28 30 30   GLUCOSE 105* 101* 97 99  BUN 19 18 12 13   CREATININE 0.67 0.75 0.68 0.64  CALCIUM 9.0 8.7* 8.9 9.0   Liver Function Tests:  Recent Labs Lab 10/05/16 2224  AST 52*  ALT 60  ALKPHOS 352*  BILITOT 0.8  PROT 6.4*  ALBUMIN 3.2*   Urine analysis:    Component Value Date/Time   COLORURINE YELLOW 12/12/2012 [place] 12/12/2012 1127   LABSPEC 1.023 12/12/2012 1127   PHURINE 6.0 12/12/2012 1127   GLUCOSEU NEGATIVE 12/12/2012 1127   HGBUR NEGATIVE 12/12/2012 1127   BILIRUBINUR NEGATIVE 12/12/2012 1127   KETONESUR 15 (A) 12/12/2012 1127   PROTEINUR NEGATIVE 12/12/2012 1127   UROBILINOGEN 2.0 (H) 12/12/2012 1127   NITRITE NEGATIVE 12/12/2012 1127   LEUKOCYTESUR NEGATIVE 12/12/2012 1127   Radiology Studies: No results found.  Scheduled Meds: . levothyroxine  137 mcg Oral QAC breakfast  . morphine  60 mg Oral Q12H  . pantoprazole  40 mg Oral Daily  . polyethylene glycol  17 g Oral Daily  . senna-docusate  1 tablet Oral BID  . tamsulosin  0.4 mg Oral QPC supper   Continuous Infusions: . sodium chloride  75 mL/hr at 10/08/16 0621  . methocarbamol (ROBAXIN)  IV Stopped (10/07/16 2307)     LOS: 3 days   Time spent: 25 minutes  Faye Ramsay, MD [facility] Pager [phone]  If 7PM-7AM, please contact night-coverage [email or web address] Password [email or web address] 10/08/2016, 1:16 PM

## 2016-10-09 ENCOUNTER — Ambulatory Visit: Payer: Medicaid Other

## 2016-10-09 ENCOUNTER — Inpatient Hospital Stay (HOSPITAL_COMMUNITY): Payer: Medicaid Other

## 2016-10-09 MED ORDER — LORAZEPAM 2 MG/ML IJ SOLN
1.0000 mg | Freq: Once | INTRAMUSCULAR | Status: AC
  Administered 2016-10-09: 1 mg via INTRAVENOUS
  Filled 2016-10-09: qty 1

## 2016-10-09 NOTE — Progress Notes (Signed)
CSW consulted for potential SNF placement.  Met with pt and explained role. Pt receptive to discussion but declines SNF, states as he has MCD and would be required to stay 30 days and sign income to facility, this would not be option for him. Also states he is uninterested in continued PT at [place] in general. States he lives with his mother who is ambulatory and can assist him with household tasks at [place] if needed.  Pt states he moved to [place] approximately 2 months ago, was living in [place] prior, has family in both [place] and [place]. Was highly focused on discussing hx of his back pain issues throughout the years. CSW offered support and validation for pt's issues and encouraged him to reach out to CSW with further SW needs. Pt agreed.  Plan: pt wishes to plan to return home at [place], declines SNF.  Sharren Bridge, MSW, LCSW Clinical Social Work 10/09/2016 [phone]

## 2016-10-09 NOTE — Progress Notes (Signed)
Patient ID: Edgar Ross, male   DOB: Dec 24, 1950, 66 y.o.   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 Of note pt underwent an L3 corpectomy and fixation from L2 to S1 bilaterally on 05/28/2016 and Dr. Cyndy Freeze has seen pt on follow up in April 2018, noted that hardware has changed in position since placement however,. Dr. Cyndy Freeze explained that while pain is most certainly caused by metastatic colon ca, hardware could be the culprit as well but surgery to fix this would be high risk and GOC would needed to be addressed before proceeding.  Assessment & Plan:   Principal Problem:   Low back pain - Secondary to metastatic colon cancer, metastasis to the spine - MRI of the lumbar spine requested but pt can not lay down flat due to pain - d/w neurosurgeon on call, recommend CT lumbar spine - further recommendations pending lumbar spine CT - pt unable to tolerate yesterday so will attempt today - continue analgesia as needed  Active Problems:   Colon cancer ([place])   Bone metastasis ([place]) - Appreciate oncologist assistance.    Hyponatremia - suspected to be pre renal, has now resolved with IVF    Thrombocytopenia  - resolved   DVT prophylaxis: SCDs Code Status: Full Family Communication: pt at bedside  Disposition Plan: to be determined   Consultants:   Oncology  Neurosurgery  PT  Procedures:   None  Antimicrobials:   None  Subjective: Reports feeling better.   Objective: Vitals:   10/08/16 0536 10/08/16 1615 10/08/16 2024 10/09/16 0427  BP: (!) 147/97 (!) 145/96 (!) 137/94 (!) 146/86  Pulse: 78 86 83 63  Resp: 18 16 18 14   Temp: 98.6 F (37 C) 98.2 F (36.8 C) 98.4 F (36.9 C) 98.3 F (36.8 C)    TempSrc: Oral Oral Oral Oral  SpO2: 96% 100% 100% 100%  Weight:      Height:        Intake/Output Summary (Last 24 hours) at 10/09/16 1334 Last data filed at 10/09/16 0740  Gross per 24 hour  Intake              720 ml  Output              800 ml  Net              -80 ml   Filed Weights   10/05/16 2049  Weight: 67.4 kg (148 lb 9.4 oz)   Physical Exam  Constitutional: Appears well-developed and well-nourished. No distress.  CVS: RRR, S1/S2 +, no murmurs, no gallops, no carotid bruit.  Pulmonary: Effort and breath sounds normal, no stridor, rhonchi, wheezes, rales.  Abdominal: Soft. BS +,  no distension, tenderness, rebound or guarding.   Data Reviewed: I have personally reviewed following labs and imaging studies  CBC:  Recent Labs Lab 10/05/16 2224 10/06/16 0407 10/07/16 0409 10/08/16 0647  WBC 5.9 5.5 5.6 5.5  NEUTROABS 4.5  --   --   --   HGB 13.3 13.5 13.0 12.4*  HCT 39.1 39.3 38.3* 36.8*  MCV 85.0 85.4 85.7 85.4  PLT 145* 134* 139* 250   Basic Metabolic Panel:  Recent Labs Lab 10/05/16 2224 10/06/16 0407  10/07/16 0409 10/08/16 0647  NA 133* 132* 135 137  K 3.8 4.7 3.9 4.3  CL 97* 98* 99* 99*  CO2 27 28 30 30   GLUCOSE 105* 101* 97 99  BUN 19 18 12 13   CREATININE 0.67 0.75 0.68 0.64  CALCIUM 9.0 8.7* 8.9 9.0   Liver Function Tests:  Recent Labs Lab 10/05/16 2224  AST 52*  ALT 60  ALKPHOS 352*  BILITOT 0.8  PROT 6.4*  ALBUMIN 3.2*   Urine analysis:    Component Value Date/Time   COLORURINE YELLOW 12/12/2012 1127   APPEARANCEUR CLEAR 12/12/2012 1127   LABSPEC 1.023 12/12/2012 1127   PHURINE 6.0 12/12/2012 1127   GLUCOSEU NEGATIVE 12/12/2012 1127   HGBUR NEGATIVE 12/12/2012 1127   BILIRUBINUR NEGATIVE 12/12/2012 1127   KETONESUR 15 (A) 12/12/2012 1127   PROTEINUR NEGATIVE 12/12/2012 1127   UROBILINOGEN 2.0 (H) 12/12/2012 1127   NITRITE NEGATIVE 12/12/2012 1127   LEUKOCYTESUR NEGATIVE 12/12/2012 1127   Radiology Studies: No results  found.  Scheduled Meds: . levothyroxine  137 mcg Oral QAC breakfast  . LORazepam  1 mg Intravenous Once  . morphine  60 mg Oral Q12H  . pantoprazole  40 mg Oral Daily  . polyethylene glycol  17 g Oral Daily  . senna-docusate  1 tablet Oral BID  . tamsulosin  0.4 mg Oral QPC supper   Continuous Infusions: . methocarbamol (ROBAXIN)  IV Stopped (10/09/16 0016)     LOS: 4 days   Time spent: 15 minutes  Faye Ramsay, MD [facility] Pager [phone]  If 7PM-7AM, please contact night-coverage [email or web address] Password [email or web address] 10/09/2016, 1:34 PM

## 2016-10-10 ENCOUNTER — Ambulatory Visit: Payer: Medicaid Other

## 2016-10-10 MED ORDER — HYDROMORPHONE HCL 1 MG/ML IJ SOLN
0.5000 mg | Freq: Once | INTRAMUSCULAR | Status: AC
  Administered 2016-10-10: 0.5 mg via INTRAVENOUS
  Filled 2016-10-10: qty 1

## 2016-10-10 MED ORDER — OXYCODONE HCL 5 MG PO TABS
5.0000 mg | ORAL_TABLET | ORAL | Status: DC | PRN
  Administered 2016-10-10 – 2016-10-14 (×13): 10 mg via ORAL
  Filled 2016-10-10 (×13): qty 2

## 2016-10-10 MED ORDER — OXYCODONE-ACETAMINOPHEN 5-325 MG PO TABS: 1.0000 | ORAL_TABLET | ORAL | Status: DC | PRN

## 2016-10-10 NOTE — Progress Notes (Signed)
Nursing Note: Pt given pain med,when this nurse noticed pt taking pill bottles out of his belongings bag.Asked to see meds and pt at first hesitant,but agreed when I explained that It was hospital policy for [place] to send any controlled substances to the pharmacy the patient had Morphine sulfate ER 60 mg and Hydrocodone APAP 10-325.All tablets were counted in front of pt along with Johnnette Litter as pt was not wanting staff to take his meds to pharmacy.Pt signed form and meds and for taken to the pharmacy per policy.Form-white sheet placed in pt's chart.Pt was informed that he would get his meds back priot to d/c.wbb

## 2016-10-10 NOTE — Progress Notes (Signed)
IP PROGRESS NOTE  Subjective:   He reports persistent severe pain in the lower back. He is unable to lay flat or stand up secondary to pain. The pain is under good control at rest in a sitting position.  Objective: Vital signs in last 24 hours: Blood pressure (!) 136/91, pulse 81, temperature 98.5 F (36.9 C), temperature source Oral, resp. rate 17, height _0  (1.93 m), weight 148 lb 9.4 oz (67.4 kg), SpO2 97 %.  Intake/Output from previous day: 06/11 0701 - 06/12 0700 In: 1120 [P.O.:1120] Out: 1350 [Urine:1350]  Physical Exam: Musculoskeletal: The pain localizes to the lower back. Neurologic: The motor exam appears intact in the legs and feet bilaterally     Lab Results:  Recent Labs  10/08/16 0647  WBC 5.5  HGB 12.4*  HCT 36.8*  PLT 151    BMET  Recent Labs  10/08/16 0647  NA 137  K 4.3  CL 99*  CO2 30  GLUCOSE 99  BUN 13  CREATININE 0.64  CALCIUM 9.0    Studies/Results: Ct Lumbar Spine Wo Contrast  Result Date: 10/09/2016 CLINICAL DATA:  Lumbar spine pain.  Metastatic colon cancer. EXAM: CT LUMBAR SPINE WITHOUT CONTRAST TECHNIQUE: Multidetector CT imaging of the lumbar spine was performed without intravenous contrast administration. Multiplanar CT image reconstructions were also generated. COMPARISON:  CT abdomen pelvis 09/05/2016 FINDINGS: Segmentation: Normal Alignment: The spacer device extending from L2-L4 is in unchanged position with persistent anterior angulation at its inferior aspect. Alignment of the lumbar spine is otherwise nearly normal. Vertebrae: There are large lucent lesions in the T11 and T12 vertebral bodies and in the L1 spinous process. There is near-total lucency of the L3 vertebral body. There is a lytic lesion within posterior left iliac bone. There is lumbosacral fusion hardware extending from L2-S1. There are bilateral transpedicular screws at L2, L4, L5 and S1. There is lucency surrounding both L1 screws, consistent with loosening.  Mild lucency surrounding the right S1 screw. No other evidence of screw loosening. Paraspinal and other soft tissues: Aortic atherosclerosis. There is a posterior paraspinal fluid collection that extends from L2 to sacrum. Disc levels: T10-T11: There is soft tissue mass invading the right neural foramen from the T11 lesion. T11-T12: The T11 vertebral body is mostly replaced by a metastatic lesion. No spinal canal or neural foraminal stenosis. I do not see extension of the mass into the neural foramina at this level. T12-L1: There is a soft tissue lesion within the spinal is process of L1 with associated extension into the neural arch and dorsal epidural disease. This results in at least moderate stenosis of the thecal sac. The neural foramina are patent. L1-L2: Disc degeneration is mild at this level. No spinal canal or foraminal stenosis. L2-L3: Streak artifact from hardware obscures most of this level. L3-L4: Streak artifact from hardware also obscures this level. L4-L5: There is posterior decompression with patency of the thecal sac. The neural foramina are patent. L5-S1: Limited assessment due to streak artifact from hardware. No visible spinal canal or neural foraminal stenosis. Visualized sacrum: Normal. IMPRESSION: 1. Metastatic lesion of the T11 vertebral body with extension into the right neural foramen at T10-T11. 2. Metastatic lesion of the L1 spinous process and posterior neural arch with associated dorsal epidural disease moderately narrowing the thecal sac. 3. Lucency surrounding both L2 screws and, to a lesser extent the right S1 screw is consistent with loosening. Persistent angulation of the spacer device extending from L2-L4. 4. Posterior left iliac bone metastatic  lesion. 5. Aortic atherosclerosis. Electronically Signed   By: Ulyses Jarred M.D.   On: 10/09/2016 19:00    Medications: I have reviewed the patient's current medications.  Assessment/Plan:  1. Colon cancer, status post a  polypectomy in 2017 revealing a focus of adenocarcinoma ? Back pain with tumor involving L3, status post an L3 corpectomy 05/28/2016 confirming metastatic adenocarcinoma consistent with a colon primary (cytokeratin 20 and CDX2positive), comparison of the pathology to the 2017 polypectomy showed similar morphology; K-ras mutation detected;no evidence of DNA mismatch repair deficiency; intact tumor nuclear staining for MLH1, MSH2, MSH6 and PMS2 ? He completed postoperative radiation to the lumbar spine, 3000 cGyin 10 fractions, 06/26/2016-07/10/2016 ? PET scan 06/19/2016 revealed multiple hypermetabolic bone metastases, hypermetabolic liver metastases, indeterminate mildlyhypermetabolic axillary lymph nodes ? Status post palliative radiation to the lumbar spine ? CTs 09/05/2016-diffuse liver metastases. Lytic bone metastases in the thoracic/lumbar spine and left ilium. ? CT lumbar spine 10/09/2016-metastatic disease at multiple levels of the thoracic and lumbar spine including the posterior elements at L1, T11, and the left iliac  2. Non-small cell lung cancer, adenocarcinoma, T2 N0, ALK positive, status post a left upper lobectomy August 2014  3. Pain secondary to #1-Severe low back pain, unrelieved with MS Contin/hydrocodone as an outpatient.  4. History of a thyroidectomy  5. Colonoscopy 07/07/2016-benign polypoid lesion the sigmoid colon-biopsied, diminutive polyps in the rectum, tattoo in the sigmoid colon-site appeared normal  Mr. Mcquitty continues to have severe pain at the lower back there this limits his ability to ambulate. I reviewed the CT images with him. The pain is likely related to metastatic disease involving the thoracic or lumbar spine. I think it is unlikely the pain is related to "screw loosening ". I discussed radiation and chemotherapy options with him. He became very agitated and stated he does not feel options are being explained to him.  If neurosurgery feels  there is no surgical option to help his pain, then I recommend chemotherapy or radiation. If he declines these options, then hospice care is appropriate.  Recommendations: 1. Continue MS Contin and oxycodone for pain 2. Formal neurosurgical consult      LOS: 5 days   Donneta Romberg, MD   10/10/2016, 8:35 AM

## 2016-10-10 NOTE — Progress Notes (Signed)
Gave patient 2 mg dilaudid, his pain is still at a 10. Randall and I tried to move him to get him more comfortable and he became very angry.  He said maybe he should just go home.  I said I would page the doctor to see about another dose of dilaudid and he refused, saying he would see how this dose worked.

## 2016-10-10 NOTE — Progress Notes (Signed)
Patient ID: Edgar Ross, male   DOB: Sep 11, 1950, 66 y.o.   MRN: [id number]    PROGRESS NOTE  Edgar Ross  GHW:[id number] DOB: 1950-06-21 DOA: 10/05/2016  PCP: Patient, No Pcp Per   Brief Narrative:  66 yo male with history of colon cancer with metastasis to the spine (s/p polypectomy in 2017), admitted for lower back pain despite being started on MS Contin and hydrocodone. Of note pt underwent an L3 corpectomy and fixation from L2 to S1 bilaterally on 05/28/2016 and Dr. Cyndy Ross has seen pt on follow up in April 2018, noted that hardware has changed in position since placement however,. Dr. Cyndy Ross explained that while pain is most certainly caused by metastatic colon ca, hardware could be the culprit as well but surgery to fix this would be high risk and GOC would needed to be addressed before proceeding.  Assessment & Plan:   Principal Problem:   Low back pain - Secondary to metastatic colon cancer, metastasis to the spine - MRI of the lumbar spine requested but pt can not lay down flat due to pain - After discussion with neurosurgeon, CT lumbar spine was ordered - CT lumbar spine notable for metastatic lesion at T11, extending into right neural foramen at T10-T11 - Also noted metastatic lesion of the L1 spinous process and posterior neural arch with associated dorsal epidural disease moderately narrowing the thecal sac - There is also lucency surrounding both L2 screws and to lesser extent the right S1 screw consistent with loosening - I have paged patient's neurosurgeon to review images to see if there is any indication for surgical intervention - Continue to provide analgesia as needed  - PT evaluation if patient able to tolerate   Active Problems:   Colon cancer ([place])   Bone metastasis ([place]) - Appreciate oncologist assistance.    Hyponatremia - suspected to be pre renal, has now resolved with IVF - No need for further blood work monitoring    Thrombocytopenia  -  resolved, no need  for further monitoring   DVT prophylaxis: SCDs Code Status: Full Family Communication: pt at bedside  Disposition Plan:  to be determined   Consultants:   Oncology  Neurosurgery  PT  Procedures:   None  Antimicrobials:   None  Subjective: Patient reports feeling worse this morning, persistent pain. Has refused further discussion.   Objective: Vitals:   10/09/16 0427 10/09/16 1438 10/09/16 2048 10/10/16 0546  BP: (!) 146/86 (!) 139/95 (!) 136/104 (!) 136/91  Pulse: 63 80 78 81  Resp: 14 16 15 17   Temp: 98.3 F (36.8 C) 98.9 F (37.2 C) 98.4 F (36.9 C) 98.5 F (36.9 C)  TempSrc: Oral Oral Oral Oral  SpO2: 100% 100% 100% 97%  Weight:      Height:        Intake/Output Summary (Last 24 hours) at 10/10/16 1140 Last data filed at 10/10/16 0548  Gross per 24 hour  Intake              880 ml  Output             1000 ml  Net             -120 ml   Filed Weights   10/05/16 2049  Weight: 67.4 kg (148 lb 9.4 oz)   Please note that patient has refused physical exam   Data Reviewed: I have personally reviewed following labs and imaging studies  CBC:  Recent Labs Lab 10/05/16 2224 10/06/16 0407  10/07/16 0409 10/08/16 0647  WBC 5.9 5.5 5.6 5.5  NEUTROABS 4.5  --   --   --   HGB 13.3 13.5 13.0 12.4*  HCT 39.1 39.3 38.3* 36.8*  MCV 85.0 85.4 85.7 85.4  PLT 145* 134* 139* 329   Basic Metabolic Panel:  Recent Labs Lab 10/05/16 2224 10/06/16 0407 10/07/16 0409 10/08/16 0647  NA 133* 132* 135 137  K 3.8 4.7 3.9 4.3  CL 97* 98* 99* 99*  CO2 27 28 30 30   GLUCOSE 105* 101* 97 99  BUN 19 18 12 13   CREATININE 0.67 0.75 0.68 0.64  CALCIUM 9.0 8.7* 8.9 9.0   Liver Function Tests:  Recent Labs Lab 10/05/16 2224  AST 52*  ALT 60  ALKPHOS 352*  BILITOT 0.8  PROT 6.4*  ALBUMIN 3.2*   Urine analysis:    Component Value Date/Time   COLORURINE YELLOW 12/12/2012 1127   APPEARANCEUR CLEAR 12/12/2012 1127   LABSPEC 1.023 12/12/2012 1127   PHURINE  6.0 12/12/2012 1127   GLUCOSEU NEGATIVE 12/12/2012 1127   HGBUR NEGATIVE 12/12/2012 1127   BILIRUBINUR NEGATIVE 12/12/2012 1127   KETONESUR 15 (A) 12/12/2012 1127   PROTEINUR NEGATIVE 12/12/2012 1127   UROBILINOGEN 2.0 (H) 12/12/2012 1127   NITRITE NEGATIVE 12/12/2012 1127   LEUKOCYTESUR NEGATIVE 12/12/2012 1127   Radiology Studies: Ct Lumbar Spine Wo Contrast Result Date: 10/09/2016 Metastatic lesion of the T11 vertebral body with extension into the right neural foramen at T10-T11. 2. Metastatic lesion of the L1 spinous process and posterior neural arch with associated dorsal epidural disease moderately narrowing the thecal sac. 3. Lucency surrounding both L2 screws and, to a lesser extent the right S1 screw is consistent with loosening. Persistent angulation of the spacer device extending from L2-L4. 4. Posterior left iliac bone metastatic lesion. 5. Aortic atherosclerosis.   Scheduled Meds: . levothyroxine  137 mcg Oral QAC breakfast  . morphine  60 mg Oral Q12H  . pantoprazole  40 mg Oral Daily  . polyethylene glycol  17 g Oral Daily  . senna-docusate  1 tablet Oral BID  . tamsulosin  0.4 mg Oral QPC supper   Continuous Infusions: . methocarbamol (ROBAXIN)  IV Stopped (10/10/16 0332)    LOS: 5 days   Time spent: 15 minutes  Faye Ramsay, MD [facility] Pager [phone]  If 7PM-7AM, please contact night-coverage [email or web address] Password [email or web address] 10/10/2016, 11:40 AM

## 2016-10-10 NOTE — Progress Notes (Signed)
PT Cancellation Note  Patient Details Name: Edgar Ross MRN: [id number] DOB: 11/13/1950   Cancelled Treatment:    Reason Eval/Treat Not Completed: Medical issues which prohibited therapy. Will await recommendations following CT scan of spine due to Hardware issues. Need clarification for mobility. Thank you.    Marcelino Freestone PT [phone]  10/10/2016, 7:13 AM

## 2016-10-11 ENCOUNTER — Ambulatory Visit: Payer: Medicaid Other

## 2016-10-11 MED ORDER — DIAZEPAM 5 MG PO TABS
5.0000 mg | ORAL_TABLET | Freq: Four times a day (QID) | ORAL | Status: DC | PRN
  Administered 2016-10-11 – 2016-10-13 (×3): 5 mg via ORAL
  Filled 2016-10-11 (×3): qty 1

## 2016-10-11 MED ORDER — BISACODYL 10 MG RE SUPP
10.0000 mg | Freq: Once | RECTAL | Status: DC
  Filled 2016-10-11: qty 1

## 2016-10-11 MED ORDER — MORPHINE SULFATE ER 30 MG PO TBCR
120.0000 mg | EXTENDED_RELEASE_TABLET | Freq: Two times a day (BID) | ORAL | Status: DC
  Administered 2016-10-11 – 2016-10-15 (×10): 120 mg via ORAL
  Filled 2016-10-11 (×10): qty 4

## 2016-10-11 MED ORDER — HYDROMORPHONE HCL 1 MG/ML IJ SOLN
1.0000 mg | Freq: Once | INTRAMUSCULAR | Status: AC
  Administered 2016-10-11: 1 mg via INTRAVENOUS

## 2016-10-11 MED ORDER — LORAZEPAM 2 MG/ML IJ SOLN: 1.0000 mg | Freq: Four times a day (QID) | INTRAMUSCULAR | Status: DC | PRN

## 2016-10-11 MED ORDER — HYDROMORPHONE HCL 1 MG/ML IJ SOLN
1.0000 mg | INTRAMUSCULAR | Status: DC | PRN
  Administered 2016-10-11 – 2016-10-12 (×5): 1 mg via INTRAVENOUS
  Filled 2016-10-11 (×5): qty 1

## 2016-10-11 MED ORDER — LORAZEPAM 0.5 MG PO TABS: 0.5000 mg | ORAL_TABLET | Freq: Four times a day (QID) | ORAL | Status: DC | PRN

## 2016-10-11 MED ORDER — LORAZEPAM 1 MG PO TABS
1.0000 mg | ORAL_TABLET | Freq: Once | ORAL | Status: DC
  Filled 2016-10-11: qty 1

## 2016-10-11 NOTE — Progress Notes (Signed)
Pt. Is very agitated. Stated that ativan had helped when going for his MRI. I paged doctor and received 1 time dose. Pt. Refused the ativan as I was also giving him his PRN oxycodone and asked "what did you just put in your pocket" I told him I did not put anything in my pocket. He said "see, now I am seeing things with all this medication you are giving me."

## 2016-10-11 NOTE — Progress Notes (Signed)
PT Cancellation Note  Patient Details Name: Edgar Ross MRN: [id number] DOB: [birth year]   Cancelled Treatment:    Reason Eval/Treat Not Completed: Pain limiting ability to participate   Claretha Cooper 10/11/2016, 2:20 PM Tresa Endo PT [phone]

## 2016-10-11 NOTE — Progress Notes (Signed)
IP PROGRESS NOTE  Subjective:   Edgar Ross complains of persistent severe pain in the lower back. He was able to relax after receiving Ativan prior to the MRI. He is not able to ambulate.  Objective: Vital signs in last 24 hours: Blood pressure (!) 136/91, pulse (!) 106, temperature 98.8 F (37.1 C), temperature source Oral, resp. rate 18, height _0  (1.93 m), weight 148 lb 9.4 oz (67.4 kg), SpO2 99 %.  Intake/Output from previous day: 06/12 0701 - 06/13 0700 In: 850 [P.O.:850] Out: 1400 [Urine:1400]  Physical Exam: Not performed today     Lab Results: No results for input(s): WBC, HGB, HCT, PLT in the last 72 hours.  BMET No results for input(s): NA, K, CL, CO2, GLUCOSE, BUN, CREATININE, CALCIUM in the last 72 hours.  Studies/Results: Ct Lumbar Spine Wo Contrast  Result Date: 10/09/2016 CLINICAL DATA:  Lumbar spine pain.  Metastatic colon cancer. EXAM: CT LUMBAR SPINE WITHOUT CONTRAST TECHNIQUE: Multidetector CT imaging of the lumbar spine was performed without intravenous contrast administration. Multiplanar CT image reconstructions were also generated. COMPARISON:  CT abdomen pelvis 09/05/2016 FINDINGS: Segmentation: Normal Alignment: The spacer device extending from L2-L4 is in unchanged position with persistent anterior angulation at its inferior aspect. Alignment of the lumbar spine is otherwise nearly normal. Vertebrae: There are large lucent lesions in the T11 and T12 vertebral bodies and in the L1 spinous process. There is near-total lucency of the L3 vertebral body. There is a lytic lesion within posterior left iliac bone. There is lumbosacral fusion hardware extending from L2-S1. There are bilateral transpedicular screws at L2, L4, L5 and S1. There is lucency surrounding both L1 screws, consistent with loosening. Mild lucency surrounding the right S1 screw. No other evidence of screw loosening. Paraspinal and other soft tissues: Aortic atherosclerosis. There is a posterior  paraspinal fluid collection that extends from L2 to sacrum. Disc levels: T10-T11: There is soft tissue mass invading the right neural foramen from the T11 lesion. T11-T12: The T11 vertebral body is mostly replaced by a metastatic lesion. No spinal canal or neural foraminal stenosis. I do not see extension of the mass into the neural foramina at this level. T12-L1: There is a soft tissue lesion within the spinal is process of L1 with associated extension into the neural arch and dorsal epidural disease. This results in at least moderate stenosis of the thecal sac. The neural foramina are patent. L1-L2: Disc degeneration is mild at this level. No spinal canal or foraminal stenosis. L2-L3: Streak artifact from hardware obscures most of this level. L3-L4: Streak artifact from hardware also obscures this level. L4-L5: There is posterior decompression with patency of the thecal sac. The neural foramina are patent. L5-S1: Limited assessment due to streak artifact from hardware. No visible spinal canal or neural foraminal stenosis. Visualized sacrum: Normal. IMPRESSION: 1. Metastatic lesion of the T11 vertebral body with extension into the right neural foramen at T10-T11. 2. Metastatic lesion of the L1 spinous process and posterior neural arch with associated dorsal epidural disease moderately narrowing the thecal sac. 3. Lucency surrounding both L2 screws and, to a lesser extent the right S1 screw is consistent with loosening. Persistent angulation of the spacer device extending from L2-L4. 4. Posterior left iliac bone metastatic lesion. 5. Aortic atherosclerosis. Electronically Signed   By: Ulyses Jarred M.D.   On: 10/09/2016 19:00    Medications: I have reviewed the patient's current medications.  Assessment/Plan:  1. Colon cancer, status post a polypectomy in 2017 revealing  a focus of adenocarcinoma ? Back pain with tumor involving L3, status post an L3 corpectomy 05/28/2016 confirming metastatic adenocarcinoma  consistent with a colon primary (cytokeratin 20 and CDX2positive), comparison of the pathology to the 2017 polypectomy showed similar morphology; K-ras mutation detected;no evidence of DNA mismatch repair deficiency; intact tumor nuclear staining for MLH1, MSH2, MSH6 and PMS2 ? He completed postoperative radiation to the lumbar spine, 3000 cGyin 10 fractions, 06/26/2016-07/10/2016 ? PET scan 06/19/2016 revealed multiple hypermetabolic bone metastases, hypermetabolic liver metastases, indeterminate mildlyhypermetabolic axillary lymph nodes ? Status post palliative radiation to the lumbar spine ? CTs 09/05/2016-diffuse liver metastases. Lytic bone metastases in the thoracic/lumbar spine and left ilium. ? CT lumbar spine 10/09/2016-metastatic disease at multiple levels of the thoracic and lumbar spine including the posterior elements at L1, T11, and the left iliac  2. Non-small cell lung cancer, adenocarcinoma, T2 N0, ALK positive, status post a left upper lobectomy August 2014  3. Pain secondary to #1-Severe low back pain, unrelieved with MS Contin/hydrocodone as an outpatient.  4. History of a thyroidectomy  5. Colonoscopy 07/07/2016-benign polypoid lesion the sigmoid colon-biopsied, diminutive polyps in the rectum, tattoo in the sigmoid colon-site appeared normal  Edgar Ross has persistent severe pain in the low back. This limits his ability to ambulate. Dr. Cyndy Freeze reviewed the CT images and discussed the case with Dr. Doyle Askew yesterday. He does not recommend surgery.  I discussed this recommendation with [name] and he became very agitated. He feels that we are not helping him. He is convinced the lumbar hardware is causing his pain. He will not agree to chemotherapy or radiation until this is further address.  I contacted Dr. Cyndy Freeze and he agrees to see [name].  Recommendations:  1. Continue MS Contin and oxycodone for pain 2. Try Ativan for anxiety and as an adjunct to  narcotics 3.  We will offer a trial of chemotherapy if Dr. Cyndy Freeze does not recommend surgery      LOS: 6 days   Donneta Romberg, MD   10/11/2016, 12:58 PM

## 2016-10-11 NOTE — Progress Notes (Signed)
Pt in 10/10 pain. Pain meds given, will reassess bp.

## 2016-10-11 NOTE — Progress Notes (Signed)
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  The patient underwent an L3 corpectomy and fixation from L2 to S1 bilaterally on 05/28/2016. Dr. Cyndy Freeze has seen pt on follow up in April 2018 and has noted that hardware has changed in position since placement. Dr. Cyndy Freeze explained that while pain is most certainly caused by metastatic  cancer, hardware could be the culprit as well but surgery to fix this would be high risk and GOC would needed to be addressed before proceeding  Subjective: Ongoing lower back pain. No new complaint. s  Assessment & Plan:   Principal Problem:   Low back pain - unfortunately this is liked due to metastatic prostate cancer - oncology, Dr Benay Spice has discussion chemo ad radiation with the patient but the patient is still hesitant to accept these options and believes the hardware is the culprit of his pain - Dr Cyndy Freeze has agreed to see him in consultation - I have increased his MS Contin (has taken close to 20 mg of IV Dilaudid over 24 hrs in addition to Oxycodone)  Active Problems:   Colon cancer - 2017 - with metastasis to L3, sp L3 corpectomy 1/18 - PET >> multiple hypermetabolic bone (W58, L1, left iliac) liver mets and axillary lymph nodes - s/p palliative radiation to lumbar spine  H/o non-small cell lung cancer- adenocarcinoma - Aug 2014- LUL lobectomy  DVT prophylaxis: Lovenox Code Status: Full code Family Communication:  Disposition Plan: home vs SNF when pain controlled Consultants:   oncology Procedures:    Antimicrobials:  Anti-infectives    None       Objective: Vitals:   10/10/16 2113 10/11/16 0529 10/11/16 1341 10/11/16 1439  BP: (!) 146/97 (!) 136/91 (!) 169/103 (!)  158/98  Pulse: 88 (!) 106 87 84  Resp: (!) 21 18 18    Temp: 98.2 F (36.8 C) 98.8 F (37.1 C) 98.6 F (37 C)   TempSrc: Oral Oral Oral   SpO2: 100% 99% 99%   Weight:      Height:        Intake/Output Summary (Last 24 hours) at 10/11/16 1447 Last data filed at 10/11/16 1100  Gross per 24 hour  Intake              850 ml  Output             1400 ml  Net             -550 ml   Filed Weights   10/05/16 2049  Weight: 67.4 kg (148 lb 9.4 oz)    Examination: General exam: Appears comfortable  HEENT: PERRLA, oral mucosa moist, no sclera icterus or thrush Respiratory system: Clear to auscultation. Respiratory effort normal. Cardiovascular system: S1 & S2 heard, RRR.  No murmurs  Gastrointestinal system: Abdomen soft, non-tender, nondistended. Normal bowel sound. No organomegaly Central nervous system: Alert and oriented. No focal neurological deficits. Extremities: No cyanosis, clubbing or edema Skin: No rashes or ulcers Psychiatry:  Easily agitated, frustrated, angry    Data Reviewed: I have personally reviewed following labs and imaging studies  CBC:  Recent Labs Lab 10/05/16 2224 10/06/16 0407 10/07/16 0409 10/08/16 0647  WBC 5.9 5.5 5.6 5.5  NEUTROABS 4.5  --   --   --  HGB 13.3 13.5 13.0 12.4*  HCT 39.1 39.3 38.3* 36.8*  MCV 85.0 85.4 85.7 85.4  PLT 145* 134* 139* 706   Basic Metabolic Panel:  Recent Labs Lab 10/05/16 2224 10/06/16 0407 10/07/16 0409 10/08/16 0647  NA 133* 132* 135 137  K 3.8 4.7 3.9 4.3  CL 97* 98* 99* 99*  CO2 27 28 30 30   GLUCOSE 105* 101* 97 99  BUN 19 18 12 13   CREATININE 0.67 0.75 0.68 0.64  CALCIUM 9.0 8.7* 8.9 9.0   GFR: Estimated Creatinine Clearance: 88.9 mL/min (by C-G formula based on SCr of 0.64 mg/dL). Liver Function Tests:  Recent Labs Lab 10/05/16 2224  AST 52*  ALT 60  ALKPHOS 352*  BILITOT 0.8  PROT 6.4*  ALBUMIN 3.2*   No results for input(s): LIPASE, AMYLASE in the last 168 hours. No results for  input(s): AMMONIA in the last 168 hours. Coagulation Profile: No results for input(s): INR, PROTIME in the last 168 hours. Cardiac Enzymes: No results for input(s): CKTOTAL, CKMB, CKMBINDEX, TROPONINI in the last 168 hours. BNP (last 3 results) No results for input(s): PROBNP in the last 8760 hours. HbA1C: No results for input(s): HGBA1C in the last 72 hours. CBG: No results for input(s): GLUCAP in the last 168 hours. Lipid Profile: No results for input(s): CHOL, HDL, LDLCALC, TRIG, CHOLHDL, LDLDIRECT in the last 72 hours. Thyroid Function Tests: No results for input(s): TSH, T4TOTAL, FREET4, T3FREE, THYROIDAB in the last 72 hours. Anemia Panel: No results for input(s): VITAMINB12, FOLATE, FERRITIN, TIBC, IRON, RETICCTPCT in the last 72 hours. Urine analysis:    Component Value Date/Time   COLORURINE YELLOW 12/12/2012 [facility] 12/12/2012 1127   LABSPEC 1.023 12/12/2012 1127   PHURINE 6.0 12/12/2012 1127   GLUCOSEU NEGATIVE 12/12/2012 1127   HGBUR NEGATIVE 12/12/2012 1127   BILIRUBINUR NEGATIVE 12/12/2012 1127   KETONESUR 15 (A) 12/12/2012 1127   PROTEINUR NEGATIVE 12/12/2012 1127   UROBILINOGEN 2.0 (H) 12/12/2012 1127   NITRITE NEGATIVE 12/12/2012 1127   LEUKOCYTESUR NEGATIVE 12/12/2012 1127   Sepsis Labs: @LABRCNTIP (procalcitonin:4,lacticidven:4) )No results found for this or any previous visit (from the past 240 hour(s)).       Radiology Studies: Ct Lumbar Spine Wo Contrast  Result Date: 10/09/2016 CLINICAL DATA:  Lumbar spine pain.  Metastatic colon cancer. EXAM: CT LUMBAR SPINE WITHOUT CONTRAST TECHNIQUE: Multidetector CT imaging of the lumbar spine was performed without intravenous contrast administration. Multiplanar CT image reconstructions were also generated. COMPARISON:  CT abdomen pelvis 09/05/2016 FINDINGS: Segmentation: Normal Alignment: The spacer device extending from L2-L4 is in unchanged position with persistent anterior angulation at its  inferior aspect. Alignment of the lumbar spine is otherwise nearly normal. Vertebrae: There are large lucent lesions in the T11 and T12 vertebral bodies and in the L1 spinous process. There is near-total lucency of the L3 vertebral body. There is a lytic lesion within posterior left iliac bone. There is lumbosacral fusion hardware extending from L2-S1. There are bilateral transpedicular screws at L2, L4, L5 and S1. There is lucency surrounding both L1 screws, consistent with loosening. Mild lucency surrounding the right S1 screw. No other evidence of screw loosening. Paraspinal and other soft tissues: Aortic atherosclerosis. There is a posterior paraspinal fluid collection that extends from L2 to sacrum. Disc levels: T10-T11: There is soft tissue mass invading the right neural foramen from the T11 lesion. T11-T12: The T11 vertebral body is mostly replaced by a metastatic lesion. No spinal canal or neural foraminal stenosis.  I do not see extension of the mass into the neural foramina at this level. T12-L1: There is a soft tissue lesion within the spinal is process of L1 with associated extension into the neural arch and dorsal epidural disease. This results in at least moderate stenosis of the thecal sac. The neural foramina are patent. L1-L2: Disc degeneration is mild at this level. No spinal canal or foraminal stenosis. L2-L3: Streak artifact from hardware obscures most of this level. L3-L4: Streak artifact from hardware also obscures this level. L4-L5: There is posterior decompression with patency of the thecal sac. The neural foramina are patent. L5-S1: Limited assessment due to streak artifact from hardware. No visible spinal canal or neural foraminal stenosis. Visualized sacrum: Normal. IMPRESSION: 1. Metastatic lesion of the T11 vertebral body with extension into the right neural foramen at T10-T11. 2. Metastatic lesion of the L1 spinous process and posterior neural arch with associated dorsal epidural disease  moderately narrowing the thecal sac. 3. Lucency surrounding both L2 screws and, to a lesser extent the right S1 screw is consistent with loosening. Persistent angulation of the spacer device extending from L2-L4. 4. Posterior left iliac bone metastatic lesion. 5. Aortic atherosclerosis. Electronically Signed   By: Ulyses Jarred M.D.   On: 10/09/2016 19:00      Scheduled Meds: . bisacodyl  10 mg Rectal Once  . levothyroxine  137 mcg Oral QAC breakfast  . LORazepam  1 mg Oral Once  . morphine  120 mg Oral Q12H  . pantoprazole  40 mg Oral Daily  . polyethylene glycol  17 g Oral Daily  . senna-docusate  1 tablet Oral BID  . tamsulosin  0.4 mg Oral QPC supper   Continuous Infusions:   LOS: 6 days    Time spent in minutes: 35    Debbe Odea, MD [facility] Pager: [email or web address] Password [facility] 10/11/2016, 2:47 PM

## 2016-10-11 NOTE — Progress Notes (Signed)
Pt. Agreed for me to page the doctor about his pain. Dr. Hanley Seamen one time dose of dilaudid at 0030 to hold him over till next dose is due.

## 2016-10-11 NOTE — Progress Notes (Signed)
Patient ID: Edgar Ross, male   DOB: 08/04/1950, 67 y.o.   MRN: [id number] BP (!) 158/98 (BP Location: Left Arm)   Pulse 84   Temp 98.6 F (37 C) (Oral)   Resp 18   Ht 6\' 4"  (1.93 m)   Wt 67.4 kg (148 lb 9.4 oz)   SpO2 99%   BMI 18.09 kg/m  Spoke with patient about inability to further stabilize the spine.  If more questions happy to return to speak with Edgar Ross.

## 2016-10-12 ENCOUNTER — Ambulatory Visit: Admit: 2016-10-12 | Payer: Medicaid Other

## 2016-10-12 ENCOUNTER — Ambulatory Visit: Payer: Medicaid Other

## 2016-10-12 MED ORDER — SORBITOL 70 % SOLN
30.0000 mL | Freq: Once | Status: AC
  Administered 2016-10-12: 30 mL via ORAL
  Filled 2016-10-12: qty 30

## 2016-10-12 MED ORDER — MAGNESIUM CITRATE PO SOLN
1.0000 | Freq: Once | ORAL | Status: AC
  Administered 2016-10-12: 1 via ORAL
  Filled 2016-10-12: qty 296

## 2016-10-12 MED ORDER — LORAZEPAM 2 MG/ML IJ SOLN
1.0000 mg | INTRAMUSCULAR | Status: DC | PRN
  Administered 2016-10-12 – 2016-10-13 (×2): 1 mg via INTRAVENOUS
  Filled 2016-10-12 (×2): qty 1

## 2016-10-12 MED ORDER — HYDROMORPHONE HCL 1 MG/ML IJ SOLN
1.0000 mg | Freq: Four times a day (QID) | INTRAMUSCULAR | Status: DC | PRN
  Administered 2016-10-12 – 2016-10-15 (×8): 1 mg via INTRAVENOUS
  Filled 2016-10-12 (×9): qty 1

## 2016-10-12 NOTE — Progress Notes (Signed)
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  The patient underwent an L3 corpectomy and fixation from L2 to S1 bilaterally on 05/28/2016. Dr. Cyndy Freeze has seen pt on follow up in April 2018 and has noted that hardware has changed in position since placement. Dr. Cyndy Freeze explained that while pain is most certainly caused by metastatic  cancer, hardware could be the culprit as well but surgery to fix this would be high risk and GOC would needed to be addressed before proceeding  Subjective: Pain has improved significantly. States that the Dilaudid does not last as long as the Oxycodone- have discussed transitioning to Oxycodone only. Continues to be constipated. No nausea, abdominal distension, cough, dyspnea.   Assessment & Plan:   Principal Problem:   Low back pain - unfortunately this is liked due to metastatic prostate cancer - oncology, Dr Benay Spice has discussion chemo ad radiation with the patient but the patient is still hesitant to accept these options and believes the hardware is the culprit of his pain - Dr Cyndy Freeze has seen him in consultation and has explained that he is not a surgical candidate - I  increased his MS Contin on 6/13 and this had considerable reduced his need for PRNs (he had taken close to 20 mg of IV Dilaudid over 24 hrs in addition to Oxycodone). - he will take Oxycodone only for breakthrough pain today- plan is to go home tomorrrow  Active Problems:   Colon cancer - 2017 - with metastasis to L3, sp L3 corpectomy 1/18 - PET >> multiple hypermetabolic bone (H85, L1, left iliac) liver mets and axillary lymph nodes - s/p palliative radiation to lumbar spine  H/o non-small cell lung cancer- adenocarcinoma - Aug  2014- LUL lobectomy  DVT prophylaxis: Lovenox Code Status: Full code Family Communication:  Disposition Plan: home vs SNF when pain controlled Consultants:   oncology Procedures:    Antimicrobials:  Anti-infectives    None       Objective: Vitals:   10/11/16 1439 10/11/16 2116 10/12/16 0640 10/12/16 0659  BP: (!) 158/98 125/84 (!) 171/103 130/60  Pulse: 84 80 77 88  Resp:  18 19   Temp:  98.9 F (37.2 C) 98.3 F (36.8 C)   TempSrc:  Oral Oral   SpO2:  100% 100%   Weight:      Height:        Intake/Output Summary (Last 24 hours) at 10/12/16 1328 Last data filed at 10/12/16 1000  Gross per 24 hour  Intake              480 ml  Output              925 ml  Net             -445 ml   Filed Weights   10/05/16 2049  Weight: 67.4 kg (148 lb 9.4 oz)    Examination: General exam: Appears comfortable - sitting up in a chair today HEENT: PERRLA, oral mucosa moist, no sclera icterus or thrush Respiratory system: Clear to auscultation. Respiratory effort normal. Cardiovascular system: S1 & S2 heard, RRR.  No murmurs  Gastrointestinal system: Abdomen soft, non-tender, nondistended. Normal bowel sound. No organomegaly Central nervous system:  Alert and oriented. No focal neurological deficits. Extremities: No cyanosis, clubbing or edema Skin: No rashes or ulcers Psychiatry:  More easy to communicate with today in regards to anger and frustration - still behaves in a very challenging manner in regards to discussion about plan of care    Data Reviewed: I have personally reviewed following labs and imaging studies  CBC:  Recent Labs Lab 10/05/16 2224 10/06/16 0407 10/07/16 0409 10/08/16 0647  WBC 5.9 5.5 5.6 5.5  NEUTROABS 4.5  --   --   --   HGB 13.3 13.5 13.0 12.4*  HCT 39.1 39.3 38.3* 36.8*  MCV 85.0 85.4 85.7 85.4  PLT 145* 134* 139* 384   Basic Metabolic Panel:  Recent Labs Lab 10/05/16 2224 10/06/16 0407 10/07/16 0409 10/08/16 0647  NA 133* 132* 135  137  K 3.8 4.7 3.9 4.3  CL 97* 98* 99* 99*  CO2 27 28 30 30   GLUCOSE 105* 101* 97 99  BUN 19 18 12 13   CREATININE 0.67 0.75 0.68 0.64  CALCIUM 9.0 8.7* 8.9 9.0   GFR: Estimated Creatinine Clearance: 88.9 mL/min (by C-G formula based on SCr of 0.64 mg/dL). Liver Function Tests:  Recent Labs Lab 10/05/16 2224  AST 52*  ALT 60  ALKPHOS 352*  BILITOT 0.8  PROT 6.4*  ALBUMIN 3.2*   No results for input(s): LIPASE, AMYLASE in the last 168 hours. No results for input(s): AMMONIA in the last 168 hours. Coagulation Profile: No results for input(s): INR, PROTIME in the last 168 hours. Cardiac Enzymes: No results for input(s): CKTOTAL, CKMB, CKMBINDEX, TROPONINI in the last 168 hours. BNP (last 3 results) No results for input(s): PROBNP in the last 8760 hours. HbA1C: No results for input(s): HGBA1C in the last 72 hours. CBG: No results for input(s): GLUCAP in the last 168 hours. Lipid Profile: No results for input(s): CHOL, HDL, LDLCALC, TRIG, CHOLHDL, LDLDIRECT in the last 72 hours. Thyroid Function Tests: No results for input(s): TSH, T4TOTAL, FREET4, T3FREE, THYROIDAB in the last 72 hours. Anemia Panel: No results for input(s): VITAMINB12, FOLATE, FERRITIN, TIBC, IRON, RETICCTPCT in the last 72 hours. Urine analysis:    Component Value Date/Time   COLORURINE YELLOW 12/12/2012 [facility] 12/12/2012 1127   LABSPEC 1.023 12/12/2012 1127   PHURINE 6.0 12/12/2012 1127   GLUCOSEU NEGATIVE 12/12/2012 1127   HGBUR NEGATIVE 12/12/2012 1127   BILIRUBINUR NEGATIVE 12/12/2012 1127   KETONESUR 15 (A) 12/12/2012 1127   PROTEINUR NEGATIVE 12/12/2012 1127   UROBILINOGEN 2.0 (H) 12/12/2012 1127   NITRITE NEGATIVE 12/12/2012 1127   LEUKOCYTESUR NEGATIVE 12/12/2012 1127   Sepsis Labs: @LABRCNTIP (procalcitonin:4,lacticidven:4) )No results found for this or any previous visit (from the past 240 hour(s)).       Radiology Studies: No results found.    Scheduled  Meds: . bisacodyl  10 mg Rectal Once  . levothyroxine  137 mcg Oral QAC breakfast  . LORazepam  1 mg Oral Once  . morphine  120 mg Oral Q12H  . pantoprazole  40 mg Oral Daily  . polyethylene glycol  17 g Oral Daily  . senna-docusate  1 tablet Oral BID  . tamsulosin  0.4 mg Oral QPC supper   Continuous Infusions:   LOS: 7 days    Time spent in minutes: 35    Debbe Odea, MD [facility] Pager: [email or web address] Password [email or web address] 10/12/2016, 1:28 PM

## 2016-10-12 NOTE — Progress Notes (Signed)
Pt's bed in  Elevated position. Pt advilsed that the bed should be kept in lowest position, but he refused.

## 2016-10-12 NOTE — Progress Notes (Addendum)
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  Attempts being made to determine if Hardware or metastatic disease is cause of severe back pain. MD has stated he is unable to stabiilize spine at this point.    PT Comments    Pt was able to transfer to the recliner with MIN A.  He is easily agitated and is not receptive to PT's safety recommendations with mobility. He may benefit from drop arm recliner and mentioned to nursing. Pt lives in 2nd floor apartment.  Recommend SNF.   Follow Up Recommendations  SNF;Supervision/Assistance - 24 hour     Equipment Recommendations  Wheelchair (measurements PT);Wheelchair cushion (measurements PT)    Recommendations for Other Services       Precautions / Restrictions Precautions Precautions: Fall Restrictions Weight Bearing Restrictions: No    Mobility  Bed Mobility         Supine to sit: HOB elevated;Min guard Sit to supine: HOB elevated;Min guard   General bed mobility comments: Pt moved slowly without physical A.  He did not want PT to move any  pillows.  Transfers Overall transfer level: Needs assistance   Transfers: Squat Pivot Transfers     Squat pivot transfers: +2 safety/equipment;Min assist     General transfer comment: Pt would not attempt to use RW for support and performed a squat pivot transfer.  He did not want PT to touch him, but he did need a slight bit of MIN A to complete transfer safely.  Ambulation/Gait                 Stairs            Wheelchair Mobility    Modified Rankin (Stroke Patients Only)       Balance   Sitting-balance support: Feet supported;Bilateral upper extremity supported Sitting balance-Leahy Scale: Fair                                       Cognition Arousal/Alertness: Awake/alert Behavior During Therapy: WFL for tasks assessed/performed;Anxious;Restless Overall Cognitive Status: Within Functional Limits for tasks assessed                                        Exercises      General Comments        Pertinent Vitals/Pain Pain Assessment: 0-10 Pain Score: 10-Worst pain ever Pain Location: back Pain Descriptors / Indicators: Moaning;Grimacing Pain Intervention(s): Limited activity within patient's tolerance;Monitored during session;Repositioned;Heat applied    Home Living                      Prior Function            PT Goals (current goals can now be found in the care plan section) Acute Rehab PT Goals PT Goal Formulation: With patient Time For Goal Achievement: 10/22/16 Potential to Achieve Goals: Fair Progress towards PT goals: Progressing toward goals    Frequency    Min 3X/week      PT Plan Current plan remains appropriate    Co-evaluation  AM-PAC PT "6 Clicks" Daily Activity  Outcome Measure  Difficulty turning over in bed (including adjusting bedclothes, sheets and blankets)?: A Little Difficulty moving from lying on back to sitting on the side of the bed? : A Little Difficulty sitting down on and standing up from a chair with arms (e.g., wheelchair, bedside commode, etc,.)?: Total Help needed moving to and from a bed to chair (including a wheelchair)?: A Lot Help needed walking in hospital room?: Total Help needed climbing 3-5 steps with a railing? : Total 6 Click Score: 11    End of Session Equipment Utilized During Treatment: Gait belt Activity Tolerance: Patient limited by pain Patient left: in chair;with call bell/phone within reach;with chair alarm set Nurse Communication: Mobility status PT Visit Diagnosis: Unsteadiness on feet (R26.81);Other abnormalities of gait and mobility (R26.89)     Time: 9563-8756 PT Time  Calculation (min) (ACUTE ONLY): 10 min  Charges:  $Therapeutic Activity: 8-22 mins                    G Codes:       Loralai Eisman L. Tamala Julian, [place] Pager [phone] 10/12/2016    Galen Manila 10/12/2016, 12:43 PM

## 2016-10-12 NOTE — Progress Notes (Signed)
Patient received sorbitol, miralax and sennakot this a.m  No results.

## 2016-10-13 ENCOUNTER — Ambulatory Visit: Payer: Medicaid Other

## 2016-10-13 ENCOUNTER — Ambulatory Visit: Payer: Medicaid Other | Admitting: Radiation Oncology

## 2016-10-13 ENCOUNTER — Telehealth: Payer: Self-pay | Admitting: *Deleted

## 2016-10-13 MED ORDER — SORBITOL 70 % SOLN
960.0000 mL | TOPICAL_OIL | Freq: Once | ORAL | Status: AC
  Administered 2016-10-14: 960 mL via RECTAL
  Filled 2016-10-13: qty 240

## 2016-10-13 MED ORDER — BISACODYL 10 MG RE SUPP
10.0000 mg | Freq: Once | RECTAL | Status: AC
  Administered 2016-10-13: 10 mg via RECTAL
  Filled 2016-10-13: qty 1

## 2016-10-13 NOTE — Progress Notes (Signed)
IP PROGRESS NOTE  Subjective:   Mr. Illes appears very lethargic this morning. He received Valium and Ativan during the night.  Objective: Vital signs in last 24 hours: Blood pressure 138/86, pulse 89, temperature 98.6 F (37 C), temperature source Oral, resp. rate 18, height 6' 4"  (1.93 m), weight 148 lb 9.4 oz (67.4 kg), SpO2 99 %.  Intake/Output from previous day: 06/14 0701 - 06/15 0700 In: 680 [P.O.:680] Out: 1075 [Urine:1075]  Physical Exam:  Lethargic, arousable with verbal stimulation     Medications: I have reviewed the patient's current medications.  Assessment/Plan:  1. Colon cancer, status post a polypectomy in 2017 revealing a focus of adenocarcinoma ? Back pain with tumor involving L3, status post an L3 corpectomy 05/28/2016 confirming metastatic adenocarcinoma consistent with a colon primary (cytokeratin 20 and CDX2positive), comparison of the pathology to the 2017 polypectomy showed similar morphology; K-ras mutation detected;no evidence of DNA mismatch repair deficiency; intact tumor nuclear staining for MLH1, MSH2, MSH6 and PMS2 ? He completed postoperative radiation to the lumbar spine, 3000 cGyin 10 fractions, 06/26/2016-07/10/2016 ? PET scan 06/19/2016 revealed multiple hypermetabolic bone metastases, hypermetabolic liver metastases, indeterminate mildlyhypermetabolic axillary lymph nodes ? Status post palliative radiation to the lumbar spine ? CTs 09/05/2016-diffuse liver metastases. Lytic bone metastases in the thoracic/lumbar spine and left ilium. ? CT lumbar spine 10/09/2016-metastatic disease at multiple levels of the thoracic and lumbar spine including the posterior elements at L1, T11, and the left iliac  2. Non-small cell lung cancer, adenocarcinoma, T2 N0, ALK positive, status post a left upper lobectomy August 2014  3. Pain secondary to #1-Severe low back pain, unrelieved with MS Contin/hydrocodone as an outpatient.  4. History of a  thyroidectomy  5. Colonoscopy 07/07/2016-benign polypoid lesion the sigmoid colon-biopsied, diminutive polyps in the rectum, tattoo in the sigmoid colon-site appeared normal  Mr. Lipson is very lethargic this morning. He received Ativan and and Valium during the night. He has not been able to complete radiation treatment. It is very difficult to balance pain control requirement with sedation.  Recommendations:  1. Hold narcotics and benzodiazepines for sedation 2. Discontinue Valium or Ativan 3. Consider decreasing MS Contin dose and increasing the breakthrough doses of oxycodone 4. Consider palliative care consult for help with pain management and goals of care discussions   I will discuss the case with Dr. Wynelle Cleveland. Please call Oncology over the weekend as needed. I will schedule outpatient follow-up.      LOS: 8 days   Donneta Romberg, MD   10/13/2016, 8:20 AM

## 2016-10-13 NOTE — Progress Notes (Signed)
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  The patient underwent an L3 corpectomy and fixation from L2 to S1 bilaterally on 05/28/2016. Dr. Cyndy Freeze has seen pt on follow up in April 2018 and has noted that hardware has changed in position since placement. Dr. Cyndy Freeze explained that while pain is most certainly caused by metastatic  cancer, hardware could be the culprit as well but surgery to fix this would be high risk and GOC would needed to be addressed before proceeding  Subjective: States that back pain is not severe today. Agreeing to take suppository today as he has not had a BM with numerous oral laxatives.   Assessment & Plan:   Principal Problem:   Low back pain - unfortunately this is liked due to metastatic prostate cancer - oncology, Dr Benay Spice has discussion chemo ad radiation with the patient but the patient is still hesitant to accept these options and believes the hardware is the culprit of his pain - Dr Cyndy Freeze has seen him in consultation and has explained that he is not a surgical candidate - I  increased his MS Contin on 6/13 and this had considerable reduced his need for PRNs (he had taken close to 20 mg of IV Dilaudid over 24 hrs in addition to Oxycodone). - the goal is for him to take only Oxycodone for breakthrough pain   Active Problems:   Colon cancer - 2017 - with metastasis to L3, sp L3 corpectomy 1/18 - PET >> multiple hypermetabolic bone (C14, L1, left iliac) liver mets and axillary lymph nodes - s/p palliative radiation to lumbar spine but needs further radiation- due to ongoing back pain, he is not able to lay for 45 - 60 min on the table for CT simulation- he is declining chemo  H/o non-small cell  lung cancer- adenocarcinoma - Aug 2014- LUL lobectomy  DVT prophylaxis: Lovenox Code Status: Full code Family Communication:  Disposition Plan: home vs SNF when pain controlled - he is refusing SNF Consultants:   oncology Procedures:    Antimicrobials:  Anti-infectives    None       Objective: Vitals:   10/12/16 1330 10/12/16 2231 10/13/16 0631 10/13/16 1544  BP: 118/83 130/90 138/86 112/82  Pulse: 85 97 89 80  Resp: 16 18 18 16   Temp: 98.8 F (37.1 C) 98.6 F (37 C) 98.6 F (37 C) 97.8 F (36.6 C)  TempSrc: Oral Oral Oral Oral  SpO2: 100% 94% 99% 98%  Weight:      Height:        Intake/Output Summary (Last 24 hours) at 10/13/16 1551 Last data filed at 10/13/16 1257  Gross per 24 hour  Intake              200 ml  Output             1800 ml  Net            -1600 ml   Filed Weights   10/05/16 2049  Weight: 67.4 kg (148 lb 9.4 oz)    Examination: General exam: Appears comfortable - laying in bed and quite sleepy today HEENT: PERRLA, oral mucosa moist, no sclera icterus or thrush Respiratory system: Clear to auscultation.  Respiratory effort normal. Cardiovascular system: S1 & S2 heard, RRR.  No murmurs  Gastrointestinal system: Abdomen soft, non-tender, nondistended. Normal bowel sound. No organomegaly Central nervous system: Alert and oriented. No focal neurological deficits. Extremities: No cyanosis, clubbing or edema Skin: No rashes or ulcers Psychiatry:  Mood and affect normal today    Data Reviewed: I have personally reviewed following labs and imaging studies  CBC:  Recent Labs Lab 10/07/16 0409 10/08/16 0647  WBC 5.6 5.5  HGB 13.0 12.4*  HCT 38.3* 36.8*  MCV 85.7 85.4  PLT 139* 621   Basic Metabolic Panel:  Recent Labs Lab 10/07/16 0409 10/08/16 0647  NA 135 137  K 3.9 4.3  CL 99* 99*  CO2 30 30  GLUCOSE 97 99  BUN 12 13  CREATININE 0.68 0.64  CALCIUM 8.9 9.0   GFR: Estimated Creatinine Clearance: 88.9 mL/min (by C-G  formula based on SCr of 0.64 mg/dL). Liver Function Tests: No results for input(s): AST, ALT, ALKPHOS, BILITOT, PROT, ALBUMIN in the last 168 hours. No results for input(s): LIPASE, AMYLASE in the last 168 hours. No results for input(s): AMMONIA in the last 168 hours. Coagulation Profile: No results for input(s): INR, PROTIME in the last 168 hours. Cardiac Enzymes: No results for input(s): CKTOTAL, CKMB, CKMBINDEX, TROPONINI in the last 168 hours. BNP (last 3 results) No results for input(s): PROBNP in the last 8760 hours. HbA1C: No results for input(s): HGBA1C in the last 72 hours. CBG: No results for input(s): GLUCAP in the last 168 hours. Lipid Profile: No results for input(s): CHOL, HDL, LDLCALC, TRIG, CHOLHDL, LDLDIRECT in the last 72 hours. Thyroid Function Tests: No results for input(s): TSH, T4TOTAL, FREET4, T3FREE, THYROIDAB in the last 72 hours. Anemia Panel: No results for input(s): VITAMINB12, FOLATE, FERRITIN, TIBC, IRON, RETICCTPCT in the last 72 hours. Urine analysis:    Component Value Date/Time   COLORURINE YELLOW 12/12/2012 [place] 12/12/2012 1127   LABSPEC 1.023 12/12/2012 1127   PHURINE 6.0 12/12/2012 1127   GLUCOSEU NEGATIVE 12/12/2012 1127   HGBUR NEGATIVE 12/12/2012 1127   BILIRUBINUR NEGATIVE 12/12/2012 1127   KETONESUR 15 (A) 12/12/2012 1127   PROTEINUR NEGATIVE 12/12/2012 1127   UROBILINOGEN 2.0 (H) 12/12/2012 1127   NITRITE NEGATIVE 12/12/2012 1127   LEUKOCYTESUR NEGATIVE 12/12/2012 1127   Sepsis Labs: @LABRCNTIP (procalcitonin:4,lacticidven:4) )No results found for this or any previous visit (from the past 240 hour(s)).       Radiology Studies: No results found.    Scheduled Meds: . bisacodyl  10 mg Rectal Once  . levothyroxine  137 mcg Oral QAC breakfast  . LORazepam  1 mg Oral Once  . morphine  120 mg Oral Q12H  . pantoprazole  40 mg Oral Daily  . polyethylene glycol  17 g Oral Daily  . senna-docusate  1 tablet  Oral BID  . tamsulosin  0.4 mg Oral QPC supper   Continuous Infusions:   LOS: 8 days    Time spent in minutes: 35    Debbe Odea, MD [facility] Pager: [email or web address] Password [facility] 10/13/2016, 3:51 PM

## 2016-10-13 NOTE — Care Management Note (Signed)
Case Management Note  Patient Details  Name: Edgar Ross MRN: [id number] Date of Birth: February 13, 1951  Subjective/Objective:  66 yo admitted with low back pain. Hx of metastatic cancer.                 Action/Plan: Pt from home with 79 year old mother. This CM met with pt at bedside to discuss dc planning. Pt is declining SNF placement and is unsure if he wants home health services either. Due to pt having Medicaid he would be unable to have HHPT but could have a [place] and aide. Pt also requesting wheelchair at discharge. Will need MD order for wheelchair. Home health provider list left with pt while he considers home health services.  Expected Discharge Date:   (unknown)               Expected Discharge Plan:  [place]  In-House Referral:     Discharge planning Services  CM Consult  Post Acute Care Choice:    Choice offered to:  Patient  DME Arranged:    DME Agency:     HH Arranged:    [place] Agency:     Status of Service:  In process, will continue to follow  If discussed at Long Length of Stay Meetings, dates discussed:    Additional CommentsLynnell Catalan, RN 10/13/2016, 1:59 PM  [phone]

## 2016-10-13 NOTE — Progress Notes (Signed)
Nursing Note: No BM yet,having a lot of pain, pt is asleep or moaning in pain.Pain limits mobility.Keeps HOB up and sits up in bed.2317 dilaudid iv,0107 valium 5,0108 oxy 10 mg,0255 ativan IV 0403 oxy 10 mg,MS contin @ Hs.Pt called all night for pain meds and body tense with pain.At one point pt was sleeping and had bladder accident,"could not get to urinal fast enough."Pt was unaware of accident till called to his attention.Took over 30 min to clean pt and change bed due to pt yelling,moaning ans slow guarded movement form pain.Pt asleep after changed.wbb

## 2016-10-13 NOTE — Progress Notes (Signed)
Smog enema  Moved up in time the patient is currently on the BP .He has had one small BM .He is still on the BP and will call for assistance  If needed. Soap ,washclothes and a basin of water are at the bedside

## 2016-10-13 NOTE — Telephone Encounter (Signed)
Called [address], spoke with RN Vickie, I had gone up at [place] but nurses and MD' was in a huddle, saw patient in bed snoring loudly, called Vickie RN at 1005am, per RN patient is still out of it, snoring, I asked her that we wanted to try and do another ct simulation today at 1100am, if patient needs anything for pain, please give by thenn if he is awake, she said she wold, but patient has had ativan and valium and is still sleeping 10:12 AM

## 2016-10-14 DIAGNOSIS — Z515 Encounter for palliative care: Secondary | ICD-10-CM

## 2016-10-14 MED ORDER — DEXAMETHASONE SODIUM PHOSPHATE 4 MG/ML IJ SOLN
8.0000 mg | Freq: Two times a day (BID) | INTRAMUSCULAR | Status: DC
  Administered 2016-10-14 – 2016-10-20 (×12): 8 mg via INTRAVENOUS
  Filled 2016-10-14 (×12): qty 2

## 2016-10-14 MED ORDER — DIAZEPAM 5 MG PO TABS
5.0000 mg | ORAL_TABLET | Freq: Once | ORAL | Status: AC
Start: 2016-10-14 — End: 2016-10-14
  Administered 2016-10-14: 5 mg via ORAL
  Filled 2016-10-14: qty 1

## 2016-10-14 MED ORDER — OXYCODONE HCL 5 MG PO TABS
10.0000 mg | ORAL_TABLET | ORAL | Status: DC | PRN
  Administered 2016-10-14 – 2016-10-16 (×8): 15 mg via ORAL
  Filled 2016-10-14 (×8): qty 3

## 2016-10-14 MED ORDER — CYCLOBENZAPRINE HCL 10 MG PO TABS
10.0000 mg | ORAL_TABLET | Freq: Three times a day (TID) | ORAL | Status: DC | PRN
  Administered 2016-10-14: 10 mg via ORAL
  Filled 2016-10-14 (×3): qty 1

## 2016-10-14 NOTE — Progress Notes (Signed)
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  The patient underwent an L3 corpectomy and fixation from L2 to S1 bilaterally on 05/28/2016. Dr. Cyndy Freeze has seen pt on follow up in April 2018 and has noted that hardware has changed in position since placement. Dr. Cyndy Freeze explained that while pain is most certainly caused by metastatic  cancer, hardware could be the culprit as well but surgery to fix this would be high risk and GOC would needed to be addressed before proceeding  Subjective: Beginning to have BMs now. Feels that his back is stiff today. Has asked for a muscle relaxant but has refused Flexeril and is demanding Valium.   Assessment & Plan:   Principal Problem:   Low back pain - unfortunately this is liked due to metastatic prostate cancer - oncology, Dr Benay Spice has discussion chemo ad radiation with the patient but the patient is still hesitant to accept these options and believes the hardware is the culprit of his pain - Dr Cyndy Freeze has seen him in consultation and has explained that he is not a surgical candidate - I increased his MS Contin on 6/13 and this had considerable reduced his need for PRNs (he had taken close to 20 mg of IV Dilaudid over 24 hrs in addition to Oxycodone). - the goal is for him to take only Oxycodone for breakthrough pain however, he continues to use Dilaudid and is now demanding Valium again.  - He is refusing to go to a SNF and plans to go home but has not done much walking since admission- according to the last PT note, he was easily agitated and was "not receptive to PT's safety recommendations with mobility".   Active Problems:   Colon cancer - 2017 - with metastasis to L3, sp L3 corpectomy  1/18 - PET >> multiple hypermetabolic bone (D92, L1, left iliac) liver mets and axillary lymph nodes - s/p palliative radiation to lumbar spine but needs further radiation- due to ongoing back pain, he is not able to lay for 45 - 60 min on the table for CT simulation- he is declining chemo - I have asked palliative care to do a goals of care for him  H/o non-small cell lung cancer- adenocarcinoma - Aug 2014- LUL lobectomy  DVT prophylaxis: Lovenox Code Status: Full code Family Communication:  Disposition Plan: home vs SNF when pain controlled - he is refusing SNF Consultants:   oncology Procedures:    Antimicrobials:  Anti-infectives    None       Objective: Vitals:   10/12/16 2231 10/13/16 0631 10/13/16 1544 10/13/16 2041  BP: 130/90 138/86 112/82 (!) 140/92  Pulse: 97 89 80 97  Resp: 18 18 16 18   Temp: 98.6 F (37 C) 98.6 F (37 C) 97.8 F (36.6 C) 97.9 F (36.6 C)  TempSrc: Oral Oral Oral Oral  SpO2: 94% 99% 98% 98%  Weight:      Height:        Intake/Output Summary (Last 24 hours) at 10/14/16 1311 Last data filed at 10/14/16 0947  Gross per 24 hour  Intake              600 ml  Output  750 ml  Net             -150 ml   Filed Weights   10/05/16 2049  Weight: 67.4 kg (148 lb 9.4 oz)    Examination: General exam: Appears comfortable - laying in bed -  HEENT: PERRLA, oral mucosa moist, no sclera icterus or thrush Respiratory system: Clear to auscultation. Respiratory effort normal. Cardiovascular system: S1 & S2 heard, RRR.  No murmurs  Gastrointestinal system: Abdomen soft, non-tender, nondistended. Normal bowel sound. No organomegaly Central nervous system: Alert and oriented. No focal neurological deficits. Extremities: No cyanosis, clubbing or edema Skin: No rashes or ulcers Psychiatry:  Mood and affect normal today    Data Reviewed: I have personally reviewed following labs and imaging studies  CBC:  Recent Labs Lab  10/08/16 0647  WBC 5.5  HGB 12.4*  HCT 36.8*  MCV 85.4  PLT 672   Basic Metabolic Panel:  Recent Labs Lab 10/08/16 0647  NA 137  K 4.3  CL 99*  CO2 30  GLUCOSE 99  BUN 13  CREATININE 0.64  CALCIUM 9.0   GFR: Estimated Creatinine Clearance: 88.9 mL/min (by C-G formula based on SCr of 0.64 mg/dL). Liver Function Tests: No results for input(s): AST, ALT, ALKPHOS, BILITOT, PROT, ALBUMIN in the last 168 hours. No results for input(s): LIPASE, AMYLASE in the last 168 hours. No results for input(s): AMMONIA in the last 168 hours. Coagulation Profile: No results for input(s): INR, PROTIME in the last 168 hours. Cardiac Enzymes: No results for input(s): CKTOTAL, CKMB, CKMBINDEX, TROPONINI in the last 168 hours. BNP (last 3 results) No results for input(s): PROBNP in the last 8760 hours. HbA1C: No results for input(s): HGBA1C in the last 72 hours. CBG: No results for input(s): GLUCAP in the last 168 hours. Lipid Profile: No results for input(s): CHOL, HDL, LDLCALC, TRIG, CHOLHDL, LDLDIRECT in the last 72 hours. Thyroid Function Tests: No results for input(s): TSH, T4TOTAL, FREET4, T3FREE, THYROIDAB in the last 72 hours. Anemia Panel: No results for input(s): VITAMINB12, FOLATE, FERRITIN, TIBC, IRON, RETICCTPCT in the last 72 hours. Urine analysis:    Component Value Date/Time   COLORURINE YELLOW 12/12/2012 [place] 12/12/2012 1127   LABSPEC 1.023 12/12/2012 1127   PHURINE 6.0 12/12/2012 1127   GLUCOSEU NEGATIVE 12/12/2012 1127   HGBUR NEGATIVE 12/12/2012 1127   BILIRUBINUR NEGATIVE 12/12/2012 1127   KETONESUR 15 (A) 12/12/2012 1127   PROTEINUR NEGATIVE 12/12/2012 1127   UROBILINOGEN 2.0 (H) 12/12/2012 1127   NITRITE NEGATIVE 12/12/2012 1127   LEUKOCYTESUR NEGATIVE 12/12/2012 1127   Sepsis Labs: @LABRCNTIP (procalcitonin:4,lacticidven:4) )No results found for this or any previous visit (from the past 240 hour(s)).       Radiology Studies: No  results found.    Scheduled Meds: . bisacodyl  10 mg Rectal Once  . levothyroxine  137 mcg Oral QAC breakfast  . LORazepam  1 mg Oral Once  . morphine  120 mg Oral Q12H  . pantoprazole  40 mg Oral Daily  . polyethylene glycol  17 g Oral Daily  . senna-docusate  1 tablet Oral BID  . tamsulosin  0.4 mg Oral QPC supper   Continuous Infusions:   LOS: 9 days    Time spent in minutes: 35    Debbe Odea, MD [facility] Pager: [email or web address] Password [email or web address] 10/14/2016, 1:11 PM

## 2016-10-14 NOTE — Consult Note (Signed)
Consultation Note Date: 10/14/2016   Patient Name: Edgar Ross  DOB: [birth year]  MRN: [id number]  Age / Sex: 66 y.o., male  PCP: Patient, No Pcp Per Referring Physician: Debbe Odea, MD  Reason for Consultation: Establishing goals of care and Pain control  HPI/Patient Profile: 66 y.o. 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  He is not a candidate for further back surgery.  Plan has been for chemotherapy and radiation but he has not been making his appointments with oncology or for port placement.  He has not been able to lie flat for radiation.  Palliative consulted for goals of care and pain management.  Clinical Assessment and Goals of Care: I met today with Edgar Ross.  He is difficult to engage in conversation.  He repots that pain remains poorly controlled.  He has difficulty relaying information about his pain other than saying it is "bad."  He feels that oxycodone is most effective rescue medication, but it does not completely resolve his pain.   SUMMARY OF RECOMMENDATIONS   - Edgar Ross that he hurts too much right now to discuss goals of care.  Will plan to try to adjust his regimen to get pain under better control and then follow-up to further discuss goals of care. - In the last 24 hours, he has been using a combination of MS Contin (168m BID), Oxycodone (160mx 3 doses), and IV dilaudid (7m75m 3 doses).  This is a total oral morphine equivalent of 345m65m oral morphine.  He Ross that he seems to get the best relief from rescue doses of oxycodone.  I would expect an effective rescue dose of oxycodone to be closer to 20mg10med upon his 24 hour total opioid requirements (anticipate rescue dose of approx 10% of his 24 hour need), but noted some concern for sedation.  Will therefore increase his rescue dose by 50% (to oxycodone 15mg)53m see if this is more effective in controlling pain, not causing sedation, and allowing him to stop utilizing IV rescue medication.  I think that it is possible that this will need to be increased further.  I would also consider rotation to another long acting agent (such as Oxycontin) to see if this is more effective if any side effects or inadequate relief from increasing oxycodone while keeping MS contin as long acting agent.   - I think that this is pain related to bone disease.  Recommend initiation of steroids (no plan for any surgeries per neurosurgery).  Could also consider bisphosphonate therapy. - Will ask member of PMT to follow up tomorrow with hope that Edgar Ross better able to engage in conversation.  Code Status/Advance Care Planning:  Full code   Symptom Management:   As above  Palliative Prophylaxis:   Bowel Regimen and Frequent Pain Assessment  Additional Recommendations (Limitations, Scope, Preferences):  Full Scope Treatment  Psycho-social/Spiritual:   Desire for further Chaplaincy support:no  Additional  Recommendations: Caregiving  Support/Resources  Prognosis:   Unable to determine  Discharge Planning: To Be Determined      Primary Diagnoses: Present on Admission: . Bone metastasis ([place]) . Colon cancer ([place]) . Low back pain   I have reviewed the medical record, interviewed the patient and family, and examined the patient. The following aspects are pertinent.  Past Medical History:  Diagnosis Date  . Anxiety   . Arthritis    right foot  . Asthma as child   no attacks  . Back pain    herniated disc  . Cancer ([place])    lung cancer  . Depression    takes Celexa and Abilify daily  . GERD (gastroesophageal reflux disease)    takes Nexium daily prn  . Insomnia    takes Trazodone nightly  . Lipoma of back   . Numbness    occasionally both feet  . Pneumonia as child   hx of  . PVC (premature ventricular contraction)   . Thyroid  disease   . Tingling    Hx; of left arm  . Trouble swallowing    hx of    Social History   Social History  . Marital status: Divorced    Spouse name: N/A  . Number of children: 2  . Years of education: N/A   Social History Main Topics  . Smoking status: Current Some Day Smoker    Packs/day: 1.00    Years: 40.00    Types: Cigarettes  . Smokeless tobacco: Never Used  . Alcohol use No  . Drug use: No  . Sexual activity: Yes   Other Topics Concern  . None   Social History Narrative  . None   Family History  Problem Relation Age of Onset  . Cancer Mother        bone  . Heart disease Mother   . Hypertension Mother   . Cancer Father        unknown   Scheduled Meds: . bisacodyl  10 mg Rectal Once  . levothyroxine  137 mcg Oral QAC breakfast  . LORazepam  1 mg Oral Once  . morphine  120 mg Oral Q12H  . pantoprazole  40 mg Oral Daily  . polyethylene glycol  17 g Oral Daily  . senna-docusate  1 tablet Oral BID  . tamsulosin  0.4 mg Oral QPC supper   Continuous Infusions: PRN Meds:.cyclobenzaprine, gadobenate dimeglumine, HYDROmorphone (DILAUDID) injection, ondansetron **OR** ondansetron (ZOFRAN) IV, oxyCODONE Medications Prior to Admission:  Prior to Admission medications   Medication Sig Start Date End Date Taking? Authorizing Provider  esomeprazole (NEXIUM) 40 MG capsule Take 1 capsule (40 mg total) by mouth daily as needed (heartburn). 08/20/16  Yes Bonnielee Haff, MD  HYDROcodone-acetaminophen [facility]) 10-325 MG tablet Take 1 tablet by mouth every 4 (four) hours as needed. 09/27/16  Yes Ladell Pier, MD  HYDROmorphone (DILAUDID) 4 MG tablet Take 1 tablet (4 mg total) by mouth every 4 (four) hours as needed for severe pain. 09/11/16  Yes Ladell Pier, MD  levothyroxine (SYNTHROID, LEVOTHROID) 137 MCG tablet Take 1 tablet (137 mcg total) by mouth daily before breakfast. 09/27/16  Yes Ladell Pier, MD  morphine (MS CONTIN) 60 MG 12 hr tablet Take 1 tablet (60  mg total) by mouth every 12 (twelve) hours. 09/27/16  Yes Ladell Pier, MD  polyethylene glycol [facility]) packet Take 17 g by mouth daily. 08/20/16  Yes Bonnielee Haff, MD  senna-docusate (SENOKOT-S) 8.6-50 MG  tablet Take 1 tablet by mouth 2 (two) times daily. 08/20/16  Yes Bonnielee Haff, MD  tamsulosin (FLOMAX) 0.4 MG CAPS capsule Take 1 capsule (0.4 mg total) by mouth daily after supper. 08/20/16  Yes Bonnielee Haff, MD  dexamethasone (DECADRON) 4 MG tablet 2 tabs twice a day for 2 days then 1 tab twice a day Patient not taking: Reported on 10/05/2016 09/08/16   Owens Shark, NP  methocarbamol (ROBAXIN) 500 MG tablet Take 1 tablet (500 mg total) by mouth every 6 (six) hours as needed for muscle spasms. Patient not taking: Reported on 10/05/2016 08/20/16   Bonnielee Haff, MD   Allergies  Allergen Reactions  . Suboxone [Buprenorphine Hcl-Naloxone Hcl] Nausea And Vomiting  . Morphine And Related Nausea And Vomiting  . Vancomycin Hives    Only where applied.   Review of Systems  Constitutional: Positive for activity change, appetite change and fatigue.  Musculoskeletal: Positive for back pain.  Psychiatric/Behavioral: Positive for sleep disturbance.   Physical Exam  General: Alert, awake, in mild distress due to pain.  HEENT: No bruits, no goiter, no JVD Heart: Regular rate and rhythm. No murmur appreciated. Lungs: Good air movement, clear Abdomen: Soft, nontender, nondistended, positive bowel sounds.  Ext: No significant edema Skin: Warm and dry Neuro: Grossly intact, nonfocal.  Vital Signs: BP 139/87 (BP Location: Left Arm)   Pulse 93   Temp 98.6 F (37 C) (Oral)   Resp 16   Ht 6' 4" (1.93 m)   Wt 67.4 kg (148 lb 9.4 oz)   SpO2 100%   BMI 18.09 kg/m  Pain Assessment: 0-10 POSS *See Group Information*: 1-Acceptable,Awake and alert Pain Score: 10-Worst pain ever   SpO2: SpO2: 100 % O2 Device:SpO2: 100 % O2 Flow Rate: .   IO: Intake/output summary:  Intake/Output  Summary (Last 24 hours) at 10/14/16 1817 Last data filed at 10/14/16 1405  Gross per 24 hour  Intake              960 ml  Output                0 ml  Net              960 ml    LBM: Last BM Date: 10/14/16 Baseline Weight: Weight: 67.4 kg (148 lb 9.4 oz) Most recent weight: Weight: 67.4 kg (148 lb 9.4 oz)     Palliative Assessment/Data:     Time In: 1740 Time Out: 1830 Time Total: 50 Greater than 50%  of this time was spent counseling and coordinating care related to the above assessment and plan.  Signed by: Micheline Rough, MD   Please contact Palliative Medicine Team phone at [phone] for questions and concerns.  For individual provider: See Shea Evans

## 2016-10-14 NOTE — Progress Notes (Signed)
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  Attempts being made to determine if Hardware or metastatic disease is cause of severe back pain. MD has stated he is unable to stabiilize spine at this point. He wants to go home but knows he needs to be able to walk     PT Comments    Pt refused to try to stand our walk today due to pain in back despite IV and PO pain medicine. Reiterated recommendation for SNF placement for wheelchair and more transfer training in preparation for home.   Follow Up Recommendations  SNF     Equipment Recommendations  Wheelchair (measurements PT);Wheelchair cushion (measurements PT);Hospital bed    Recommendations for Other Services OT consult     Precautions / Restrictions Precautions Precautions: Fall Restrictions Weight Bearing Restrictions: No    Mobility  Bed Mobility Overal bed mobility: Needs Assistance Bed Mobility: Sidelying to Sit;Sit to Sidelying   Sidelying to sit: Mod assist;HOB elevated     Sit to sidelying: Mod assist;HOB elevated General bed mobility comments: pt moves very slowly and does not allow much hands on assist from PT.  He says he is very limited by pain,   Transfers Overall transfer level: Needs assistance               General transfer comment: Pt would not attempt sit to stand or to transfer to chair despite repeated attmepts. With continued verbal encouragment to continue to transfer, he began to get agitated and returned back to sidelying position.  Talked with patient that he would benefit from SNF to work on getting a wheelchair and transfers to the chair if he was not able to stand or walk.  He still did not feel he could try to stand. Pt was assisted with repositioning with pillows  to make him more comfortable. RN called into room to review and confirm our conversation,.   Ambulation/Gait                 Stairs            Wheelchair Mobility    Modified Rankin (Stroke Patients Only)       Balance   Sitting-balance support: Bilateral upper extremity supported;Feet supported Sitting balance-Leahy Scale: Good                                      Cognition Arousal/Alertness: Awake/alert Behavior During Therapy: WFL for tasks assessed/performed Overall Cognitive Status: Within Functional Limits for tasks assessed                                        Exercises      General Comments        Pertinent Vitals/Pain Pain Assessment: 0-10 Pain Score: 4  Pain Location: back  Pain Descriptors / Indicators: Spasm;Other (Comment) (stiff) Pain Intervention(s): Premedicated before session (RN reports pt received pain med with IV and pills)    Home Living                      Prior Function  PT Goals (current goals can now be found in the care plan section) Acute Rehab PT Goals Patient Stated Goal: to  be able to walk PT Goal Formulation: With patient Time For Goal Achievement: 10/22/16 Potential to Achieve Goals: Fair Progress towards PT goals: Not progressing toward goals - comment (pt refused to stand and transfer due to pain )    Frequency    Min 3X/week      PT Plan Current plan remains appropriate    Co-evaluation              AM-PAC PT "6 Clicks" Daily Activity  Outcome Measure  Difficulty turning over in bed (including adjusting bedclothes, sheets and blankets)?: Total Difficulty moving from lying on back to sitting on the side of the bed? : Total Difficulty sitting down on and standing up from a chair with arms (e.g., wheelchair, bedside commode, etc,.)?: Total Help needed moving to and from a bed to chair (including a wheelchair)?: Total Help needed walking in  hospital room?: Total Help needed climbing 3-5 steps with a railing? : Total 6 Click Score: 6    End of Session   Activity Tolerance: No increased pain Patient left: in bed;with call bell/phone within reach;with bed alarm set Nurse Communication: Mobility status PT Visit Diagnosis: Other abnormalities of gait and mobility (R26.89);Muscle weakness (generalized) (M62.81);Pain Pain - part of body:  (back)     Time: 1420-1445 PT Time Calculation (min) (ACUTE ONLY): 25 min  Charges:  $Therapeutic Activity: 23-37 mins                    G Codes:       Mattalynn Crandle K. Owens Shark, PT    Norwood Levo 10/14/2016, 3:55 PM

## 2016-10-15 DIAGNOSIS — Z515 Encounter for palliative care: Secondary | ICD-10-CM

## 2016-10-15 MED ORDER — HYDROMORPHONE HCL 1 MG/ML IJ SOLN
1.0000 mg | INTRAMUSCULAR | Status: DC | PRN
  Administered 2016-10-15 – 2016-10-20 (×13): 1 mg via INTRAVENOUS
  Filled 2016-10-15 (×14): qty 1

## 2016-10-15 MED ORDER — DIAZEPAM 5 MG PO TABS
5.0000 mg | ORAL_TABLET | Freq: Four times a day (QID) | ORAL | Status: DC | PRN
  Administered 2016-10-15 – 2016-10-19 (×9): 5 mg via ORAL
  Filled 2016-10-15 (×9): qty 1

## 2016-10-15 NOTE — Progress Notes (Signed)
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  The patient underwent an L3 corpectomy and fixation from L2 to S1 bilaterally on 05/28/2016. Dr. Cyndy Freeze has seen pt on follow up in April 2018 and has noted that hardware has changed in position since placement. Dr. Cyndy Freeze explained that while pain is most certainly caused by metastatic  cancer, hardware could be the culprit as well but surgery to fix this would be high risk and GOC would needed to be addressed before proceeding  Subjective: Complaining that his Dilaudid frequency was decreased and he is in severe pain. He is not taking Oxycodone as frequently as it has been ordered for him. Asking for Valium as well.   Assessment & Plan:   Principal Problem:   Low back pain - unfortunately this is liked due to metastatic prostate cancer - oncology, Dr Benay Spice has discussion chemo ad radiation with the patient but the patient is still hesitant to accept these options and believes the hardware is the culprit of his pain - Dr Cyndy Freeze has seen him in consultation and has explained that he is not a surgical candidate - I increased his MS Contin on 6/13 and this had considerable reduced his need for PRNs (he had taken close to 20 mg of IV Dilaudid over 24 hrs in addition to Oxycodone). - the goal is for him to take only Oxycodone for breakthrough pain however, he continues to use Dilaudid and is demanding Valium again.  - He is refusing to go to a SNF and plans to go home but has not done much walking since admission- according to the last PT note, he was easily agitated and was "not receptive to PT's safety recommendations with mobility".   Active Problems:   Colon cancer - 2017 - with metastasis  to L3, sp L3 corpectomy 1/18 - PET >> multiple hypermetabolic bone (B34, L1, left iliac) liver mets and axillary lymph nodes - s/p palliative radiation to lumbar spine but needs further radiation- due to ongoing back pain, he is not able to lay for 45 - 60 min on the table for CT simulation- he is declining chemo - I have asked palliative care to do a goals of care for him  H/o non-small cell lung cancer- adenocarcinoma - Aug 2014- LUL lobectomy  DVT prophylaxis: Lovenox Code Status: Full code Family Communication:  Disposition Plan: home vs SNF when pain controlled - he is refusing SNF Consultants:   oncology Procedures:    Antimicrobials:  Anti-infectives    None       Objective: Vitals:   10/13/16 2041 10/14/16 1405 10/14/16 2110 10/15/16 0529  BP: (!) 140/92 139/87 109/72 114/72  Pulse: 97 93 (!) 108 86  Resp: 18 16 18 18   Temp: 97.9 F (36.6 C) 98.6 F (37 C) 98.7 F (37.1 C) 97.9 F (36.6 C)  TempSrc: Oral Oral Oral Oral  SpO2: 98% 100% 99% 98%  Weight:      Height:        Intake/Output Summary (Last 24 hours) at 10/15/16 1314 Last data filed at 10/15/16 0808  Gross per 24 hour  Intake              960 ml  Output             1000 ml  Net              -40 ml   Filed Weights   10/05/16 2049  Weight: 67.4 kg (148 lb 9.4 oz)    Examination: General exam: Appears comfortable - laying in bed -  HEENT: PERRLA, oral mucosa moist, no sclera icterus or thrush Respiratory system: Clear to auscultation. Respiratory effort normal. Cardiovascular system: S1 & S2 heard, RRR.  No murmurs  Gastrointestinal system: Abdomen soft, non-tender, nondistended. Normal bowel sound. No organomegaly Central nervous system: Alert and oriented. No focal neurological deficits. Extremities: No cyanosis, clubbing or edema Skin: No rashes or ulcers Psychiatry:  Mood and affect normal today    Data Reviewed: I have personally reviewed following labs and imaging  studies  CBC: No results for input(s): WBC, NEUTROABS, HGB, HCT, MCV, PLT in the last 168 hours. Basic Metabolic Panel: No results for input(s): NA, K, CL, CO2, GLUCOSE, BUN, CREATININE, CALCIUM, MG, PHOS in the last 168 hours. GFR: Estimated Creatinine Clearance: 88.9 mL/min (by C-G formula based on SCr of 0.64 mg/dL). Liver Function Tests: No results for input(s): AST, ALT, ALKPHOS, BILITOT, PROT, ALBUMIN in the last 168 hours. No results for input(s): LIPASE, AMYLASE in the last 168 hours. No results for input(s): AMMONIA in the last 168 hours. Coagulation Profile: No results for input(s): INR, PROTIME in the last 168 hours. Cardiac Enzymes: No results for input(s): CKTOTAL, CKMB, CKMBINDEX, TROPONINI in the last 168 hours. BNP (last 3 results) No results for input(s): PROBNP in the last 8760 hours. HbA1C: No results for input(s): HGBA1C in the last 72 hours. CBG: No results for input(s): GLUCAP in the last 168 hours. Lipid Profile: No results for input(s): CHOL, HDL, LDLCALC, TRIG, CHOLHDL, LDLDIRECT in the last 72 hours. Thyroid Function Tests: No results for input(s): TSH, T4TOTAL, FREET4, T3FREE, THYROIDAB in the last 72 hours. Anemia Panel: No results for input(s): VITAMINB12, FOLATE, FERRITIN, TIBC, IRON, RETICCTPCT in the last 72 hours. Urine analysis:    Component Value Date/Time   COLORURINE YELLOW 12/12/2012 [place] 12/12/2012 1127   LABSPEC 1.023 12/12/2012 1127   PHURINE 6.0 12/12/2012 1127   GLUCOSEU NEGATIVE 12/12/2012 1127   HGBUR NEGATIVE 12/12/2012 1127   BILIRUBINUR NEGATIVE 12/12/2012 1127   KETONESUR 15 (A) 12/12/2012 1127   PROTEINUR NEGATIVE 12/12/2012 1127   UROBILINOGEN 2.0 (H) 12/12/2012 1127   NITRITE NEGATIVE 12/12/2012 1127   LEUKOCYTESUR NEGATIVE 12/12/2012 1127   Sepsis Labs: @LABRCNTIP (procalcitonin:4,lacticidven:4) )No results found for this or any previous visit (from the past 240 hour(s)).       Radiology  Studies: No results found.    Scheduled Meds: . bisacodyl  10 mg Rectal Once  . dexamethasone  8 mg Intravenous Q12H  . levothyroxine  137 mcg Oral QAC breakfast  . LORazepam  1 mg Oral Once  . morphine  120 mg Oral Q12H  . pantoprazole  40 mg Oral Daily  . polyethylene glycol  17 g Oral Daily  . senna-docusate  1 tablet Oral BID  . tamsulosin  0.4 mg Oral QPC supper   Continuous Infusions:   LOS: 10 days    Time spent in minutes: 35    Debbe Odea, MD [facility] Pager: [email or web address] Password [email or web address] 10/15/2016, 1:14 PM

## 2016-10-15 NOTE — Progress Notes (Signed)
Daily Progress Note   Patient Name: Edgar Ross       Date: 10/15/2016 DOB: 11-18-1950  Age: 66 y.o. MRN#: [id number] Attending Physician: Debbe Odea, MD Primary Care Physician: Patient, No Pcp Per Admit Date: 10/05/2016  Reason for Consultation/Follow-up: Establishing goals of care and pain control.   Subjective:  Mr gaudin continues to talk about his uncontrolled pain, his spasms and his pain regimen. There is no family in the room See below:   Length of Stay: 10  Current Medications: Scheduled Meds:  . bisacodyl  10 mg Rectal Once  . dexamethasone  8 mg Intravenous Q12H  . levothyroxine  137 mcg Oral QAC breakfast  . LORazepam  1 mg Oral Once  . morphine  120 mg Oral Q12H  . pantoprazole  40 mg Oral Daily  . polyethylene glycol  17 g Oral Daily  . senna-docusate  1 tablet Oral BID  . tamsulosin  0.4 mg Oral QPC supper    Continuous Infusions:   PRN Meds: cyclobenzaprine, diazepam, gadobenate dimeglumine, HYDROmorphone (DILAUDID) injection, ondansetron **OR** ondansetron (ZOFRAN) IV, oxyCODONE  Physical Exam         Awake alert In mild to moderate distress due to pain Regular Clear Abdomen soft Has muscle pain/spasms along side his L paraspinal muscles, has a heating pad on No edema Non focal A little irate due to his pain   Vital Signs: BP 114/72 (BP Location: Left Arm)   Pulse 86   Temp 97.9 F (36.6 C) (Oral)   Resp 18   Ht 6\' 4"  (1.93 m)   Wt 67.4 kg (148 lb 9.4 oz)   SpO2 98%   BMI 18.09 kg/m  SpO2: SpO2: 98 % O2 Device: O2 Device: Not Delivered O2 Flow Rate:    Intake/output summary:   Intake/Output Summary (Last 24 hours) at 10/15/16 1101 Last data filed at 10/15/16 0808  Gross per 24 hour  Intake              960 ml  Output             1000  ml  Net              -40 ml   LBM: Last BM Date: 10/14/16 Baseline Weight: Weight: 67.4 kg (148 lb 9.4 oz) Most recent weight: Weight:  67.4 kg (148 lb 9.4 oz)       Palliative Assessment/Data:      Patient Active Problem List   Diagnosis Date Noted  . Bone metastasis ([place]) 09/29/2016  . Colon cancer ([place]) 08/19/2016  . Low back pain 08/19/2016  . Hypertension 08/19/2016  . Hypothyroidism 08/19/2016  . Lipoma of buttock 04/10/2013  . Lung cancer, Left upper lobe 12/04/2012  . Lipoma of back 08/06/2012  . Lipoma of lower extremity 08/06/2012  . Multiple thyroid nodules 10/26/2011    Palliative Care Assessment & Plan   Patient Profile:  66 y.o. 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  He is not a candidate for further back surgery.  Plan has been for chemotherapy and radiation but he has not been making his appointments with oncology or for port placement.  He has not been able to lie flat for radiation.  Palliative consulted for goals of care and pain management.  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   Recommendations/Plan:  Discussed along with Dr Wynelle Cleveland with the patient this morning, Mr Shehadeh continues to state that his pain/muscle spasms are uncontrolled, he is asking about using IV Dilaudid. I pulled up his medication use and we both talked about his current regimen. Both Dr Wynelle Cleveland and I have encouraged him to ask for his PO PRN medications first, and then to use IV Dilaudid only for breakthrough. Mr Fitzpatrick continues to insist that his pain is severe and is asking about his IV Dilaudid regimen. He does have ongoing spasms and is unable to completely participate in further discussions about his overall goals of care.   I will re instate his Valium 5mg  PO Q 6 hours PRN and monitor.   Agree with MS Contin 120 mg PO BID, he is using 15 mg  Oxy IR doses very sparingly and is waiting out for when he can get IV Dilaudid next. We have had a long discussion regarding this.   PMT will continue to follow along.   Had a BM in the past 24 hours, continue anti emetic and bowel regimen and monitor.      Code Status:    Code Status Orders        Start     Ordered   10/05/16 2246  Full code  Continuous     10/05/16 2247    Code Status History    Date Active Date Inactive Code Status Order ID Comments User Context   08/19/2016  3:47 PM 08/20/2016  5:16 PM Full Code [id number]  Rondel Jumbo, PA-C ED   02/14/2013 12:14 PM 02/15/2013 12:06 PM Full Code [id number]  [name], MD Inpatient       Prognosis:   guarded.    Discharge Planning:  To Be Determined  Care plan was discussed with  Patient, Dr Wynelle Cleveland.   Thank you for allowing the Palliative Medicine Team to assist in the care of this patient.   Time In:  8.30 Time Out: 9 Total Time 30 Prolonged Time Billed  no       Greater than 50%  of this time was spent counseling and coordinating care related to the above assessment and plan.  Loistine Chance, MD [phone]  Please contact Palliative Medicine Team phone at [phone] for questions and concerns.

## 2016-10-15 NOTE — Plan of Care (Signed)
Problem: Pain Managment: Goal: General experience of comfort will improve Outcome: Progressing Pain regimen continuously being assessed and adjusted, palliative care involved in management.

## 2016-10-16 ENCOUNTER — Ambulatory Visit: Payer: Medicaid Other

## 2016-10-16 ENCOUNTER — Ambulatory Visit: Payer: Medicaid Other | Admitting: Radiation Oncology

## 2016-10-16 DIAGNOSIS — C773 Secondary and unspecified malignant neoplasm of axilla and upper limb lymph nodes: Secondary | ICD-10-CM

## 2016-10-16 DIAGNOSIS — Z7189 Other specified counseling: Secondary | ICD-10-CM

## 2016-10-16 MED ORDER — MORPHINE SULFATE ER 30 MG PO TBCR
140.0000 mg | EXTENDED_RELEASE_TABLET | Freq: Two times a day (BID) | ORAL | Status: DC
  Administered 2016-10-16 – 2016-10-17 (×3): 135 mg via ORAL
  Filled 2016-10-16 (×3): qty 4

## 2016-10-16 MED ORDER — OXYCODONE HCL 5 MG PO TABS
20.0000 mg | ORAL_TABLET | ORAL | Status: DC | PRN
Start: 2016-10-16 — End: 2016-10-17
  Administered 2016-10-17 (×3): 20 mg via ORAL
  Filled 2016-10-16 (×3): qty 4

## 2016-10-16 NOTE — Progress Notes (Signed)
IP PROGRESS NOTE  Subjective:   Edgar Ross continues to have back pain. He is unable to ambulate secondary to pain. He has been evaluated by pelvic care medicine.  Objective: Vital signs in last 24 hours: Blood pressure 129/71, pulse 70, temperature 98.1 F (36.7 C), temperature source Oral, resp. rate 20, height _0  (1.93 m), weight 148 lb 9.4 oz (67.4 kg), SpO2 100 %.  Intake/Output from previous day: 06/17 0701 - 06/18 0700 In: 1160 [P.O.:1160] Out: 1100 [Urine:1100]  Physical Exam:  Alert, communicates in sentences     Medications: I have reviewed the patient's current medications.  Assessment/Plan:  1. Colon cancer, status post a polypectomy in 2017 revealing a focus of adenocarcinoma ? Back pain with tumor involving L3, status post an L3 corpectomy 05/28/2016 confirming metastatic adenocarcinoma consistent with a colon primary (cytokeratin 20 and CDX2positive), comparison of the pathology to the 2017 polypectomy showed similar morphology; K-ras mutation detected;no evidence of DNA mismatch repair deficiency; intact tumor nuclear staining for MLH1, MSH2, MSH6 and PMS2 ? He completed postoperative radiation to the lumbar spine, 3000 cGyin 10 fractions, 06/26/2016-07/10/2016 ? PET scan 06/19/2016 revealed multiple hypermetabolic bone metastases, hypermetabolic liver metastases, indeterminate mildlyhypermetabolic axillary lymph nodes ? Status post palliative radiation to the lumbar spine ? CTs 09/05/2016-diffuse liver metastases. Lytic bone metastases in the thoracic/lumbar spine and left ilium. ? CT lumbar spine 10/09/2016-metastatic disease at multiple levels of the thoracic and lumbar spine including the posterior elements at L1, T11, and the left iliac  2. Non-small cell lung cancer, adenocarcinoma, T2 N0, ALK positive, status post a left upper lobectomy August 2014  3. Pain secondary to #1-Severe low back pain, unrelieved with MS Contin/hydrocodone as an  outpatient.  4. History of a thyroidectomy  5. Colonoscopy 07/07/2016-benign polypoid lesion the sigmoid colon-biopsied, diminutive polyps in the rectum, tattoo in the sigmoid colon-site appeared normal  Edgar Ross is alert this morning. He continues to have low back pain that limits his ability to ambulate. I encouraged him to use oxycodone for breakthrough pain.  I explained the pain is related to metastatic carcinoma involving the spine. He also has metastatic carcinoma in other sites. Neurosurgery does not recommend surgical intervention and he is unable to tolerate radiation. I explained the only treatment option for the metastatic cancer is chemotherapy. We discussed a trial of FOLFOX chemotherapy versus Hospice care.  He is agitated this morning. He will not agree to chemotherapy at present. He states that he will not agree to chemotherapy unless he is ambulatory. I recommend Hospice care. I explained he may need placement with hospice. He agrees to a hospice referral.  Recommendations:  1. Continue MS Contin and oxycodone for pain 2. [facility] hospice referral 3. Continue goals of care and CODE STATUS discussion   I discussed the case with Dr. Wynelle Cleveland.     LOS: 11 days   Donneta Romberg, MD   10/16/2016, 2:07 PM

## 2016-10-16 NOTE — Progress Notes (Signed)
Nutrition Brief Note  Patient identified with LOS x 11 days.  Patient's weight has been stable since April 2018. Pt eating really well, 75-100% throughout admission.  Wt Readings from Last 15 Encounters:  10/05/16 148 lb 9.4 oz (67.4 kg)  10/05/16 145 lb 8 oz (66 kg)  09/28/16 136 lb 9.6 oz (62 kg)  09/27/16 143 lb 9.6 oz (65.1 kg)  09/11/16 143 lb 4.8 oz (65 kg)  09/08/16 142 lb 1.6 oz (64.5 kg)  08/24/16 146 lb 3.2 oz (66.3 kg)  08/22/16 148 lb 12.8 oz (67.5 kg)  08/20/16 146 lb 6.2 oz (66.4 kg)  07/28/13 178 lb (80.7 kg)  04/10/13 178 lb 8 oz (81 kg)  04/03/13 169 lb (76.7 kg)  03/04/13 169 lb (76.7 kg)  02/14/13 166 lb (75.3 kg)  02/05/13 166 lb 6.4 oz (75.5 kg)    Body mass index is 18.09 kg/m. Patient meets criteria for underweight based on current BMI.   Current diet order is regular, patient is consuming approximately 100% of meals at this time. Labs and medications reviewed.   No nutrition interventions warranted at this time. If nutrition issues arise, please consult RD.   Clayton Bibles, MS, RD, LDN Pager: [phone] After Hours Pager: [phone]

## 2016-10-16 NOTE — Progress Notes (Signed)
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  Attempts being made to determine if Hardware or metastatic disease is cause of severe back pain. MD has stated he is unable to stabiilize spine at this point. He wants to go home but knows he needs to be able to walk     PT Comments    Supervision for supine to sit. Pt reported back pain and muscle spasms with sitting on edge of bed so returned to sidelying with mod assist for BLEs. Pain limiting progress with mobility.   Follow Up Recommendations  SNF     Equipment Recommendations  Wheelchair (measurements PT);Wheelchair cushion (measurements PT);Hospital bed    Recommendations for Other Services OT consult     Precautions / Restrictions Precautions Precautions: Fall Restrictions Weight Bearing Restrictions: No    Mobility  Bed Mobility Overal bed mobility: Needs Assistance Bed Mobility: Sidelying to Sit;Sit to Sidelying   Sidelying to sit: HOB elevated;Supervision     Sit to sidelying: HOB elevated;Supervision; mod A General bed mobility comments:supervision for supine to sit using rail, mod A for BLEs with sit to sidelying pt moves very slowly and does not allow much hands on assist from PT.  He says he is very limited by pain & muscle spasms in back, RN notified, requested muscle relaxer.   Transfers                 General transfer comment: unable to attempt 2* back pain; per NT he has been doing transfers to bedside commode with assistance  Ambulation/Gait                 Stairs            Wheelchair Mobility    Modified Rankin (Stroke Patients Only)       Balance   Sitting-balance support: Bilateral upper extremity supported;Feet supported Sitting balance-Leahy  Scale: Good                                      Cognition Arousal/Alertness: Awake/alert Behavior During Therapy: WFL for tasks assessed/performed Overall Cognitive Status: Within Functional Limits for tasks assessed                                        Exercises      General Comments        Pertinent Vitals/Pain Pain Score: 7  Pain Location: back  Pain Descriptors / Indicators: Spasm Pain Intervention(s): Limited activity within patient's tolerance;Monitored during session;Premedicated before session    Home Living                      Prior Function            PT Goals (current goals can now be found in the care plan section)      Frequency    Min 3X/week      PT Plan Current plan remains appropriate    Co-evaluation              AM-PAC PT "6 Clicks" Daily Activity  Outcome Measure  Difficulty  turning over in bed (including adjusting bedclothes, sheets and blankets)?: Total Difficulty moving from lying on back to sitting on the side of the bed? : Total Difficulty sitting down on and standing up from a chair with arms (e.g., wheelchair, bedside commode, etc,.)?: Total Help needed moving to and from a bed to chair (including a wheelchair)?: Total Help needed walking in hospital room?: Total Help needed climbing 3-5 steps with a railing? : Total 6 Click Score: 6    End of Session   Activity Tolerance: Patient limited by pain Patient left: in bed;with call bell/phone within reach;with bed alarm set Nurse Communication: Mobility status;Patient requests pain meds PT Visit Diagnosis: Other abnormalities of gait and mobility (R26.89);Muscle weakness (generalized) (M62.81);Pain Pain - part of body:  (back)     Time: 6333-5456 PT Time Calculation (min) (ACUTE ONLY): 13 min  Charges:  $Therapeutic Activity: 8-22 mins                    G Codes:          Philomena Doheny 10/16/2016, 2:17  PM [phone]

## 2016-10-16 NOTE — Progress Notes (Signed)
Daily Progress Note   Patient Name: Edgar Ross       Date: 10/16/2016 DOB: 13-Sep-1950  Age: 66 y.o. MRN#: [id number] Attending Physician: Debbe Odea, MD Primary Care Physician: Patient, No Pcp Per Admit Date: 10/05/2016  Reason for Consultation/Follow-up: Establishing goals of care and pain control.   Subjective:  Edgar Ross states that his pain is better controlled today, see discussions below    Length of Stay: 11  Current Medications: Scheduled Meds:  . bisacodyl  10 mg Rectal Once  . dexamethasone  8 mg Intravenous Q12H  . levothyroxine  137 mcg Oral QAC breakfast  . LORazepam  1 mg Oral Once  . morphine  135 mg Oral Q12H  . pantoprazole  40 mg Oral Daily  . polyethylene glycol  17 g Oral Daily  . senna-docusate  1 tablet Oral BID  . tamsulosin  0.4 mg Oral QPC supper    Continuous Infusions:   PRN Meds: cyclobenzaprine, diazepam, gadobenate dimeglumine, HYDROmorphone (DILAUDID) injection, ondansetron **OR** ondansetron (ZOFRAN) IV, oxyCODONE  Physical Exam         Awake alert Sitting up in bed Regular Clear Abdomen soft  No edema Non focal   Vital Signs: BP 129/71 (BP Location: Left Arm)   Pulse 70   Temp 98.1 F (36.7 C) (Oral)   Resp 20   Ht 6\' 4"  (1.93 m)   Wt 67.4 kg (148 lb 9.4 oz)   SpO2 100%   BMI 18.09 kg/m  SpO2: SpO2: 100 % O2 Device: O2 Device: Not Delivered O2 Flow Rate:    Intake/output summary:   Intake/Output Summary (Last 24 hours) at 10/16/16 1456 Last data filed at 10/16/16 1311  Gross per 24 hour  Intake              600 ml  Output              800 ml  Net             -200 ml   LBM: Last BM Date: 10/15/16 Baseline Weight: Weight: 67.4 kg (148 lb 9.4 oz) Most recent weight: Weight: 67.4 kg (148 lb 9.4 oz)       Palliative  Assessment/Data:      Patient Active Problem List   Diagnosis Date Noted  . Encounter for palliative care   .  Bone metastasis ([place]) 09/29/2016  . Colon cancer ([place]) 08/19/2016  . Lumbar pain 08/19/2016  . Hypertension 08/19/2016  . Hypothyroidism 08/19/2016  . Lipoma of buttock 04/10/2013  . Lung cancer, Left upper lobe 12/04/2012  . Lipoma of back 08/06/2012  . Lipoma of lower extremity 08/06/2012  . Multiple thyroid nodules 10/26/2011    Palliative Care Assessment & Plan   Patient Profile:  66 y.o. 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  He is not a candidate for further back surgery.  Plan has been for chemotherapy and radiation but he has not been making his appointments with oncology or for port placement.  He has not been able to lie flat for radiation.  Palliative consulted for goals of care and pain management.  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   Recommendations/Plan: Discussed with patient and Dr Wynelle Cleveland, agree with current pain medication changes, pain is some what better controlled today, continue to monitor. Tolerating PO diet well, states he has had almost 20 lb unintentional weight loss in the past 2 months never the less. Bowels moving well.   Advanced directives discussions: Edgar Ross does not have a spouse, he has his mother, siblings and 2 daughters, one daughter lives in [place], one daughter lives in [place]. He elects his mother and his daughter from [place] to make decisions on his behalf if he is ever not able to make his own decisions. I will request chaplain follow up to see if he is interested in completing health care power of attorney paperwork.   Goals of care: Edgar Ross wishes to continue with full code status, states he never told anyone he wants hospice. He states he has his faith, wants to continue discussions  regarding his treatment options with his team. He wants to participate in physical therapy, wants to make efforts to start standing/walking. He wants to go home towards the end of this hospitalization. We will simply continue to engage with Edgar Dino and help guide appropriate decision making.     Code Status:    Code Status Orders        Start     Ordered   10/05/16 2246  Full code  Continuous     10/05/16 2247    Code Status History    Date Active Date Inactive Code Status Order ID Comments User Context   08/19/2016  3:47 PM 08/20/2016  5:16 PM Full Code [id number]  Rondel Jumbo, PA-C ED   02/14/2013 12:14 PM 02/15/2013 12:06 PM Full Code [id number]  [name], MD Inpatient       Prognosis:   guarded.    Discharge Planning:  To Be Determined  Care plan was discussed with  Patient, Dr Wynelle Cleveland.   Thank you for allowing the Palliative Medicine Team to assist in the care of this patient.   Time In: 1430 Time Out: 1500 Total Time 30 Prolonged Time Billed  no       Greater than 50%  of this time was spent counseling and coordinating care related to the above assessment and plan.  Loistine Chance, MD [phone]  Please contact Palliative Medicine Team phone at [phone] for questions and concerns.

## 2016-10-16 NOTE — Progress Notes (Addendum)
PROGRESS NOTE    Edgar Ross   SEG:[id number]  DOB: 1950-06-13  DOA: 10/05/2016 PCP: Patient, No Pcp Per   Brief Narrative:  66 yo male with history of colon cancer with metastasis to the spine (s/p polypectomy in 2017), admitted for lower back pain despite being started on MS Contin and hydrocodone.  The patient underwent an L3 corpectomy and fixation from L2 to S1 bilaterally on 05/28/2016. Dr. Cyndy Freeze has seen pt on follow up in April 2018 and has noted that hardware has changed in position since placement. Dr. Cyndy Freeze explained that while pain is most certainly caused by metastatic  cancer, hardware could be the culprit as well but surgery to fix this would be high risk and GOC would needed to be addressed before proceeding  Subjective: Having pain this AM. He feels that his pain medication wears off by the morning. He was able to walk to the bathroom yesterday. He states his goals are to to be able to tolerate radiation and chemo and is not ready for palliative care. We have discussed pain management options in detail and he is in agreement with the plan.   Assessment & Plan:   Principal Problem:   Low back pain - unfortunately this is liked due to metastatic prostate cancer - oncology, Dr Benay Spice has discussion chemo ad radiation with the patient but the patient is still hesitant to accept these options and believes the hardware is the culprit of his pain - Dr Cyndy Freeze has seen him in consultation and has explained that he is not a surgical candidate - I increased his MS Contin on 6/13 and this had considerable reduced his need for PRNs (he had taken close to 20 mg of IV Dilaudid over 24 hrs in addition to Oxycodone). - the goal is for him to take only Oxycodone for breakthrough pain however, he continues to use Dilaudid   - He is refusing to go to a SNF and plans to go home but has not done much walking since admission- according to the last PT note, he was easily agitated and was "not  receptive to PT's safety recommendations with mobility".  - we have extensively discussed his situation again today including his pain management and further plans for chemo and radiation. He is in agreement with radiation and with chemo once his pain comes under control. He understands to continue to choose oral PRN for pain control over IV Dialudid   Active Problems:   Colon cancer - 2017 - with metastasis to L3, sp L3 corpectomy 1/18 - PET >> multiple hypermetabolic bone (V37, L1, left iliac) liver mets and axillary lymph nodes - s/p palliative radiation to lumbar spine but needs further radiation- due to ongoing back pain, he is not able to lay for 45 - 60 min on the table for CT simulation- he is declining chemo   H/o non-small cell lung cancer- adenocarcinoma - Aug 2014- LUL lobectomy  DVT prophylaxis: Lovenox Code Status: Full code Family Communication:  Disposition Plan: home vs SNF when pain controlled - he is refusing SNF Consultants:   oncology Procedures:    Antimicrobials:  Anti-infectives    None       Objective: Vitals:   10/15/16 0529 10/15/16 1447 10/15/16 2032 10/16/16 0654  BP: 114/72 125/81 (!) 144/90 129/71  Pulse: 86 78 96 70  Resp: 18 18 18 20   Temp: 97.9 F (36.6 C) 98.8 F (37.1 C) 97.9 F (36.6 C) 98.1 F (36.7 C)  TempSrc: Oral  Oral Oral Oral  SpO2: 98% 100% 99% 100%  Weight:      Height:        Intake/Output Summary (Last 24 hours) at 10/16/16 1443 Last data filed at 10/16/16 1311  Gross per 24 hour  Intake              600 ml  Output              800 ml  Net             -200 ml   Filed Weights   10/05/16 2049  Weight: 67.4 kg (148 lb 9.4 oz)    Examination: General exam: Appears uncomfortable today- in pain HEENT: PERRLA, oral mucosa moist, no sclera icterus or thrush Respiratory system: Clear to auscultation. Respiratory effort normal. Cardiovascular system: S1 & S2 heard, RRR.  No murmurs  Gastrointestinal system: Abdomen  soft, non-tender, nondistended. Normal bowel sound. No organomegaly Central nervous system: Alert and oriented. No focal neurological deficits. Extremities: No cyanosis, clubbing or edema Skin: No rashes or ulcers Psychiatry:  Mood and affect normal today    Data Reviewed: I have personally reviewed following labs and imaging studies  CBC: No results for input(s): WBC, NEUTROABS, HGB, HCT, MCV, PLT in the last 168 hours. Basic Metabolic Panel: No results for input(s): NA, K, CL, CO2, GLUCOSE, BUN, CREATININE, CALCIUM, MG, PHOS in the last 168 hours. GFR: Estimated Creatinine Clearance: 88.9 mL/min (by C-G formula based on SCr of 0.64 mg/dL). Liver Function Tests: No results for input(s): AST, ALT, ALKPHOS, BILITOT, PROT, ALBUMIN in the last 168 hours. No results for input(s): LIPASE, AMYLASE in the last 168 hours. No results for input(s): AMMONIA in the last 168 hours. Coagulation Profile: No results for input(s): INR, PROTIME in the last 168 hours. Cardiac Enzymes: No results for input(s): CKTOTAL, CKMB, CKMBINDEX, TROPONINI in the last 168 hours. BNP (last 3 results) No results for input(s): PROBNP in the last 8760 hours. HbA1C: No results for input(s): HGBA1C in the last 72 hours. CBG: No results for input(s): GLUCAP in the last 168 hours. Lipid Profile: No results for input(s): CHOL, HDL, LDLCALC, TRIG, CHOLHDL, LDLDIRECT in the last 72 hours. Thyroid Function Tests: No results for input(s): TSH, T4TOTAL, FREET4, T3FREE, THYROIDAB in the last 72 hours. Anemia Panel: No results for input(s): VITAMINB12, FOLATE, FERRITIN, TIBC, IRON, RETICCTPCT in the last 72 hours. Urine analysis:    Component Value Date/Time   COLORURINE YELLOW 12/12/2012 [place] 12/12/2012 1127   LABSPEC 1.023 12/12/2012 1127   PHURINE 6.0 12/12/2012 1127   GLUCOSEU NEGATIVE 12/12/2012 1127   HGBUR NEGATIVE 12/12/2012 1127   BILIRUBINUR NEGATIVE 12/12/2012 1127   KETONESUR 15 (A)  12/12/2012 1127   PROTEINUR NEGATIVE 12/12/2012 1127   UROBILINOGEN 2.0 (H) 12/12/2012 1127   NITRITE NEGATIVE 12/12/2012 1127   LEUKOCYTESUR NEGATIVE 12/12/2012 1127   Sepsis Labs: @LABRCNTIP (procalcitonin:4,lacticidven:4) )No results found for this or any previous visit (from the past 240 hour(s)).       Radiology Studies: No results found.    Scheduled Meds: . bisacodyl  10 mg Rectal Once  . dexamethasone  8 mg Intravenous Q12H  . levothyroxine  137 mcg Oral QAC breakfast  . LORazepam  1 mg Oral Once  . morphine  135 mg Oral Q12H  . pantoprazole  40 mg Oral Daily  . polyethylene glycol  17 g Oral Daily  . senna-docusate  1 tablet Oral BID  . tamsulosin  0.4 mg  Oral QPC supper   Continuous Infusions:   LOS: 11 days    Time spent in minutes: 35 Greater > 50 % of time spent in discussing his medications, his plan of care in the hospital and goals of care for when he is discharged.    Debbe Odea, MD [facility] Pager: [email or web address] Password [facility] 10/16/2016, 2:43 PM

## 2016-10-16 NOTE — Progress Notes (Signed)
Pt in better spirits this PM; states pain is better & wants to discuss CT scan/radiation/ et all In the am. Pt  pleased that he can raise arms above head; he feels he can tolerate lying flat with the change in pain med regimen.

## 2016-10-16 NOTE — Progress Notes (Signed)
CM consult for hospice. Per PMT note pt is not interested in hospice at this time. CM will continue to follow. Marney Doctor RN,BSN,NCM [phone]

## 2016-10-17 ENCOUNTER — Telehealth: Payer: Self-pay | Admitting: *Deleted

## 2016-10-17 ENCOUNTER — Ambulatory Visit: Payer: Medicaid Other

## 2016-10-17 MED ORDER — OXYCODONE HCL 5 MG PO TABS
30.0000 mg | ORAL_TABLET | ORAL | Status: DC | PRN
  Administered 2016-10-17 – 2016-10-18 (×3): 30 mg via ORAL
  Filled 2016-10-17 (×4): qty 6

## 2016-10-17 MED ORDER — MORPHINE SULFATE ER 100 MG PO TBCR
160.0000 mg | EXTENDED_RELEASE_TABLET | Freq: Two times a day (BID) | ORAL | Status: DC
  Administered 2016-10-17 – 2016-10-20 (×7): 160 mg via ORAL
  Filled 2016-10-17 (×7): qty 2

## 2016-10-17 NOTE — Progress Notes (Signed)
Daily Progress Note   Patient Name: Edgar Ross       Date: 10/17/2016 DOB: February 28, 1951  Age: 66 y.o. MRN#: [id number] Attending Physician: Debbe Odea, MD Primary Care Physician: Patient, No Pcp Per Admit Date: 10/05/2016  Reason for Consultation/Follow-up: Establishing goals of care and pain control.   Subjective:  Edgar Ross states that his pain is better controlled today, but he had a bad night last night, see discussions below    Length of Stay: 12  Current Medications: Scheduled Meds:  . bisacodyl  10 mg Rectal Once  . dexamethasone  8 mg Intravenous Q12H  . levothyroxine  137 mcg Oral QAC breakfast  . LORazepam  1 mg Oral Once  . morphine  160 mg Oral Q12H  . pantoprazole  40 mg Oral Daily  . polyethylene glycol  17 g Oral Daily  . senna-docusate  1 tablet Oral BID  . tamsulosin  0.4 mg Oral QPC supper    Continuous Infusions:   PRN Meds: cyclobenzaprine, diazepam, gadobenate dimeglumine, HYDROmorphone (DILAUDID) injection, ondansetron **OR** ondansetron (ZOFRAN) IV, oxyCODONE  Physical Exam         Awake alert Sitting up in bed Regular Clear Abdomen soft  No edema Non focal   Vital Signs: BP (!) 133/99 (BP Location: Left Arm)   Pulse 86   Temp 98 F (36.7 C) (Oral)   Resp 20   Ht 6\' 4"  (1.93 m)   Wt 67.4 kg (148 lb 9.4 oz)   SpO2 99%   BMI 18.09 kg/m  SpO2: SpO2: 99 % O2 Device: O2 Device: Not Delivered O2 Flow Rate:    Intake/output summary:   Intake/Output Summary (Last 24 hours) at 10/17/16 1859 Last data filed at 10/17/16 1527  Gross per 24 hour  Intake              840 ml  Output             1400 ml  Net             -560 ml   LBM: Last BM Date: 10/16/16 Baseline Weight: Weight: 67.4 kg (148 lb 9.4 oz) Most recent weight: Weight: 67.4 kg  (148 lb 9.4 oz)       Palliative Assessment/Data:    Flowsheet Rows     Most Recent Value  Intake Tab  Referral Department  Hospitalist  Unit at Time of Referral  Oncology Unit  Palliative Care Primary Diagnosis  Cancer  Date Notified  10/13/16  Palliative Care Type  New Palliative care  Reason for referral  Pain, Clarify Goals of Care  Date of Admission  10/05/16  Date first seen by Palliative Care  10/14/16  # of days Palliative referral response time  1 Day(s)  # of days IP prior to Palliative referral  8  Clinical Assessment  Psychosocial & Spiritual Assessment  Palliative Care Outcomes      Patient Active Problem List   Diagnosis Date Noted  . Goals of care, counseling/discussion   . Encounter for palliative care   . Bone metastasis ([place]) 09/29/2016  . Colon cancer ([place]) 08/19/2016  . Lumbar pain 08/19/2016  . Hypertension 08/19/2016  . Hypothyroidism 08/19/2016  . Lipoma of buttock 04/10/2013  . Lung cancer, Left upper lobe 12/04/2012  . Lipoma of back 08/06/2012  . Lipoma of lower extremity 08/06/2012  . Multiple thyroid nodules 10/26/2011    Palliative Care Assessment & Plan   Patient Profile:  66 y.o. 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  He is not a candidate for further back surgery.  Plan has been for chemotherapy and radiation but he has not been making his appointments with oncology or for port placement.  He has not been able to lie flat for radiation.  Palliative consulted for goals of care and pain management.  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   Recommendations/Plan: Pain:  Reports that pain is better overall but he had a "horrible" night last night.  In the last 24 hours, he has had 1mg  x 4 doses of IV dilaudid (80mg  oral morphine equivalent), oxycodone 20 mg x 3 doses (90mg  oral morphine equivalent) and  MS Contin 135mg  x 2 doses (270mg  oral morphine equivalent) for a total 24 hour oral morphine equivalent of 440mg .  He reports that he does not get sufficient relief from oxycodone, but will have improvement if her has oxycodone + rescue dilaudid.  With total oral morphine equivalent of 440mg , will plan to increase MS Contin to 160mg  BID while also increasing rescue dose to oxycodone 30mg .  This is roughly 10% of his total daily dose and a 50% increase from prior rescue dose that was ineffective but closer to opioid equivalent of effective combination of oxycodone + rescue dilaudid.  Advanced directives discussions: Edgar Ross does not have a spouse, he has his mother, siblings and 2 daughters, one daughter lives in [place], one daughter lives in [place]. He elects his mother and his daughter from [place] to make decisions on his behalf if he is ever not able to make his own decisions. Plan for chaplain follow up to see if he is interested in completing health care power of attorney paperwork.   Goals of care: Edgar Ross wishes to continue with full code status, is not interested in hospice.  Code Status:    Code Status Orders        Start     Ordered   10/05/16 2246  Full code  Continuous     10/05/16 2247    Code Status History    Date Active Date Inactive Code Status Order ID Comments User Context   08/19/2016  3:47 PM 08/20/2016  5:16 PM Full Code  [id number]  Rondel Jumbo, PA-C ED   02/14/2013 12:14 PM 02/15/2013 12:06 PM Full Code [id number]  [name], MD Inpatient       Prognosis:   guarded.    Discharge Planning:  To Be Determined  Care plan was discussed with  Patient, Dr Wynelle Cleveland.   Thank you for allowing the Palliative Medicine Team to assist in the care of this patient.  Total time: 45 minutes    Greater than 50%  of this time was spent counseling and coordinating care related to the above assessment and plan.  Micheline Rough, MD [place]  Team [phone]  Please contact Palliative Medicine Team phone at [phone] for questions and concerns.

## 2016-10-17 NOTE — Telephone Encounter (Signed)
called  [address] fro room 1336, spoke with RN Maggie, informed her patient's CT simulation is scheduled tomorrow morning at 9:30am, that is the only time available, asked her to speak with the patient, she was in patient's room and I heard the conversation between RN and the patient, he said"yes as long as he is medicated for pain he will come down in the morning'", RN p ut on the board in patient's room for Ct simulation tomorrow at (;#0am for 1 hour, thanked Therapist, sports, called Ct sim, spoke with Therapist Katie 3:27 PM

## 2016-10-17 NOTE — Progress Notes (Signed)
Pt unwilling to allow staff to place bed in low position. Pt severely agitated.  Explained to patient that for safety reasons the bed needs to remain in the low position.  Also reminded pt that the bedside table is on wheels and does not lock and therefore should not be used to brace against or to pull on to help with repositioning in the bed.  Pt became verbally abusive and refused all teaching.  Tried to calmly reinforce hospital wide pt safety standards. Pt cont to shout and refuse education.  Will cont to try and maintain pt safety as best pt will allow at this time.

## 2016-10-17 NOTE — Progress Notes (Signed)
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  The patient underwent an L3 corpectomy and fixation from L2 to S1 bilaterally on 05/28/2016. Dr. Cyndy Freeze has seen pt on follow up in April 2018 and has noted that hardware has changed in position since placement. Dr. Cyndy Freeze explained that while pain is most certainly caused by metastatic  cancer, hardware could be the culprit as well but surgery to fix this would be high risk and GOC would needed to be addressed before proceeding  Subjective: Pain is better controlled but feels he needs the IV Dilaudid as soon as he wakes up to help relieve his pain and allow him to move out of bed. Having normal BMs. No new complaints. Wants to try CT simulation again tomorrow.   Assessment & Plan:   Principal Problem:   Low back pain - unfortunately this is liked due to metastatic prostate cancer - oncology, Dr Benay Spice has discussion chemo ad radiation with the patient but the patient is still hesitant to accept these options and believes the hardware is the culprit of his pain - Dr Cyndy Freeze has seen him in consultation and has explained that he is not a surgical candidate - I increased his MS Contin on 6/13 and this had considerable reduced his need for PRNs (he had taken close to 20 mg of IV Dilaudid over 24 hrs in addition to Oxycodone). - 6/16- the goal is for him to take only Oxycodone for breakthrough pain however, he continues to use Dilaudid   - He is refusing to go to a SNF and plans to go home but has not done much walking since admission- according to the last PT note, he was easily agitated and was "not receptive to PT's safety recommendations with mobility".  - 6/18- we have extensively discussed his  situation again today including his pain management and further plans for chemo and radiation. He is in agreement with radiation and with chemo once his pain comes under control. He understands to continue to choose oral PRN for pain control over IV Dialudid  - he is now beginning to ambulate to the bathroom and has been working with PT  Active Problems:   Colon cancer - 2017 - with metastasis to L3, sp L3 corpectomy 1/18 - PET >> multiple hypermetabolic bone (J68, L1, left iliac) liver mets and axillary lymph nodes - s/p palliative radiation to lumbar spine but needs further radiation- due to ongoing back pain, he was not able to lay for 45 - 60 min on the table for CT simulation-  - he states he will try again tomorrow- he is further willing to talk about chemo with Dr Ammie Dalton once his pain is controlled   H/o non-small cell lung cancer- adenocarcinoma - Aug 2014- LUL lobectomy  DVT prophylaxis: Lovenox Code Status: Full code Family Communication:  Disposition Plan: home when pain controlled - he is refusing SNF Consultants:   oncology Procedures:    Antimicrobials:  Anti-infectives    None       Objective: Vitals:   10/16/16 0654 10/16/16 1500 10/16/16 2143 10/17/16 0559  BP: 129/71 127/84 117/86 136/77  Pulse: 70 80 81 80  Resp: 20 20 19 20   Temp: 98.1  F (36.7 C) 98.4 F (36.9 C) 98.1 F (36.7 C) 98.9 F (37.2 C)  TempSrc: Oral Oral Oral Oral  SpO2: 100% 99% 99% 98%  Weight:      Height:        Intake/Output Summary (Last 24 hours) at 10/17/16 1515 Last data filed at 10/17/16 1252  Gross per 24 hour  Intake              840 ml  Output              600 ml  Net              240 ml   Filed Weights   10/05/16 2049  Weight: 67.4 kg (148 lb 9.4 oz)    Examination: General exam: Appears uncomfortable today- in pain HEENT: PERRLA, oral mucosa moist, no sclera icterus or thrush Respiratory system: Clear to auscultation. Respiratory effort  normal. Cardiovascular system: S1 & S2 heard, RRR.  No murmurs  Gastrointestinal system: Abdomen soft, non-tender, nondistended. Normal bowel sound. No organomegaly Central nervous system: Alert and oriented. No focal neurological deficits. Extremities: No cyanosis, clubbing or edema Skin: No rashes or ulcers Psychiatry:  Mood and affect normal today    Data Reviewed: I have personally reviewed following labs and imaging studies  CBC: No results for input(s): WBC, NEUTROABS, HGB, HCT, MCV, PLT in the last 168 hours. Basic Metabolic Panel: No results for input(s): NA, K, CL, CO2, GLUCOSE, BUN, CREATININE, CALCIUM, MG, PHOS in the last 168 hours. GFR: Estimated Creatinine Clearance: 88.9 mL/min (by C-G formula based on SCr of 0.64 mg/dL). Liver Function Tests: No results for input(s): AST, ALT, ALKPHOS, BILITOT, PROT, ALBUMIN in the last 168 hours. No results for input(s): LIPASE, AMYLASE in the last 168 hours. No results for input(s): AMMONIA in the last 168 hours. Coagulation Profile: No results for input(s): INR, PROTIME in the last 168 hours. Cardiac Enzymes: No results for input(s): CKTOTAL, CKMB, CKMBINDEX, TROPONINI in the last 168 hours. BNP (last 3 results) No results for input(s): PROBNP in the last 8760 hours. HbA1C: No results for input(s): HGBA1C in the last 72 hours. CBG: No results for input(s): GLUCAP in the last 168 hours. Lipid Profile: No results for input(s): CHOL, HDL, LDLCALC, TRIG, CHOLHDL, LDLDIRECT in the last 72 hours. Thyroid Function Tests: No results for input(s): TSH, T4TOTAL, FREET4, T3FREE, THYROIDAB in the last 72 hours. Anemia Panel: No results for input(s): VITAMINB12, FOLATE, FERRITIN, TIBC, IRON, RETICCTPCT in the last 72 hours. Urine analysis:    Component Value Date/Time   COLORURINE YELLOW 12/12/2012 [place] 12/12/2012 1127   LABSPEC 1.023 12/12/2012 1127   PHURINE 6.0 12/12/2012 1127   GLUCOSEU NEGATIVE 12/12/2012  1127   HGBUR NEGATIVE 12/12/2012 1127   BILIRUBINUR NEGATIVE 12/12/2012 1127   KETONESUR 15 (A) 12/12/2012 1127   PROTEINUR NEGATIVE 12/12/2012 1127   UROBILINOGEN 2.0 (H) 12/12/2012 1127   NITRITE NEGATIVE 12/12/2012 1127   LEUKOCYTESUR NEGATIVE 12/12/2012 1127   Sepsis Labs: @LABRCNTIP (procalcitonin:4,lacticidven:4) )No results found for this or any previous visit (from the past 240 hour(s)).       Radiology Studies: No results found.    Scheduled Meds: . bisacodyl  10 mg Rectal Once  . dexamethasone  8 mg Intravenous Q12H  . levothyroxine  137 mcg Oral QAC breakfast  . LORazepam  1 mg Oral Once  . morphine  135 mg Oral Q12H  . pantoprazole  40 mg Oral Daily  . polyethylene glycol  17 g Oral Daily  . senna-docusate  1 tablet Oral BID  . tamsulosin  0.4 mg Oral QPC supper   Continuous Infusions:   LOS: 12 days    Time spent in minutes: 35 Greater > 50 % of time spent in discussing his medications, his plan of care in the hospital and goals of care for when he is discharged.    Debbe Odea, MD [facility] Pager: [email or web address] Password [email or web address] 10/17/2016, 3:15 PM

## 2016-10-18 ENCOUNTER — Ambulatory Visit: Payer: Medicaid Other

## 2016-10-18 ENCOUNTER — Telehealth: Payer: Self-pay | Admitting: *Deleted

## 2016-10-18 ENCOUNTER — Ambulatory Visit
Admit: 2016-10-18 | Discharge: 2016-10-18 | Disposition: A | Discharge status: Home / Self Care | Payer: Medicaid Other | Attending: Radiation Oncology | Admitting: Radiation Oncology

## 2016-10-18 DIAGNOSIS — T402X5A Adverse effect of other opioids, initial encounter: Secondary | ICD-10-CM

## 2016-10-18 DIAGNOSIS — M545 Low back pain: Secondary | ICD-10-CM

## 2016-10-18 DIAGNOSIS — K5903 Drug induced constipation: Secondary | ICD-10-CM

## 2016-10-18 MED ORDER — OXYCODONE HCL 5 MG PO TABS
30.0000 mg | ORAL_TABLET | ORAL | Status: DC | PRN
  Administered 2016-10-18 – 2016-10-20 (×10): 30 mg via ORAL
  Filled 2016-10-18 (×11): qty 6

## 2016-10-18 NOTE — Progress Notes (Signed)
    Durable Medical Equipment        Start     Ordered   10/18/16 1115  For home use only DME standard manual wheelchair with seat cushion  Once    Comments:  Patient suffers from metastatic cancer to the spine which impairs their ability to perform daily activities like walking and bathing in the home.  A walker will not resolve  issue with performing activities of daily living. A wheelchair will allow patient to safely perform daily activities. Patient can safely propel the wheelchair in the home or has a caregiver who can provide assistance.  Accessories: elevating leg rests (ELRs), wheel locks, extensions and anti-tippers.   10/18/16 1116   10/18/16 1114  For home use only DME 3 n 1  Once     10/18/16 1116

## 2016-10-18 NOTE — Progress Notes (Signed)
This CM checked back in with pt about home health services. Pt agrees to home health and when choice offered, [facility] chosen. Pt unable to receive HHPT due to having Medicaid but can have [place] for safety eval. Will need MD order. Pt requesting 3in1 and wheelchair. Orders received and [facility] alerted of referral and DME needs. Marney Doctor RN,BSN,NCM [phone]

## 2016-10-18 NOTE — Progress Notes (Signed)
Patient Demographics:    Edgar Ross, is a 66 y.o. male, DOB - 05/20/1950, JJK:[id number]  Admit date - 10/05/2016   Admitting Physician Robbie Lis, MD  Outpatient Primary MD for the patient is Patient, No Pcp Per  LOS - 13   No chief complaint on file.       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  The patient underwent an L3 corpectomy and fixation from L2 to S1 bilaterally on 05/28/2016. Dr. Cyndy Freeze has seen pt on follow up in April 2018 and has noted that hardware has changed in position since placement. Dr. Cyndy Freeze explained that while pain is most certainly caused by metastatic  cancer, hardware could be the culprit as well but surgery to fix this would be high risk and GOC would needed to be addressed before proceeding. Completed CT  Simulation on 10/18/16...     1)Low back pain-  Completed CT  Simulation on 10/18/16 for possible radiation, unfortunately this is liked due to metastatic prostate cancer,  oncology, Dr Benay Spice has discussion chemo ad radiation with the patient but the patient is still hesitant to accept these options and believes the hardware is the culprit of his pain. - Dr Cyndy Freeze has seen him in consultation and has explained that he is not a surgical candidate. Overall pain control is better, Dr Domingo Cocking adjusted his opiates .   He is in agreement with radiation and with chemo once his pain comes under control.   2)Dispo- - He is  refusing to go to a SNF and plans to go home.- he is now beginning to ambulate to the bathroom and has been working with PT  3)Colon cancer - 2017- - with metastasis to L3, sp L3 corpectomy 1/18 - PET >> multiple hypermetabolic bone (B71, L1, left iliac) liver mets and axillary lymph nodes - s/p palliative radiation to lumbar spine but needs further radiation- due to ongoing back pain, h   4)H/o non-small cell lung cancer- adenocarcinoma- - Aug 2014- LUL lobectomy  DVT prophylaxis: Lovenox Code Status: Full code Family Communication:  Disposition Plan: home when pain controlled - he is refusing SNF Consultants:   Oncology, palliative care and PT    Lab Results  Component Value Date   PLT 151 10/08/2016    Inpatient Medications  Scheduled Meds: . bisacodyl  10 mg Rectal Once  . dexamethasone  8 mg Intravenous Q12H  . levothyroxine  137 mcg Oral QAC breakfast  . LORazepam  1 mg Oral Once  . morphine  160 mg Oral Q12H  . pantoprazole  40 mg Oral Daily  . polyethylene glycol  17 g Oral Daily  . senna-docusate  1 tablet Oral BID  . tamsulosin  0.4 mg  Oral QPC supper   Continuous Infusions: PRN Meds:.cyclobenzaprine, diazepam, gadobenate dimeglumine, HYDROmorphone (DILAUDID) injection, ondansetron **OR** ondansetron (ZOFRAN) IV, oxyCODONE    Anti-infectives    None        Objective:   Vitals:   10/17/16 0559 10/17/16 1638 10/17/16 2234 10/18/16 0528  BP: 136/77 (!) 133/99 (!) 134/91 133/90  Pulse: 80 86 70 71  Resp: 20 20 18 18   Temp: 98.9 F (37.2 C) 98 F (36.7 C) 97.8 F (36.6 C) 98.3 F (36.8 C)  TempSrc: Oral Oral Oral Oral  SpO2: 98% 99% 100% 100%  Weight:      Height:        Wt Readings from Last 3 Encounters:  10/05/16 67.4 kg (148 lb 9.4 oz)  10/05/16 66 kg (145 lb 8 oz)  09/28/16 62 kg (136 lb 9.6 oz)     Intake/Output Summary (Last 24 hours) at 10/18/16 1705 Last data filed at 10/18/16 1358  Gross per 24 hour  Intake              240 ml   Output             1150 ml  Net             -910 ml     Physical Exam  Gen:- Awake Alert,  In no apparent distress  HEENT:- [place].AT, No sclera icterus Neck-Supple Neck,No JVD,.  Lungs-  CTAB  CV- S1, S2 normal Abd-  +ve B.Sounds, Abd Soft, No tenderness,    Extremity/Skin:- No  edema,    Back - pain with range of motion    Data Review:   Micro Results No results found for this or any previous visit (from the past 240 hour(s)).  Radiology Reports Ct Lumbar Spine Wo Contrast  Result Date: 10/09/2016 CLINICAL DATA:  Lumbar spine pain.  Metastatic colon cancer. EXAM: CT LUMBAR SPINE WITHOUT CONTRAST TECHNIQUE: Multidetector CT imaging of the lumbar spine was performed without intravenous contrast administration. Multiplanar CT image reconstructions were also generated. COMPARISON:  CT abdomen pelvis 09/05/2016 FINDINGS: Segmentation: Normal Alignment: The spacer device extending from L2-L4 is in unchanged position with persistent anterior angulation at its inferior aspect. Alignment of the lumbar spine is otherwise nearly normal. Vertebrae: There are large lucent lesions in the T11 and T12 vertebral bodies and in the L1 spinous process. There is near-total lucency of the L3 vertebral body. There is a lytic lesion within posterior left iliac bone. There is lumbosacral fusion hardware extending from L2-S1. There are bilateral transpedicular screws at L2, L4, L5 and S1. There is lucency surrounding both L1 screws, consistent with loosening. Mild lucency surrounding the right S1 screw. No other evidence of screw loosening. Paraspinal and other soft tissues: Aortic atherosclerosis. There is a posterior paraspinal fluid collection that extends from L2 to sacrum. Disc levels: T10-T11: There is soft tissue mass invading the right neural foramen from the T11 lesion. T11-T12: The T11 vertebral body is mostly replaced by a metastatic lesion. No spinal canal or neural foraminal stenosis. I do not see  extension of the mass into the neural foramina at this level. T12-L1: There is a soft tissue lesion within the spinal is process of L1 with associated extension into the neural arch and dorsal epidural disease. This results in at least moderate stenosis of the thecal sac. The neural foramina are patent. L1-L2: Disc degeneration is mild at this level. No spinal canal or foraminal stenosis. L2-L3: Streak artifact from hardware obscures most of  this level. L3-L4: Streak artifact from hardware also obscures this level. L4-L5: There is posterior decompression with patency of the thecal sac. The neural foramina are patent. L5-S1: Limited assessment due to streak artifact from hardware. No visible spinal canal or neural foraminal stenosis. Visualized sacrum: Normal. IMPRESSION: 1. Metastatic lesion of the T11 vertebral body with extension into the right neural foramen at T10-T11. 2. Metastatic lesion of the L1 spinous process and posterior neural arch with associated dorsal epidural disease moderately narrowing the thecal sac. 3. Lucency surrounding both L2 screws and, to a lesser extent the right S1 screw is consistent with loosening. Persistent angulation of the spacer device extending from L2-L4. 4. Posterior left iliac bone metastatic lesion. 5. Aortic atherosclerosis. Electronically Signed   By: Ulyses Jarred M.D.   On: 10/09/2016 19:00     CBC No results for input(s): WBC, HGB, HCT, PLT, MCV, MCH, MCHC, RDW, LYMPHSABS, MONOABS, EOSABS, BASOSABS, BANDABS in the last 168 hours.  Invalid input(s): NEUTRABS, BANDSABD  Chemistries  No results for input(s): NA, K, CL, CO2, GLUCOSE, BUN, CREATININE, CALCIUM, MG, AST, ALT, ALKPHOS, BILITOT in the last 168 hours.  Invalid input(s): GFRCGP ------------------------------------------------------------------------------------------------------------------ No results for input(s): CHOL, HDL, LDLCALC, TRIG, CHOLHDL, LDLDIRECT in the last 72 hours.  No results found  for: HGBA1C ------------------------------------------------------------------------------------------------------------------ No results for input(s): TSH, T4TOTAL, T3FREE, THYROIDAB in the last 72 hours.  Invalid input(s): FREET3 ------------------------------------------------------------------------------------------------------------------ No results for input(s): VITAMINB12, FOLATE, FERRITIN, TIBC, IRON, RETICCTPCT in the last 72 hours.  Coagulation profile No results for input(s): INR, PROTIME in the last 168 hours.  No results for input(s): DDIMER in the last 72 hours.  Cardiac Enzymes No results for input(s): CKMB, TROPONINI, MYOGLOBIN in the last 168 hours.  Invalid input(s): CK ------------------------------------------------------------------------------------------------------------------ No results found for: BNP   Yosselyn Tax M.D on 10/18/2016 at 5:05 PM  Between 7am to 7pm - Pager - [phone]  After 7pm go to [email or web address] - password [email or web address]  [facility] -  Office  [phone]   Voice Recognition Viviann Spare dictation system was used to create this note, attempts have been made to correct errors. Please contact the author with questions and/or clarifications.

## 2016-10-18 NOTE — Progress Notes (Signed)
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  Attempts being made to determine if Hardware or metastatic disease is cause of severe back pain. MD has stated he is unable to stabiilize spine at this point. He wants to go home but knows he needs to be able to walk     PT Comments    Treatment focused on education regarding his new w/c that is in room.  Pt educated on various aspects of w/c and how to use it safely. Pt was able to transfer with arm rest removed via lateral scoot transfer min/guard, but with poor recall of arm rest management.  PT also adjusted length of leg rests for improved pt positioning.  Pt is refusing SNF and per notes does not qualify for HHPT. Pt reports he will have family A him at home, but unsure if this is accurate.  Follow Up Recommendations  SNF;Other (comment);Supervision for mobility/OOB (refusing SNF)     Equipment Recommendations  Hospital bed (w/c in room)    Recommendations for Other Services       Precautions / Restrictions Precautions Precautions: Fall Restrictions Weight Bearing Restrictions: No    Mobility  Bed Mobility Overal bed mobility: Needs Assistance Bed Mobility: Supine to Sit     Supine to sit: Supervision     General bed mobility comments: Pt comes to long sitting and then brings legs over. Pt not interested in education on rolling.    Transfers Overall transfer level: Needs assistance Equipment used: None Transfers: Lateral/Scoot Transfers          Lateral/Scoot Transfers: Min guard General transfer comment: Pt transferred to the R to new w/c with arm rest removed.  Ambulation/Gait                 Stairs            Wheelchair Mobility    Modified Rankin (Stroke Patients  Only)       Balance   Sitting-balance support: Bilateral upper extremity supported;Feet supported Sitting balance-Leahy Scale: Fair Sitting balance - Comments: requires UE support due to back pain                                    Cognition Arousal/Alertness: Awake/alert Behavior During Therapy: WFL for tasks assessed/performed Overall Cognitive Status: Within Functional Limits for tasks assessed                                        Exercises      General Comments General comments (skin integrity, edema, etc.): Pt was oriented to the w/c including education on use of removable arm rests, elevating leg rests and how they can be removed and swung away, and use of brakes.  Pt educated on safety with w/c and also extended leg rests for increased pt comfort.  Pt needing further training once up in recliner on removable arm rests.      Pertinent Vitals/Pain Pain Assessment: 0-10 Pain Score: 5  Pain Location: back  Pain Descriptors / Indicators: Spasm Pain Intervention(s): Monitored during session;Repositioned;Premedicated before session    Home Living  Prior Function            PT Goals (current goals can now be found in the care plan section) Acute Rehab PT Goals Patient Stated Goal: to  be able to walk PT Goal Formulation: With patient Time For Goal Achievement: 10/22/16 Potential to Achieve Goals: Fair Progress towards PT goals: Progressing toward goals    Frequency    Min 3X/week      PT Plan Current plan remains appropriate    Co-evaluation              AM-PAC PT "6 Clicks" Daily Activity  Outcome Measure  Difficulty turning over in bed (including adjusting bedclothes, sheets and blankets)?: A Lot Difficulty moving from lying on back to sitting on the side of the bed? : A Lot Difficulty sitting down on and standing up from a chair with arms (e.g., wheelchair, bedside commode, etc,.)?:  Total Help needed moving to and from a bed to chair (including a wheelchair)?: A Lot Help needed walking in hospital room?: Total Help needed climbing 3-5 steps with a railing? : Total 6 Click Score: 9    End of Session   Activity Tolerance: Patient limited by pain Patient left: in chair;with call bell/phone within reach Nurse Communication: Mobility status;Other (comment) (discussed pre-medicating prior to session) PT Visit Diagnosis: Other abnormalities of gait and mobility (R26.89);Muscle weakness (generalized) (M62.81)     Time: 2951-8841 PT Time Calculation (min) (ACUTE ONLY): 39 min  Charges:  $Therapeutic Activity: 8-22 mins $Wheel Chair Management: 23-37 mins                    G Codes:       Belia Febo L. Tamala Julian, [place] Pager [phone] 10/18/2016    Galen Manila 10/18/2016, 4:08 PM

## 2016-10-18 NOTE — Progress Notes (Signed)
Daily Progress Note   Patient Name: Edgar Ross       Date: 10/18/2016 DOB: [birth year]  Age: 65 y.o. MRN#: [id number] Attending Physician: Roxan Hockey, MD Primary Care Physician: Patient, No Pcp Per Admit Date: 10/05/2016  Reason for Consultation/Follow-up: Establishing goals of care and pain control.   Subjective:  Edgar Ross states that his pain is better controlled today, overall feeling better and was able to complete CT sim today, see discussions below    Length of Stay: 13  Current Medications: Scheduled Meds:  . bisacodyl  10 mg Rectal Once  . dexamethasone  8 mg Intravenous Q12H  . levothyroxine  137 mcg Oral QAC breakfast  . LORazepam  1 mg Oral Once  . morphine  160 mg Oral Q12H  . pantoprazole  40 mg Oral Daily  . polyethylene glycol  17 g Oral Daily  . senna-docusate  1 tablet Oral BID  . tamsulosin  0.4 mg Oral QPC supper    Continuous Infusions:   PRN Meds: cyclobenzaprine, diazepam, gadobenate dimeglumine, HYDROmorphone (DILAUDID) injection, ondansetron **OR** ondansetron (ZOFRAN) IV, oxyCODONE  Physical Exam         Awake alert Sitting up in bed Regular Clear Abdomen soft  No edema Non focal   Vital Signs: BP 133/90 (BP Location: Left Arm)   Pulse 71   Temp 98.3 F (36.8 C) (Oral)   Resp 18   Ht 6\' 4"  (1.93 m)   Wt 67.4 kg (148 lb 9.4 oz)   SpO2 100%   BMI 18.09 kg/m  SpO2: SpO2: 100 % O2 Device: O2 Device: Not Delivered O2 Flow Rate:    Intake/output summary:   Intake/Output Summary (Last 24 hours) at 10/18/16 1245 Last data filed at 10/18/16 1030  Gross per 24 hour  Intake              480 ml  Output             1650 ml  Net            -1170 ml   LBM: Last BM Date: 10/16/16 Baseline Weight: Weight: 67.4 kg (148 lb 9.4 oz) Most  recent weight: Weight: 67.4 kg (148 lb 9.4 oz)       Palliative Assessment/Data:    Flowsheet Rows     Most Recent  Value  Intake Tab  Referral Department  Hospitalist  Unit at Time of Referral  Oncology Unit  Palliative Care Primary Diagnosis  Cancer  Date Notified  10/13/16  Palliative Care Type  New Palliative care  Reason for referral  Pain, Clarify Goals of Care  Date of Admission  10/05/16  Date first seen by Palliative Care  10/14/16  # of days Palliative referral response time  1 Day(s)  # of days IP prior to Palliative referral  8  Clinical Assessment  Psychosocial & Spiritual Assessment  Palliative Care Outcomes      Patient Active Problem List   Diagnosis Date Noted  . Goals of care, counseling/discussion   . Encounter for palliative care   . Bone metastasis ([place]) 09/29/2016  . Colon cancer ([place]) 08/19/2016  . Lumbar pain 08/19/2016  . Hypertension 08/19/2016  . Hypothyroidism 08/19/2016  . Lipoma of buttock 04/10/2013  . Lung cancer, Left upper lobe 12/04/2012  . Lipoma of back 08/06/2012  . Lipoma of lower extremity 08/06/2012  . Multiple thyroid nodules 10/26/2011    Palliative Care Assessment & Plan   Patient Profile:  66 y.o. 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  He is not a candidate for further back surgery.  Plan has been for chemotherapy and radiation but he has not been making his appointments with oncology or for port placement.  He has not been able to lie flat for radiation.  Palliative consulted for goals of care and pain management.  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   Recommendations/Plan: Pain:  Reports that pain is better overall last night but still with some periods of uncontrolled pain.  In the last 24 hours, he has had 1mg  x 4 doses of IV dilaudid (80mg  oral morphine equivalent), oxycodone 30  mg x 3 doses (120mg  oral morphine equivalent) and MS Contin 1mg  x 2 doses (320mg  oral morphine equivalent) for a total 24 hour oral morphine equivalent of 520mg .  He reports that he thinks higher dose of oxycodone has been more effective, but it doesn't always last until next dose due. Will increase frequency of oxycodone to every 3 hours.  Discussed with Edgar. Ross and his RN about plan to focus on oral rescue meds and IV meds only to be given if he still has insufficient pain relief 40 minutes after getting rescue oxycodone.  Constipation, opioid related: Reports last BM yesterday. Continue miralax and senna.  Continue to modify bowel regimen as needed.  Advanced directives discussions: Edgar Ross does not have a spouse, he has his mother, siblings and 2 daughters, one daughter lives in [place], one daughter lives in [place]. He elects his mother and his daughter from [place] to make decisions on his behalf if he is ever not able to make his own decisions. Plan for chaplain follow up to see if he is interested in completing health care power of attorney paperwork.   Goals of care: Edgar Ross wishes to continue with full code status, is not interested in hospice.  Code Status:    Code Status Orders        Start     Ordered   10/05/16 2246  Full code  Continuous     10/05/16 2247    Code Status History    Date Active Date Inactive Code Status Order ID Comments User Context  08/19/2016  3:47 PM 08/20/2016  5:16 PM Full Code [id number]  Rondel Jumbo, PA-C ED   02/14/2013 12:14 PM 02/15/2013 12:06 PM Full Code [id number]  [name], MD Inpatient       Prognosis:   guarded.    Discharge Planning:  To Be Determined  Care plan was discussed with  Patient, RN   Thank you for allowing the Palliative Medicine Team to assist in the care of this patient.  Total time: 40 minutes    Greater than 50%  of this time was spent counseling and coordinating care related to the above assessment and  plan.  Micheline Rough, MD [facility] Team [phone]  Please contact Palliative Medicine Team phone at [phone] for questions and concerns.

## 2016-10-18 NOTE — Progress Notes (Signed)
   10/18/16 1200  Clinical Encounter Type  Visited With Patient  Visit Type Initial;Psychological support;Spiritual support  Referral From Physician  Consult/Referral To Chaplain  Spiritual Encounters  Spiritual Needs Emotional;Other (Comment) (Pastoral Conversation/Support)  Stress Factors  Patient Stress Factors Major life changes;Health changes  Advance Directives (For Healthcare)  Does Patient Have a Medical Advance Directive? No  Would patient like information on creating a medical advance directive? No - Patient declined   Initial reason for visit was for consult for Advance Directive. The patient declined. Stated that he is in pain today and talked about how he has good support from his family.   Will follow-up.  W[place] M.Div.

## 2016-10-18 NOTE — Telephone Encounter (Signed)
Called and spoke with RN Verline Lema? [facility] room 1336, asked if patient still coming for his ct simulation at 9:30 am today, "Yes, patient was just given Dilaudid  IV and valium po," asked how long procedure will take, told RN about 1 hour hopefully if patient can lay still for the ct simulation, thanked Therapist, sports, gave updated status to Kristeen Miss and [facility] radiation  therapists 9:23 AM

## 2016-10-19 ENCOUNTER — Ambulatory Visit
Admit: 2016-10-19 | Discharge: 2016-10-19 | Disposition: A | Discharge status: Home / Self Care | Payer: Medicaid Other | Attending: Radiation Oncology | Admitting: Radiation Oncology

## 2016-10-19 ENCOUNTER — Encounter: Payer: Self-pay | Admitting: *Deleted

## 2016-10-19 ENCOUNTER — Ambulatory Visit: Payer: Medicaid Other

## 2016-10-19 MED ORDER — HYDROMORPHONE HCL 1 MG/ML IJ SOLN
1.0000 mg | Freq: Once | INTRAMUSCULAR | Status: AC
  Administered 2016-10-19: 1 mg via INTRAVENOUS
  Filled 2016-10-19: qty 1

## 2016-10-19 NOTE — Progress Notes (Signed)
                                                                                                                                                                                                         Daily Progress Note   Patient Name: Edgar Ross       Date: 10/19/2016 DOB: 08/26/1950  Age: 66 y.o. MRN#: [id number] Attending Physician: Emokpae, Courage, MD Primary Care Physician: Patient, No Pcp Per Admit Date: 10/05/2016  Reason for Consultation/Follow-up: Establishing goals of care and pain control.   Subjective: I met this AM with Edgar Ross  In conjunction with SW and CM.  We discussed plan for discharge as well as pain regimen.   see discussions below    Length of Stay: 14  Current Medications: Scheduled Meds:  . bisacodyl  10 mg Rectal Once  . dexamethasone  8 mg Intravenous Q12H  . levothyroxine  137 mcg Oral QAC breakfast  . LORazepam  1 mg Oral Once  . morphine  160 mg Oral Q12H  . pantoprazole  40 mg Oral Daily  . polyethylene glycol  17 g Oral Daily  . senna-docusate  1 tablet Oral BID  . tamsulosin  0.4 mg Oral QPC supper    Continuous Infusions:   PRN Meds: cyclobenzaprine, diazepam, gadobenate dimeglumine, HYDROmorphone (DILAUDID) injection, ondansetron **OR** ondansetron (ZOFRAN) IV, oxyCODONE  Physical Exam         Awake alert Sitting up in bed Regular Clear Abdomen soft  No edema Non focal   Vital Signs: BP 130/88 (BP Location: Left Arm)   Pulse 92   Temp 98.2 F (36.8 C) (Oral)   Resp 18   Ht 6' 4" (1.93 m)   Wt 67.4 kg (148 lb 9.4 oz)   SpO2 100%   BMI 18.09 kg/m  SpO2: SpO2: 100 % O2 Device: O2 Device: Not Delivered O2 Flow Rate:    Intake/output summary:   Intake/Output Summary (Last 24 hours) at 10/19/16 0949 Last data filed at 10/19/16 0730  Gross per 24 hour  Intake              480 ml  Output              800 ml  Net             -320 ml   LBM: Last BM Date: 10/18/16 Baseline Weight: Weight: 67.4 kg (148 lb 9.4 oz) Most  recent weight: Weight: 67.4 kg (148 lb 9.4 oz)       Palliative Assessment/Data:    Flowsheet   Rows     Most Recent Value  Intake Tab  Referral Department  Hospitalist  Unit at Time of Referral  Oncology Unit  Palliative Care Primary Diagnosis  Cancer  Date Notified  10/13/16  Palliative Care Type  New Palliative care  Reason for referral  Pain, Clarify Goals of Care  Date of Admission  10/05/16  Date first seen by Palliative Care  10/14/16  # of days Palliative referral response time  1 Day(s)  # of days IP prior to Palliative referral  8  Clinical Assessment  Psychosocial & Spiritual Assessment  Palliative Care Outcomes      Patient Active Problem List   Diagnosis Date Noted  . Goals of care, counseling/discussion   . Encounter for palliative care   . Bone metastasis ([place]) 09/29/2016  . Colon cancer ([facility]) 08/19/2016  . Lumbar pain 08/19/2016  . Hypertension 08/19/2016  . Hypothyroidism 08/19/2016  . Lipoma of buttock 04/10/2013  . Lung cancer, Left upper lobe 12/04/2012  . Lipoma of back 08/06/2012  . Lipoma of lower extremity 08/06/2012  . Multiple thyroid nodules 10/26/2011    Palliative Care Assessment & Plan   Patient Profile:  66 y.o. 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  He is not a candidate for further back surgery.  Plan has been for chemotherapy and radiation but he has not been making his appointments with oncology or for port placement.  He has not been able to lie flat for radiation.  Palliative consulted for goals of care and pain management.  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   Recommendations/Plan: Pain:  Reports that pain is better overall but woke up in a lot of pain and requested IV dilaudid.  In the last 24 hours, he has had 27m IV dilaudid (265moral morphine equivalent), oxycodone 30 mg x 5 doses  (2251mral morphine equivalent) and MS Contin 1mg83m2 doses (320mg26ml morphine equivalent) for a total 24 hour oral morphine equivalent of 565mg.23mile he may need another increase in his long acting medication, I would recommend he discharge on current regimen and use rescue medication more frequently as his needs will likely be different at home than they are in the hospital.  I have asked him to keep track of his rescue medication dosing and take this log to his next oncology/radiation oncology appointment to discuss if he needs further titration of his long acting medication.  I would also recommend continuation of steroids on discharge as I am sure rad onc or oncology can begin to taper these on follow-up.    Constipation, opioid related: Reports last BM yesterday. Continue miralax and senna.  Continue to modify bowel regimen as needed.  Advanced directives discussions: Edgar Stankey Paulsennot have a spouse, he has his mother, siblings and 2 daughters, [place] daughter [place] and [place] daughter from [place] decisions on his behalf if he is ever not able to make his own decisions. Plan for chaplain follow up to see if he is interested in completing health care power of attorney paperwork.   Goals of care: Edgar Hearld Ahlerss to continue with full code status, is not interested in hospice.   Disposition: I met with Edgar. [place]alin [place] with social work and care management.  We discussed  recommendation for SNF placement on discharge to help with strengthening through rehab and to ensure transportation to radiation.  We discussed concern for falls at home and concern that he is not going to have the support that he needs (he lives with his mother who is elderly and has her own medical problems).  He is concerned about getting home to his mother and reports that he has family who will be able to help care for him (mentioned his brother  specifically).  He refused SNF placement.  We discussed plan for SW follow-up at home as there remains a high concern for his safety and him getting the care he needs there.  He clearly verbalized understanding this, and he states he wants to be discharged home despite these risks.  I feel that he has capacity to make this decision and understands concerns that his care team have verbalized.   Code Status:    Code Status Orders        Start     Ordered   10/05/16 2246  Full code  Continuous     10/05/16 2247    Code Status History    Date Active Date Inactive Code Status Order ID Comments User Context   08/19/2016  3:47 PM 08/20/2016  5:16 PM Full Code [id number]  Rondel Jumbo, PA-C ED   02/14/2013 12:14 PM 02/15/2013 12:06 PM Full Code [id number]  [name], MD Inpatient       Prognosis:   guarded.    Discharge Planning:  To Be Determined  Care plan was discussed with  Patient, RN, CSW, CM   Thank you for allowing the Palliative Medicine Team to assist in the care of this patient.  Total time: 40 minutes    Greater than 50%  of this time was spent counseling and coordinating care related to the above assessment and plan.  Micheline Rough, MD [place] Team [phone]  Please contact Palliative Medicine Team phone at [phone] for questions and concerns.

## 2016-10-19 NOTE — Progress Notes (Signed)
1640 Spoke with Maggie RN caring for Edgar Ross she plans to medicate him for pain with Dilaudid and give him Valium before he comes down for his 1745 procedure.

## 2016-10-19 NOTE — Progress Notes (Signed)
This CM met with pt, PMT MD and CSW at bedside for discharge planning. We discussed with pt concerns about pt returning home instead of a short term SNF stay. Pt adamant that he wants to go home. He states that he has family to assist him at home and to drive him to radiation appointments. Concern was voiced to pt that this may not be a very safe option for him and pt is still insistent on going home. Home health services (RN,CSW, and aide) have been set up for pt. Marney Doctor RN,BSN,NCM [phone]

## 2016-10-19 NOTE — Progress Notes (Signed)
Patient Demographics:    Edgar Ross, is a 66 y.o. male, DOB - 1950-05-26, BVQ:[id number]  Admit date - 10/05/2016   Admitting Physician Robbie Lis, MD  Outpatient Primary MD for the patient is Patient, No Pcp Per  LOS - 14   No chief complaint on file.       Subjective:    Edgar Ross today has no fevers, no emesis,  No chest pain,  Back pain is better   Assessment  & Plan :    Principal Problem:   Lumbar pain Active Problems:   Colon cancer (HCC)   Bone metastasis ([facility])   Encounter for palliative care   Goals of care, counseling/discussion  Brief Narrative: 66 yo male with history of colon cancer with metastasis to the spine (s/p polypectomy in 2017), admitted for lower back pain despite being started on MS Contin and hydrocodone. The patient underwent an L3 corpectomy and fixation from L2 to S1 bilaterally on 05/28/2016. Dr. Cyndy Freeze has seen pt on follow up in April 2018 and has noted that hardware has changed in position since placement. Dr. Cyndy Freeze explained that while pain is most certainly caused by metastatic cancer, hardware could be the culprit as well but surgery to fix this would be high risk and GOC would needed to be addressed before proceeding. Completed CT  Simulation on 10/18/16...    1)Low Back Pain-  Completed CT Simulation on 10/18/16 for Possible Radiation, unfortunately this is likely due to metastatic prostate cancer,  Dr Benay Spice (oncology) had discussion about chemo and radiation with the patient but the patient is still hesitant to accept these options and believes the hardware is the culprit of his pain. - Dr Cyndy Freeze has seen him in consultation and has explained that he is NOT a surgical candidate. Overall pain control is better, Dr Domingo Cocking adjusted his opiates .  He is in agreement with radiation and with chemo once his pain comes under control.   2)Disposition-  He is  refusing to go to SNF and plans to go home.  He is  ambulating to the bathroom and has been working with PT. patient is requesting discharge home with home health services on 10/20/2016.  Despite repeated advice to go to skilled nursing facility due to concerns about falls and safety at home, patient is adamant that he wants to go home, please see dictated note from Dr. Domingo Cocking dated 10/19/2016  3)Colon cancer - 2017- - with metastasis to L3, sp L3 corpectomy 1/18 - PET >> multiple hypermetabolic bone (C00, L1, left iliac) liver mets and axillary lymph nodes - s/p palliative radiation to lumbar spine but needs further radiation- due to ongoing back pain, h   4)H/o non-small cell lung cancer- adenocarcinoma- - Aug 2014- LUL lobectomy   DVT prophylaxis:Lovenox Code Status:Full code Family Communication: Disposition Plan:home when pain controlled - he is refusing SNF Consultants:  Oncology, palliative care and PT   Lab Results  Component Value Date   PLT 151 10/08/2016    Inpatient Medications  Scheduled Meds: . bisacodyl  10 mg Rectal Once  . dexamethasone  8 mg Intravenous Q12H  . levothyroxine  137 mcg Oral QAC breakfast  . LORazepam  1 mg Oral Once  . morphine  160 mg Oral Q12H  . pantoprazole  40 mg Oral Daily  . polyethylene glycol  17 g Oral Daily  . senna-docusate  1 tablet Oral BID  . tamsulosin  0.4 mg Oral QPC supper   Continuous Infusions: PRN Meds:.cyclobenzaprine, diazepam, gadobenate dimeglumine, HYDROmorphone (DILAUDID) injection, ondansetron **OR** ondansetron (ZOFRAN) IV, oxyCODONE    Anti-infectives    None        Objective:   Vitals:   10/18/16 0528 10/18/16 1430 10/18/16 2113 10/19/16 0621  BP: 133/90 127/85 (!) 136/100 130/88  Pulse: 71 73 (!) 104 92  Resp: 18 18 18 18   Temp: 98.3 F (36.8 C) 98.1 F (36.7 C) 98.4 F (36.9 C) 98.2 F (36.8 C)  TempSrc: Oral Oral Oral Oral  SpO2: 100% 98% 98% 100%  Weight:      Height:         Wt Readings from Last 3 Encounters:  10/05/16 67.4 kg (148 lb 9.4 oz)  10/05/16 66 kg (145 lb 8 oz)  09/28/16 62 kg (136 lb 9.6 oz)     Intake/Output Summary (Last 24 hours) at 10/19/16 1315 Last data filed at 10/19/16 0730  Gross per 24 hour  Intake              240 ml  Output              800 ml  Net             -560 ml     Physical Exam  Gen:- Awake Alert,  In no apparent distress  HEENT:- [place].AT, No sclera icterus Neck-Supple Neck,No JVD,.  Lungs-  CTAB  CV- S1, S2 normal Abd-  +ve B.Sounds, Abd Soft, No tenderness,    Extremity/Skin:- No  edema,    Back - back pain with positional change in activity    Data Review:   Micro Results No results found for this or any previous visit (from the past 240 hour(s)).  Radiology Reports Ct Lumbar Spine Wo Contrast  Result Date: 10/09/2016 CLINICAL DATA:  Lumbar spine pain.  Metastatic colon cancer. EXAM: CT LUMBAR SPINE WITHOUT CONTRAST TECHNIQUE: Multidetector CT imaging of the lumbar spine was performed without intravenous contrast administration. Multiplanar CT image reconstructions were also generated. COMPARISON:  CT abdomen pelvis 09/05/2016 FINDINGS: Segmentation: Normal Alignment: The spacer device extending from L2-L4 is in unchanged position with persistent anterior angulation at its inferior aspect. Alignment of the lumbar spine is otherwise nearly normal. Vertebrae: There are large lucent lesions in the T11 and T12 vertebral bodies and in the L1 spinous process. There is near-total lucency of the L3 vertebral body. There is a lytic lesion within posterior left iliac bone. There is lumbosacral fusion hardware extending from L2-S1. There are bilateral transpedicular screws at L2, L4, L5 and S1. There is lucency surrounding both L1 screws, consistent with loosening. Mild lucency surrounding the right S1 screw. No other evidence of screw loosening. Paraspinal and other soft tissues: Aortic atherosclerosis. There is a  posterior paraspinal fluid collection that extends from L2 to sacrum. Disc levels: T10-T11: There is soft tissue mass invading the right neural foramen from the T11 lesion. T11-T12: The T11 vertebral body is mostly replaced by a metastatic lesion. No spinal canal or neural foraminal stenosis. I do not see extension of the mass into the neural foramina at this level. T12-L1: There is a soft tissue lesion within the spinal is process of L1 with associated extension into the neural arch and dorsal epidural disease.  This results in at least moderate stenosis of the thecal sac. The neural foramina are patent. L1-L2: Disc degeneration is mild at this level. No spinal canal or foraminal stenosis. L2-L3: Streak artifact from hardware obscures most of this level. L3-L4: Streak artifact from hardware also obscures this level. L4-L5: There is posterior decompression with patency of the thecal sac. The neural foramina are patent. L5-S1: Limited assessment due to streak artifact from hardware. No visible spinal canal or neural foraminal stenosis. Visualized sacrum: Normal. IMPRESSION: 1. Metastatic lesion of the T11 vertebral body with extension into the right neural foramen at T10-T11. 2. Metastatic lesion of the L1 spinous process and posterior neural arch with associated dorsal epidural disease moderately narrowing the thecal sac. 3. Lucency surrounding both L2 screws and, to a lesser extent the right S1 screw is consistent with loosening. Persistent angulation of the spacer device extending from L2-L4. 4. Posterior left iliac bone metastatic lesion. 5. Aortic atherosclerosis. Electronically Signed   By: Ulyses Jarred M.D.   On: 10/09/2016 19:00     CBC No results for input(s): WBC, HGB, HCT, PLT, MCV, MCH, MCHC, RDW, LYMPHSABS, MONOABS, EOSABS, BASOSABS, BANDABS in the last 168 hours.  Invalid input(s): NEUTRABS, BANDSABD  Chemistries  No results for input(s): NA, K, CL, CO2, GLUCOSE, BUN, CREATININE, CALCIUM, MG,  AST, ALT, ALKPHOS, BILITOT in the last 168 hours.  Invalid input(s): GFRCGP ------------------------------------------------------------------------------------------------------------------ No results for input(s): CHOL, HDL, LDLCALC, TRIG, CHOLHDL, LDLDIRECT in the last 72 hours.  No results found for: HGBA1C ------------------------------------------------------------------------------------------------------------------ No results for input(s): TSH, T4TOTAL, T3FREE, THYROIDAB in the last 72 hours.  Invalid input(s): FREET3 ------------------------------------------------------------------------------------------------------------------ No results for input(s): VITAMINB12, FOLATE, FERRITIN, TIBC, IRON, RETICCTPCT in the last 72 hours.  Coagulation profile No results for input(s): INR, PROTIME in the last 168 hours.  No results for input(s): DDIMER in the last 72 hours.  Cardiac Enzymes No results for input(s): CKMB, TROPONINI, MYOGLOBIN in the last 168 hours.  Invalid input(s): CK ------------------------------------------------------------------------------------------------------------------ No results found for: BNP   Aeisha Minarik M.D on 10/19/2016 at 1:15 PM  Between 7am to 7pm - Pager - [phone]  After 7pm go to [email or web address] - password [email or web address]  [facility] -  Office  [phone]   Voice Recognition Viviann Spare dictation system was used to create this note, attempts have been made to correct errors. Please contact the author with questions and/or clarifications.

## 2016-10-20 ENCOUNTER — Ambulatory Visit
Admit: 2016-10-20 | Discharge: 2016-10-20 | Disposition: A | Discharge status: Home / Self Care | Payer: Medicaid Other | Attending: Radiation Oncology | Admitting: Radiation Oncology

## 2016-10-20 ENCOUNTER — Telehealth: Payer: Self-pay | Admitting: *Deleted

## 2016-10-20 ENCOUNTER — Ambulatory Visit: Payer: Medicaid Other

## 2016-10-20 LAB — BASIC METABOLIC PANEL
ANION GAP: 8 (ref 5–15)
BUN: 23 mg/dL — ABNORMAL HIGH (ref 6–20)
CO2: 31 mmol/L (ref 22–32)
Calcium: 9.1 mg/dL (ref 8.9–10.3)
Chloride: 96 mmol/L — ABNORMAL LOW (ref 101–111)
Creatinine, Ser: 0.87 mg/dL (ref 0.61–1.24)
GFR calc Af Amer: >60 mL/min (ref >60)
GLUCOSE: 114 mg/dL — AB (ref 65–99)
POTASSIUM: 3.9 mmol/L (ref 3.5–5.1)
Sodium: 135 mmol/L (ref 135–145)

## 2016-10-20 LAB — CBC
HCT: 41.5 % (ref 39.0–52.0)
HEMOGLOBIN: 14.1 g/dL (ref 13.0–17.0)
MCH: 29.3 pg (ref 26.0–34.0)
MCHC: 34 g/dL (ref 30.0–36.0)
MCV: 86.1 fL (ref 78.0–100.0)
PLATELETS: 351 10*3/uL (ref 150–400)
RBC: 4.82 MIL/uL (ref 4.22–5.81)
RDW: 16.4 % — ABNORMAL HIGH (ref 11.5–15.5)
WBC: 19.8 10*3/uL — AB (ref 4.0–10.5)

## 2016-10-20 MED ORDER — ESOMEPRAZOLE MAGNESIUM 40 MG PO CPDR: 40.0000 mg | DELAYED_RELEASE_CAPSULE | Freq: Two times a day (BID) | ORAL | 0 refills | Status: DC

## 2016-10-20 MED ORDER — DEXAMETHASONE 4 MG PO TABS: ORAL_TABLET | ORAL | 0 refills | Status: DC

## 2016-10-20 MED ORDER — MORPHINE SULFATE ER 60 MG PO TBCR: 160.0000 mg | EXTENDED_RELEASE_TABLET | Freq: Two times a day (BID) | ORAL | 0 refills | Status: DC

## 2016-10-20 MED ORDER — METHOCARBAMOL 750 MG PO TABS: 750.0000 mg | ORAL_TABLET | Freq: Four times a day (QID) | ORAL | 1 refills | Status: DC

## 2016-10-20 MED ORDER — MORPHINE SULFATE ER 60 MG PO TBCR: 60.0000 mg | EXTENDED_RELEASE_TABLET | Freq: Two times a day (BID) | ORAL | 0 refills | Status: DC

## 2016-10-20 MED ORDER — HYDROMORPHONE HCL 4 MG PO TABS: 4.0000 mg | ORAL_TABLET | ORAL | 0 refills | Status: DC | PRN

## 2016-10-20 MED ORDER — ONDANSETRON HCL 4 MG PO TABS: 4.0000 mg | ORAL_TABLET | Freq: Four times a day (QID) | ORAL | 0 refills | Status: DC | PRN

## 2016-10-20 MED ORDER — POLYETHYLENE GLYCOL 3350 17 G PO PACK: 17.0000 g | PACK | Freq: Two times a day (BID) | ORAL | 0 refills | Status: DC

## 2016-10-20 MED ORDER — SENNOSIDES-DOCUSATE SODIUM 8.6-50 MG PO TABS: 1.0000 | ORAL_TABLET | Freq: Two times a day (BID) | ORAL | 0 refills | Status: DC

## 2016-10-20 MED ORDER — OXYCODONE HCL 30 MG PO TABS: 30.0000 mg | ORAL_TABLET | ORAL | 0 refills | Status: DC | PRN

## 2016-10-20 NOTE — Progress Notes (Signed)
PT Cancellation Note  Patient Details Name: Khayree Delellis MRN: [id number] DOB: [birth year]   Cancelled Treatment:    Reason Eval/Treat Not Completed: Other (comment) Pt declines any mobility at this time stating he needs to make some calls.  Pt reports he is still deciding between SNF for rehab or home without therapy.  Will check back as schedule permits.   Yousra Ivens,KATHrine E 10/20/2016, 1:54 PM Carmelia Bake, PT, DPT 10/20/2016 Pager: [phone]
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
## 2016-10-20 NOTE — Telephone Encounter (Signed)
Spoke with Romie Minus, asked to medicate patient patient accordingly  Dilaudid  Please for patient toly on table for rad tx, RN sytated she woul;d

## 2016-10-20 NOTE — Discharge Instructions (Signed)
1)Come to [facility] for radiation treatments Mondays through Fridays, you need 8 more sessions of radiation treatments  2) take medications including pain medications as prescribed  3) follow-up with your oncologist Dr. Benay Spice next week

## 2016-10-23 ENCOUNTER — Encounter (HOSPITAL_COMMUNITY): Payer: Self-pay | Admitting: Emergency Medicine

## 2016-10-23 ENCOUNTER — Ambulatory Visit: Payer: Medicaid Other

## 2016-10-23 ENCOUNTER — Emergency Department (HOSPITAL_COMMUNITY)
Admission: EM | Admit: 2016-10-23 | Discharge: 2016-10-23 | Disposition: A | Discharge status: Home / Self Care | Payer: Medicaid Other | Attending: Emergency Medicine | Admitting: Emergency Medicine

## 2016-10-23 DIAGNOSIS — Z85118 Personal history of other malignant neoplasm of bronchus and lung: Secondary | ICD-10-CM | POA: Diagnosis not present

## 2016-10-23 DIAGNOSIS — I10 Essential (primary) hypertension: Secondary | ICD-10-CM | POA: Diagnosis not present

## 2016-10-23 DIAGNOSIS — M545 Low back pain: Secondary | ICD-10-CM | POA: Insufficient documentation

## 2016-10-23 DIAGNOSIS — F17210 Nicotine dependence, cigarettes, uncomplicated: Secondary | ICD-10-CM | POA: Diagnosis not present

## 2016-10-23 DIAGNOSIS — J45909 Unspecified asthma, uncomplicated: Secondary | ICD-10-CM | POA: Insufficient documentation

## 2016-10-23 DIAGNOSIS — C189 Malignant neoplasm of colon, unspecified: Secondary | ICD-10-CM

## 2016-10-23 DIAGNOSIS — E039 Hypothyroidism, unspecified: Secondary | ICD-10-CM | POA: Diagnosis not present

## 2016-10-23 DIAGNOSIS — G8929 Other chronic pain: Secondary | ICD-10-CM | POA: Diagnosis not present

## 2016-10-23 DIAGNOSIS — Z85038 Personal history of other malignant neoplasm of large intestine: Secondary | ICD-10-CM | POA: Diagnosis not present

## 2016-10-23 MED ORDER — ONDANSETRON HCL 4 MG/2ML IJ SOLN
4.0000 mg | Freq: Once | INTRAMUSCULAR | Status: AC
  Administered 2016-10-23: 4 mg via INTRAVENOUS
  Filled 2016-10-23: qty 2

## 2016-10-23 MED ORDER — HYDROMORPHONE HCL 1 MG/ML IJ SOLN: 1.0000 mg | Freq: Once | INTRAMUSCULAR | Status: DC

## 2016-10-23 MED ORDER — ESOMEPRAZOLE MAGNESIUM 40 MG PO CPDR: 40.0000 mg | DELAYED_RELEASE_CAPSULE | Freq: Two times a day (BID) | ORAL | 0 refills | Status: AC

## 2016-10-23 MED ORDER — SENNOSIDES-DOCUSATE SODIUM 8.6-50 MG PO TABS: 1.0000 | ORAL_TABLET | Freq: Two times a day (BID) | ORAL | 0 refills | Status: DC

## 2016-10-23 MED ORDER — LEVOTHYROXINE SODIUM 137 MCG PO TABS: 137.0000 ug | ORAL_TABLET | Freq: Every day | ORAL | 0 refills | Status: AC

## 2016-10-23 MED ORDER — POLYETHYLENE GLYCOL 3350 17 G PO PACK: 17.0000 g | PACK | Freq: Two times a day (BID) | ORAL | 0 refills | Status: AC

## 2016-10-23 MED ORDER — DEXAMETHASONE 4 MG PO TABS: ORAL_TABLET | ORAL | 0 refills | Status: DC

## 2016-10-23 MED ORDER — HYDROMORPHONE HCL 1 MG/ML IJ SOLN
1.0000 mg | Freq: Once | INTRAMUSCULAR | Status: AC
  Administered 2016-10-23: 1 mg via INTRAVENOUS
  Filled 2016-10-23: qty 1

## 2016-10-23 MED ORDER — TAMSULOSIN HCL 0.4 MG PO CAPS: 0.4000 mg | ORAL_CAPSULE | Freq: Every day | ORAL | 0 refills | Status: AC

## 2016-10-23 MED ORDER — MORPHINE SULFATE ER 60 MG PO TBCR: 160.0000 mg | EXTENDED_RELEASE_TABLET | Freq: Two times a day (BID) | ORAL | 0 refills | Status: DC

## 2016-10-23 MED ORDER — ONDANSETRON HCL 4 MG PO TABS: 4.0000 mg | ORAL_TABLET | Freq: Four times a day (QID) | ORAL | 0 refills | Status: DC | PRN

## 2016-10-23 MED ORDER — METHOCARBAMOL 750 MG PO TABS: 750.0000 mg | ORAL_TABLET | Freq: Four times a day (QID) | ORAL | 1 refills | Status: AC

## 2016-10-23 MED ORDER — OXYCODONE HCL 30 MG PO TABS: 30.0000 mg | ORAL_TABLET | ORAL | 0 refills | Status: DC | PRN

## 2016-10-23 MED ORDER — OXYCODONE HCL 5 MG PO TABS
30.0000 mg | ORAL_TABLET | Freq: Once | ORAL | Status: AC
  Administered 2016-10-23: 30 mg via ORAL
  Filled 2016-10-23: qty 6

## 2016-10-23 NOTE — ED Notes (Signed)
EDP at the bedside.  ?

## 2016-10-23 NOTE — Evaluation (Signed)
Physical Therapy Evaluation Patient Details Name: Edgar Ross MRN: [id number] DOB: [birth year] Today's Date: 10/23/2016   History of Present Illness  66 yo male with history of colon cancer with metastasis to the spine (s/p polypectomy in 2017), admitted for lower back pain. He was recently seen by PT during his recent hospitalization here at [facility] when he was recently discharged home on 10/20/2016. Dr. Benay Spice stated the back pain and spasms is due to his spinal mets and lesions as described on the CT image on 10/09/2016. Since he returned home he states he has been able to do some things and get in his WC , however today the pain was so bad he has not been able to do anything.     Clinical Impression  We are familiar with this patient from his recent hospitalization here at [facility]. Today we were not able to help him mobilize at all. WE attempted bed mobility and assisted with moving his legs, however at this time he was having great back spasms and was unable to allow [place] to continue to try to assist. He was eary kind and wanted to try, however he felt so bad that his pain was so bad. We even attempted to help reposition him to allow for [place] to assist in cleaning him , however any little movement at this time increased his back pain and spasms.   Today our evaluation was very limited, however he did share he had been able to gt up in his [place] some since his discharge on 10/20/2016, but not today. He would benefit from trial ing PT to see if they can assist with some WC , transfer training and bed mobility. He was walking and independent at home  prior to his hospitalization back on 10/08/2016.   On 10/18/2016 with PT he was able to perform lateral scoot tranfers to the [facility] with min guard to supervision assist, and come to long sitting in bed with supervision. At this time we also thought he would be a candidate for skilled nursing, and we still feel this would be best for him today.      Follow Up Recommendations  SNF    Equipment Recommendations       Recommendations for Other Services       Precautions / Restrictions Precautions Precautions: Fall Restrictions Weight Bearing Restrictions: No         Pertinent Vitals/Pain Pain Assessment: 0-10 Pain Score: 10-Worst pain ever Pain Location: back with spasms that grab him and he is unable to move or allow others to help him move at this time  Pain Descriptors / Indicators: Spasm;Burning;Sharp Pain Intervention(s): Limited activity within patient's tolerance    Home Living Family/patient expects to be discharged to:: Skilled nursing facility                                      Cognition Arousal/Alertness: Awake/alert Behavior During Therapy: [facility] for tasks assessed/performed Overall Cognitive Status: Within Functional Limits for tasks assessed                                        General Comments      Exercises     Assessment/Plan    PT Assessment Patient needs continued PT services  PT Problem List Decreased strength;Decreased range of motion;Decreased activity  tolerance;Decreased mobility       PT Treatment Interventions DME instruction;Functional mobility training;Therapeutic activities;Therapeutic exercise;Patient/family education;Wheelchair mobility training    PT Goals (Current goals can be found in the Care Plan section)  Acute Rehab PT Goals Patient Stated Goal: to be able to walk PT Goal Formulation: With patient Time For Goal Achievement: 12/06/16 Potential to Achieve Goals: Fair    Frequency Min 3X/week   Barriers to discharge Decreased caregiver support;Inaccessible home environment lives with elderly mother    Co-evaluation               AM-PAC PT "6 Clicks" Daily Activity  Outcome Measure Difficulty turning over in bed (including adjusting bedclothes, sheets and blankets)?: Total Difficulty moving from lying on back to sitting on the side of the bed? :  Total Difficulty sitting down on and standing up from a chair with arms (e.g., wheelchair, bedside commode, etc,.)?: Total Help needed moving to and from a bed to chair (including a wheelchair)?: Total Help needed walking in hospital room?: Total Help needed climbing 3-5 steps with a railing? : Total 6 Click Score: 6    End of Session   Activity Tolerance: Patient limited by pain Patient left: in bed   PT Visit Diagnosis: Pain    Time: 1700-1720 PT Time Calculation (min) (ACUTE ONLY): 20 min   Charges:   PT Evaluation $PT Eval Low Complexity: 1 Procedure     PT G Codes:   PT G-Codes **NOT FOR INPATIENT CLASS** Functional Assessment Tool Used: AM-PAC 6 Clicks Basic Mobility;Clinical judgement Functional Limitation: Mobility: Walking and moving around Mobility: Walking and Moving Around Current Status (Z2080): At least 80 percent but less than 100 percent impaired, limited or restricted Mobility: Walking and Moving Around Goal Status [id number]): At least 20 percent but less than 40 percent impaired, limited or restricted    Clide Dales, PT Pager: [phone] 10/23/2016   Bricia Taher, Gatha Mayer 10/23/2016, 5:27 PM

## 2016-10-23 NOTE — Progress Notes (Addendum)
CSw met with pt and pt's brother and confirmed pt is willing to sign over his disability check for the month and agree to stay at the SNF for the full 30 days necessary for the facility to be paid by Medicaid.  Pt stated verbally he agrees to the CSW and gave the CSW permission to create an FL-2 and send referrals out to area SNF.  Pt and pt's brother's preferences are [facility], [facility] and [facility].  CSW is creating an FL-2 and sending out referrals now.  Edgar Ross. Edgar Ross, Edgar Ross, CSI Clinical Social Worker Ph: [phone]
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
## 2016-10-23 NOTE — ED Triage Notes (Signed)
Per EMS patient with hx of bone cancer in spine-had first 2 radiation treatments last week.  Patient unable to move due to pain.  Reports in January he had back surgery in [place].  Patient lives at home with mother.  Patient took 30 mg morphine, 30mg  oxycodone and 60mg  morphine prior to EMS arrival.

## 2016-10-23 NOTE — ED Notes (Signed)
[facility] arrived to pick up patient.

## 2016-10-23 NOTE — Progress Notes (Signed)
CSW updated RN and EDP regarding D/C.  Please reconsult if future social work needs arise.  CSW will continue to follow for D/C needs.  Alphonse Guild. Kjersten Ormiston, Reed Pandy, CSI Clinical Social Worker Ph: [phone]

## 2016-10-23 NOTE — Progress Notes (Signed)
CSW spoke with pt's brother who has signed the pt in at [employer].  CSW updated RN and EDP and RN will call for report and call [facility] when pt is ready for D/C. Please reconsult if future social work needs arise.  CSW signing off.  Alphonse Guild. Shamiah Kahler, Reed Pandy, CSI Clinical Social Worker Ph: [phone]

## 2016-10-23 NOTE — ED Notes (Addendum)
[facility] called for transport. Pt. Is discharged and been accepted to [facility]. Report called to [facility] of receiving facility.

## 2016-10-23 NOTE — ED Notes (Signed)
Attempted IV stick x 1 without success before patient demanded IV be removed.  Patient requesting to see PA.  States staff are not treating him properly.  Asked patient if we could clean him up and he refused.

## 2016-10-23 NOTE — ED Notes (Signed)
PT came in info this writer he have been in BM since Friday. PT unable to roll for this Probation officer and staff. PT request for clothes to be cut off due to BM and pain. Once pants and top cut off PT became upset when this writer attempt to clean PT bottom. PT state "hospital" sent him home friday and this time he need to stay. PT refuse to roll to let this writer clean bottom. Request to speak with director. ED charge nurse have been made aware. ED direct at bedside

## 2016-10-23 NOTE — Discharge Instructions (Signed)
Take home medications as previously prescribed. Return to ED for worsening pain, injury, trouble with urination, loss of consciousness, trouble breathing, chest pain.

## 2016-10-23 NOTE — ED Notes (Signed)
Patient cleaned up and put on tried gown and sheets. Patient verbally aggressive and physically aggressive.

## 2016-10-23 NOTE — Progress Notes (Signed)
CSW received a call from [place] at [facility] Pt has been accepted by: [place] Number for report is: [phone] Pt's room/bed number will be: 68 B Accepting physician: SNF MD  Pt can arrive ASAP on 10/23/16  CSW will update RN and EDP.  [facility] requests pt be given his pain meds before D/C  Alphonse Guild. Joyel Chenette, Latanya Presser, LCAS Clinical Social Worker Ph: [phone]

## 2016-10-23 NOTE — NC FL2 (Signed)
[place] LEVEL OF CARE SCREENING TOOL     IDENTIFICATION  Patient Name: Edgar Ross Birthdate: 01-24-1951 Sex: male Admission Date (Current Location): 10/23/2016  [place] and [place] Number:  Kathleen Argue [id number] [place] and Address:  [facility],  [place] [address], [place]      Provider Number: [id number]  Attending Physician Name and Address:  Isla Pence, MD  Relative Name and Phone Number:       Current Level of Care: Hospital Recommended Level of Care: [place] Prior Approval Number:    Date Approved/Denied:   PASRR Number: [id number]  Discharge Plan: Home    Current Diagnoses: Patient Active Problem List   Diagnosis Date Noted  . Goals of care, counseling/discussion   . Encounter for palliative care   . Bone metastasis ([place]) 09/29/2016  . Colon cancer ([place]) 08/19/2016  . Lumbar pain 08/19/2016  . Hypertension 08/19/2016  . Hypothyroidism 08/19/2016  . Lipoma of buttock 04/10/2013  . Lung cancer, Left upper lobe 12/04/2012  . Lipoma of back 08/06/2012  . Lipoma of lower extremity 08/06/2012  . Multiple thyroid nodules 10/26/2011    Orientation RESPIRATION BLADDER Height & Weight     Self, Time, Situation, Place  Normal Continent Weight:   Height:     BEHAVIORAL SYMPTOMS/MOOD NEUROLOGICAL BOWEL NUTRITION STATUS      Continent Diet (Past recommendation for a dysphagia diet due to trouble swallowing)  AMBULATORY STATUS COMMUNICATION OF NEEDS Skin   Extensive Assist Verbally Normal                       Personal Care Assistance Level of Assistance  Bathing, Dressing Bathing Assistance: Limited assistance   Dressing Assistance: Limited assistance     Functional Limitations Info             SPECIAL CARE FACTORS FREQUENCY  PT (By licensed PT), OT (By licensed OT)     PT Frequency: 5 OT Frequency: 5            Contractures Contractures Info: Not present    Additional Factors  Info  Code Status, Allergies Code Status Info: Prior Allergies Info: SUBOXONE BUPRENORPHINE HCL-NALOXONE HCL, MORPHINE RELATED,            Current Medications (10/23/2016):  This is the current hospital active medication list No current facility-administered medications for this encounter.    Current Outpatient Prescriptions  Medication Sig Dispense Refill  . dexamethasone (DECADRON) 4 MG tablet 1 tabs twice a day for 2 days for 10 days (until seen by Dr Benay Spice) 50 tablet 0  . esomeprazole (NEXIUM) 40 MG capsule Take 1 capsule (40 mg total) by mouth 2 (two) times daily before a meal. 60 capsule 0  . levothyroxine (SYNTHROID, LEVOTHROID) 137 MCG tablet Take 1 tablet (137 mcg total) by mouth daily before breakfast. 30 tablet 0  . methocarbamol (ROBAXIN) 750 MG tablet Take 1 tablet (750 mg total) by mouth 4 (four) times daily. 120 tablet 1  . morphine (MS CONTIN) 60 MG 12 hr tablet Take 3 tablets (180 mg total) by mouth every 12 (twelve) hours. 30 tablet 0  . ondansetron (ZOFRAN) 4 MG tablet Take 1 tablet (4 mg total) by mouth every 6 (six) hours as needed for nausea. 20 tablet 0  . oxyCODONE (ROXICODONE) 30 MG immediate release tablet Take 1 tablet (30 mg total) by mouth every 3 (three) hours as needed for moderate pain or severe pain. 30 tablet 0  .  polyethylene glycol (MIRALAX) packet Take 17 g by mouth 2 (two) times daily. 60 each 0  . senna-docusate (SENOKOT-S) 8.6-50 MG tablet Take 1 tablet by mouth 2 (two) times daily. 30 tablet 0  . tamsulosin (FLOMAX) 0.4 MG CAPS capsule Take 1 capsule (0.4 mg total) by mouth daily after supper. 30 capsule 0     Discharge Medications: Please see discharge summary for a list of discharge medications.  Relevant Imaging Results:  Relevant Lab Results:   Additional Information [id number]  Alphonse Guild Yisell Sprunger, LCSWA

## 2016-10-23 NOTE — ED Notes (Signed)
Bed: WA21 Expected date:  Expected time:  Means of arrival:  Comments: EMS 66 y/o generalized pain, cancer pt.

## 2016-10-23 NOTE — Progress Notes (Addendum)
PT is bedside with pt.  Pt's brother Mortimer Fries at ph: [phone] is en route to [facility] to sign pt's paperwork and CSW will call [facility] with PT recommendation when it is completed.    [facility] asks that pt be given his latest pain meds and hard copy of scripts at D/C so pt will be comfortable for the night.   [facility] SN[facility] also asked that if FL-2 meds listed are not active medications that new FL-2 be created with MD's directions listed at bottom of FL-2 saying under meds: SEE AVS.    CSW will check with RN and EDP.  Alphonse Guild. Jetty Berland, Reed Pandy, CSI Clinical Social Worker Ph: [phone]        Alphonse Guild. Thurley Francesconi, Reed Pandy, CSI Clinical Social Worker Ph: [phone]

## 2016-10-23 NOTE — ED Notes (Signed)
Per ED PA patient requesting someone else to start IV.

## 2016-10-23 NOTE — ED Notes (Signed)
Caryl Pina, RN attempted IV stick without success.

## 2016-10-23 NOTE — Progress Notes (Signed)
[facility] ED Pharmacy Tech verified with the CSW that the med list on the FL-2 is consistent to the meds the pt will be D/C ing with.  CSW will verify with RN amd EDP pt will receive his pain meds before D/C'ing to [facility] on 10/23/16.  Alphonse Guild. Tinsleigh Slovacek, Reed Pandy, CSI Clinical Social Worker Ph: [phone]

## 2016-10-24 ENCOUNTER — Telehealth: Payer: Self-pay | Admitting: *Deleted

## 2016-10-24 ENCOUNTER — Ambulatory Visit
Admission: RE | Admit: 2016-10-24 | Discharge: 2016-10-24 | Disposition: A | Discharge status: Home / Self Care | Payer: Medicaid Other | Source: Ambulatory Visit | Attending: Radiation Oncology | Admitting: Radiation Oncology

## 2016-10-24 ENCOUNTER — Ambulatory Visit: Payer: Medicaid Other

## 2016-10-24 ENCOUNTER — Other Ambulatory Visit: Payer: Self-pay | Admitting: *Deleted

## 2016-10-24 VITALS — BP 112/75 | HR 98 | Temp 98.6°F | Resp 20

## 2016-10-24 DIAGNOSIS — Z85118 Personal history of other malignant neoplasm of bronchus and lung: Secondary | ICD-10-CM | POA: Diagnosis not present

## 2016-10-24 DIAGNOSIS — Z885 Allergy status to narcotic agent status: Secondary | ICD-10-CM | POA: Diagnosis not present

## 2016-10-24 DIAGNOSIS — Z8249 Family history of ischemic heart disease and other diseases of the circulatory system: Secondary | ICD-10-CM | POA: Diagnosis not present

## 2016-10-24 DIAGNOSIS — E890 Postprocedural hypothyroidism: Secondary | ICD-10-CM | POA: Diagnosis not present

## 2016-10-24 DIAGNOSIS — Z808 Family history of malignant neoplasm of other organs or systems: Secondary | ICD-10-CM | POA: Diagnosis not present

## 2016-10-24 DIAGNOSIS — M545 Low back pain: Secondary | ICD-10-CM | POA: Diagnosis not present

## 2016-10-24 DIAGNOSIS — Z510 Encounter for antineoplastic radiation therapy: Secondary | ICD-10-CM | POA: Diagnosis not present

## 2016-10-24 DIAGNOSIS — C7951 Secondary malignant neoplasm of bone: Secondary | ICD-10-CM | POA: Diagnosis not present

## 2016-10-24 DIAGNOSIS — F329 Major depressive disorder, single episode, unspecified: Secondary | ICD-10-CM | POA: Diagnosis not present

## 2016-10-24 DIAGNOSIS — Z85038 Personal history of other malignant neoplasm of large intestine: Secondary | ICD-10-CM | POA: Diagnosis not present

## 2016-10-24 DIAGNOSIS — F17210 Nicotine dependence, cigarettes, uncomplicated: Secondary | ICD-10-CM | POA: Diagnosis not present

## 2016-10-24 DIAGNOSIS — G4700 Insomnia, unspecified: Secondary | ICD-10-CM | POA: Diagnosis not present

## 2016-10-24 DIAGNOSIS — Z881 Allergy status to other antibiotic agents status: Secondary | ICD-10-CM | POA: Diagnosis not present

## 2016-10-24 DIAGNOSIS — Z8701 Personal history of pneumonia (recurrent): Secondary | ICD-10-CM | POA: Diagnosis not present

## 2016-10-24 DIAGNOSIS — C787 Secondary malignant neoplasm of liver and intrahepatic bile duct: Secondary | ICD-10-CM | POA: Diagnosis not present

## 2016-10-24 DIAGNOSIS — Z923 Personal history of irradiation: Secondary | ICD-10-CM | POA: Diagnosis not present

## 2016-10-24 DIAGNOSIS — M19071 Primary osteoarthritis, right ankle and foot: Secondary | ICD-10-CM | POA: Diagnosis not present

## 2016-10-24 DIAGNOSIS — F419 Anxiety disorder, unspecified: Secondary | ICD-10-CM | POA: Diagnosis not present

## 2016-10-24 DIAGNOSIS — Z79899 Other long term (current) drug therapy: Secondary | ICD-10-CM | POA: Diagnosis not present

## 2016-10-24 DIAGNOSIS — K219 Gastro-esophageal reflux disease without esophagitis: Secondary | ICD-10-CM | POA: Diagnosis not present

## 2016-10-24 DIAGNOSIS — M25559 Pain in unspecified hip: Secondary | ICD-10-CM | POA: Diagnosis not present

## 2016-10-24 MED ORDER — MORPHINE SULFATE ER 60 MG PO TBCR
180.0000 mg | EXTENDED_RELEASE_TABLET | Freq: Two times a day (BID) | ORAL | 0 refills | Status: DC
Start: 2016-10-24 — End: 2016-10-25

## 2016-10-24 MED ORDER — HYDROMORPHONE HCL 4 MG/ML IJ SOLN
0.5000 mg | Freq: Once | INTRAMUSCULAR | Status: DC
Start: 2016-10-24 — End: 2016-10-24
  Administered 2016-10-24: 1 mg via INTRAMUSCULAR

## 2016-10-24 MED ORDER — OXYCODONE HCL 30 MG PO TABS: 30.0000 mg | ORAL_TABLET | ORAL | 0 refills | Status: DC | PRN

## 2016-10-24 NOTE — Progress Notes (Addendum)
Patient arrived  At 1215pm via [facility] ambulance on stretcher,assisted and moved patien to our stretcher , patient crying, "i need to talk with Dr. Ronnald Collum h is here for rd tx , he did get his MS Contin 180mg  oral at 1100am  and oxycodone 30mg  IR 9am  before transporting here, took vitals, wnl, applied warm blankets, offered gingerale, swallowed without difficulty, patient lying quietly now on hi right side, breathing is better ,eyes closed, moans occasionally,waiting on Dr, Lisbeth Renshaw to se patient his radiation time is still 1pm 12:33 PM

## 2016-10-24 NOTE — Telephone Encounter (Signed)
Called dispatcher of [facility], [phone] can pick patient up at 2pm at cancer center in radiation dept. To go back to [facility] dipatcher acknowledged and will send transportation then 1:40 PM

## 2016-10-24 NOTE — Telephone Encounter (Signed)
error 

## 2016-10-24 NOTE — Progress Notes (Addendum)
Called  [facility] spoke with [name] RN gave updated statsu patient was given 0.5mg  of a 1mg  dilaudid left deltoid to the patient tfor apin , vitals wnl, voided 100 cc dark urine and changed the patient he had also voided on himself and will be ready to go back by 12pm, [facility] has been called 1:58 PM

## 2016-10-24 NOTE — Progress Notes (Addendum)
Patient gave name and dob, per Dr. Lisbeth Renshaw verbal Dilaudid 0.5mg /per 1mg  vial  IM x1  And given in  left deltoid 1:18 PM ,patient voided with urinal 100cc , amber ark , and voided in bed, cleansed patient and new sheets and depends placed onpatient, patientmoaning and groinging Oh Lord, pain 10/10 again form movement, encouraged slow breathing, and gave more gingerale , no swallowing difficulties , called lianc #3, patient is ready when you can get hm, spoke with [place] RT Therapist who is treating another patient, patient encouraged to slow breath and let the dilaudid work 1:21 PM  1:20 PM

## 2016-10-24 NOTE — Telephone Encounter (Signed)
Called [facility] spoke with RN [name], asked to have patient here at cancer center by 1230pm for radiation and to please medicate patient prior to tr Rn stated she would if she gets pain meds ordered in time 8:55 AM transporting patient to the [place] next to [place], [name]

## 2016-10-25 ENCOUNTER — Non-Acute Institutional Stay (SKILLED_NURSING_FACILITY): Payer: Medicaid Other | Admitting: Adult Health

## 2016-10-25 ENCOUNTER — Emergency Department (HOSPITAL_COMMUNITY)
Admission: EM | Admit: 2016-10-25 | Discharge: 2016-10-25 | Disposition: A | Discharge status: Home / Self Care | Payer: Medicaid Other | Attending: Emergency Medicine | Admitting: Emergency Medicine

## 2016-10-25 ENCOUNTER — Encounter (HOSPITAL_COMMUNITY): Payer: Self-pay | Admitting: Emergency Medicine

## 2016-10-25 ENCOUNTER — Other Ambulatory Visit: Payer: Self-pay

## 2016-10-25 ENCOUNTER — Encounter: Payer: Self-pay | Admitting: Adult Health

## 2016-10-25 ENCOUNTER — Telehealth: Payer: Self-pay | Admitting: *Deleted

## 2016-10-25 ENCOUNTER — Ambulatory Visit
Admission: RE | Admit: 2016-10-25 | Discharge: 2016-10-25 | Disposition: A | Discharge status: Home / Self Care | Payer: Medicaid Other | Source: Ambulatory Visit | Attending: Radiation Oncology | Admitting: Radiation Oncology

## 2016-10-25 ENCOUNTER — Ambulatory Visit: Payer: Medicaid Other

## 2016-10-25 DIAGNOSIS — K219 Gastro-esophageal reflux disease without esophagitis: Secondary | ICD-10-CM | POA: Insufficient documentation

## 2016-10-25 DIAGNOSIS — N390 Urinary tract infection, site not specified: Secondary | ICD-10-CM | POA: Diagnosis not present

## 2016-10-25 DIAGNOSIS — C7951 Secondary malignant neoplasm of bone: Secondary | ICD-10-CM | POA: Diagnosis not present

## 2016-10-25 DIAGNOSIS — C3412 Malignant neoplasm of upper lobe, left bronchus or lung: Secondary | ICD-10-CM | POA: Diagnosis not present

## 2016-10-25 DIAGNOSIS — E039 Hypothyroidism, unspecified: Secondary | ICD-10-CM | POA: Diagnosis not present

## 2016-10-25 DIAGNOSIS — R339 Retention of urine, unspecified: Secondary | ICD-10-CM | POA: Diagnosis present

## 2016-10-25 DIAGNOSIS — T839XXA Unspecified complication of genitourinary prosthetic device, implant and graft, initial encounter: Secondary | ICD-10-CM | POA: Diagnosis not present

## 2016-10-25 DIAGNOSIS — K5903 Drug induced constipation: Secondary | ICD-10-CM | POA: Insufficient documentation

## 2016-10-25 DIAGNOSIS — T402X5A Adverse effect of other opioids, initial encounter: Secondary | ICD-10-CM

## 2016-10-25 DIAGNOSIS — Y732 Prosthetic and other implants, materials and accessory gastroenterology and urology devices associated with adverse incidents: Secondary | ICD-10-CM | POA: Insufficient documentation

## 2016-10-25 DIAGNOSIS — I10 Essential (primary) hypertension: Secondary | ICD-10-CM | POA: Diagnosis not present

## 2016-10-25 DIAGNOSIS — R1084 Generalized abdominal pain: Secondary | ICD-10-CM

## 2016-10-25 DIAGNOSIS — C189 Malignant neoplasm of colon, unspecified: Secondary | ICD-10-CM | POA: Diagnosis not present

## 2016-10-25 DIAGNOSIS — R319 Hematuria, unspecified: Secondary | ICD-10-CM | POA: Insufficient documentation

## 2016-10-25 DIAGNOSIS — Z510 Encounter for antineoplastic radiation therapy: Secondary | ICD-10-CM | POA: Diagnosis not present

## 2016-10-25 MED ORDER — MORPHINE SULFATE ER 60 MG PO TBCR: 60.0000 mg | EXTENDED_RELEASE_TABLET | Freq: Two times a day (BID) | ORAL | 0 refills | Status: DC

## 2016-10-25 MED ORDER — LEVOFLOXACIN 750 MG PO TABS: 750.0000 mg | ORAL_TABLET | Freq: Every day | ORAL | 0 refills | Status: DC

## 2016-10-25 MED ORDER — LEVOFLOXACIN 750 MG PO TABS
750.0000 mg | ORAL_TABLET | Freq: Once | ORAL | Status: AC
  Administered 2016-10-25: 750 mg via ORAL
  Filled 2016-10-25: qty 1

## 2016-10-25 MED ORDER — HYDROMORPHONE HCL 1 MG/ML IJ SOLN
0.5000 mg | Freq: Once | INTRAMUSCULAR | Status: AC
  Administered 2016-10-25: 0.5 mg via INTRAMUSCULAR
  Filled 2016-10-25: qty 0.5

## 2016-10-25 MED ORDER — OXYCODONE HCL 30 MG PO TABS: 30.0000 mg | ORAL_TABLET | ORAL | 0 refills | Status: DC | PRN

## 2016-10-25 NOTE — Consult Note (Signed)
I was consult place a Foley catheter. This was done using cystoscopy. An 59 French Foley catheter was then placed over a wire.  The findings included a false passage in the bulbar urethra in 2 separate locations. The false passage was quite large, consistent with a full balloon being inflated within the false passage. The urine returned was turbid, the patient likely has a urinary tract infection.  Recommendations: The patient should have a urine culture sent from the catheter bag while in the emergency department. He should be started on empiric oral antibiotics, broad-spectrum. The catheter should stay for 2 weeks given the extent of the false passage. A voiding trial can be done at the nursing facility.

## 2016-10-25 NOTE — Telephone Encounter (Signed)
RX faxed to [facility] fax # [phone] Phone #[phone]- [phone] 

## 2016-10-25 NOTE — ED Triage Notes (Signed)
Per EMS pt coming from [place] facility. Pt complaining of blood in catheter that was placed yesterday. Pt states he has not been able to release urine since catheter placement and is having severe abdominal pain. Abdomen is distended with pain and tenderness throughout. Pt A&O x 4.
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
## 2016-10-25 NOTE — Telephone Encounter (Signed)
Prescription request was received from:  [facility]  [address] [place] [address]  PHONE: [phone]   Fax: [phone]

## 2016-10-25 NOTE — ED Notes (Signed)
Bladder scan showing greater than 759mL present

## 2016-10-25 NOTE — Discharge Instructions (Signed)
1. Medications: Levaquin, usual home medications 2. Treatment: rest, drink plenty of fluids, please complete the entire course of antibiotics; keep the foley catheter in place x 2 weeks 3. Follow Up: Please followup with your urology as needed for discussion of your diagnoses and further evaluation after today's visit; if you do not have a primary care doctor use the resource guide provided to find one; Please return to the ER for fevers, abd pain, vomiting or other concerns

## 2016-10-25 NOTE — ED Notes (Signed)
Bed: GH82 Expected date:  Expected time:  Means of arrival:  Comments: 66 yr old blood in catheter
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
## 2016-10-25 NOTE — Telephone Encounter (Signed)
Called [facility] [phone] and spoke with RN [name], patient is back from ED and has a new foley cathter that was placed by Urologist, on Levaquin, and faxed over new schedule for today and tomorrow time changes  Pleas have patient arrive at  2:30pm today and 12:15pm tomorrow , [name] stated she would call for transportation and medicate patient before coming for his radiation  Edgar Ross 9:14 AM

## 2016-10-25 NOTE — Procedures (Signed)
Preoperative diagnosis:  1. Acute urinary retention   Postoperative diagnosis:  1. Same   Procedure: 1. Cystourethroscopy with placement of Foley catheter, difficult  Surgeon: Ardis Hughs, MD  Anesthesia: General  Complications: None  Intraoperative findings: The patient had a false passage in the bulbar urethra with clot surrounding the false passage and a very narrow lumen.  EBL: Minimal  Specimens: None  Indication: Edgar Ross is a 67 y.o. patient with history of metastatic colon cancer who developed retention in a nursing facility today. The nursing staff pass a Foley catheter and inflated the balloon within the false passage. Noting no significant urine output was transferred to the ER for further evaluation. In the ER again, the catheter was unsuccessfully placed. Description of procedure:  The patient was placed in the supine position, prepped and draped in the usual sterile fashion.  A 16 French flexible cystoscope was then gently passed through the patient's urethra under visual guidance. Once into the bulbar urethra noting a significant false passage, possibly 2 separate false passages, I was able to navigate the native urethra and then into the bladder appeared once in the bladder and the bladder was noted to be extremely distended with dark colored urine. I passed a 0.038 sensor wire through the scope and into the patient's bladder removing the scope over the wire. I then gently was able to advance an 67 French Foley catheter over the wire into the patient's bladder. Straw-colored urine was noted to return. A Foley catheter bag was then attached to the catheter. 10 mL of sterile water was used to inflate the catheter balloon. The patient tolerated the procedure quite well.  Approximately 1200 mL of straw/dark colored urine was quickly drained from the patient's bladder with almost immediate relief from the patient.  Ardis Hughs, M.D.

## 2016-10-26 ENCOUNTER — Telehealth: Payer: Self-pay | Admitting: *Deleted

## 2016-10-26 ENCOUNTER — Ambulatory Visit
Admission: RE | Admit: 2016-10-26 | Discharge: 2016-10-26 | Disposition: A | Discharge status: Home / Self Care | Payer: Medicaid Other | Source: Ambulatory Visit | Attending: Radiation Oncology | Admitting: Radiation Oncology

## 2016-10-26 ENCOUNTER — Encounter: Payer: Self-pay | Admitting: Internal Medicine

## 2016-10-26 ENCOUNTER — Non-Acute Institutional Stay (SKILLED_NURSING_FACILITY): Payer: Medicaid Other | Admitting: Internal Medicine

## 2016-10-26 ENCOUNTER — Ambulatory Visit: Payer: Medicaid Other

## 2016-10-26 DIAGNOSIS — E039 Hypothyroidism, unspecified: Secondary | ICD-10-CM | POA: Diagnosis not present

## 2016-10-26 DIAGNOSIS — G893 Neoplasm related pain (acute) (chronic): Secondary | ICD-10-CM

## 2016-10-26 DIAGNOSIS — I10 Essential (primary) hypertension: Secondary | ICD-10-CM | POA: Diagnosis not present

## 2016-10-26 DIAGNOSIS — C189 Malignant neoplasm of colon, unspecified: Secondary | ICD-10-CM

## 2016-10-26 DIAGNOSIS — N400 Enlarged prostate without lower urinary tract symptoms: Secondary | ICD-10-CM

## 2016-10-26 DIAGNOSIS — C7951 Secondary malignant neoplasm of bone: Secondary | ICD-10-CM

## 2016-10-26 DIAGNOSIS — R29898 Other symptoms and signs involving the musculoskeletal system: Secondary | ICD-10-CM

## 2016-10-26 DIAGNOSIS — T402X5A Adverse effect of other opioids, initial encounter: Secondary | ICD-10-CM

## 2016-10-26 DIAGNOSIS — K5903 Drug induced constipation: Secondary | ICD-10-CM

## 2016-10-26 DIAGNOSIS — F419 Anxiety disorder, unspecified: Secondary | ICD-10-CM | POA: Diagnosis not present

## 2016-10-26 DIAGNOSIS — R339 Retention of urine, unspecified: Secondary | ICD-10-CM

## 2016-10-26 DIAGNOSIS — Z85118 Personal history of other malignant neoplasm of bronchus and lung: Secondary | ICD-10-CM

## 2016-10-26 DIAGNOSIS — Z510 Encounter for antineoplastic radiation therapy: Secondary | ICD-10-CM | POA: Diagnosis not present

## 2016-10-26 LAB — CBC AND DIFFERENTIAL
HCT: 38 — AB (ref 41–53)
Hemoglobin: 12.4 — AB (ref 13.5–17.5)
NEUTROS ABS: 17
PLATELETS: 135 — AB (ref 150–399)
WBC: 18.4

## 2016-10-26 NOTE — Progress Notes (Signed)
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  Check CBC to follow steroid induced leukocytosis. Will need TSH as no recent level in EPIC (8.083 taken 3 yrs ago)  F/u with Rad Onc to complete XRT  F/u with H/O as scheduled. Plan for chemotx per H/O  F/u with urology in 2 weeks if fail voiding trial  Urine cx (+) Coag neg staph >100K colonies. May need to change abx to doxy vs macrobid due to cipro resistance  PT/OT/ST as ordered  May need palliative care eval  GOAL: short term rehab and d/c home when medically appropriate. Communicated with pt and nursing.  Will follow  Anberlin Diez S. CPerlie Gold [facility] [address] [phone] (Monday-Friday 8 AM - 5 PM) ([phone] 5 PM and follow prompts

## 2016-10-26 NOTE — Telephone Encounter (Addendum)
Maryruth Bun  [phone] spoke with nurse Sonia Side, asked if patient was on his way For radiation faxed schedule , she wasn't aware of patient coming for radaition, even thought I had faxed yesterday morning his appt times, will refax again today, she will call back when patient will be transported here ,informed Miranda RT Therapist 1:03 PM

## 2016-10-27 ENCOUNTER — Ambulatory Visit: Payer: Medicaid Other

## 2016-10-27 ENCOUNTER — Ambulatory Visit
Admission: RE | Admit: 2016-10-27 | Discharge: 2016-10-27 | Disposition: A | Discharge status: Home / Self Care | Payer: Medicaid Other | Source: Ambulatory Visit | Attending: Radiation Oncology | Admitting: Radiation Oncology

## 2016-10-27 DIAGNOSIS — F419 Anxiety disorder, unspecified: Secondary | ICD-10-CM | POA: Insufficient documentation

## 2016-10-27 DIAGNOSIS — G893 Neoplasm related pain (acute) (chronic): Secondary | ICD-10-CM | POA: Insufficient documentation

## 2016-10-27 DIAGNOSIS — Z510 Encounter for antineoplastic radiation therapy: Secondary | ICD-10-CM | POA: Diagnosis not present

## 2016-10-27 DIAGNOSIS — Z85118 Personal history of other malignant neoplasm of bronchus and lung: Secondary | ICD-10-CM | POA: Insufficient documentation

## 2016-10-27 DIAGNOSIS — N400 Enlarged prostate without lower urinary tract symptoms: Secondary | ICD-10-CM | POA: Insufficient documentation

## 2016-10-27 DIAGNOSIS — R339 Retention of urine, unspecified: Secondary | ICD-10-CM | POA: Insufficient documentation

## 2016-10-27 LAB — URINE CULTURE: Culture: >=100000 — AB

## 2016-10-28 ENCOUNTER — Telehealth: Payer: Self-pay

## 2016-10-28 ENCOUNTER — Telehealth: Payer: Self-pay | Admitting: Oncology

## 2016-10-28 NOTE — Telephone Encounter (Signed)
sw pt to confirm 7/6 appt at 1230 per sch msg

## 2016-10-28 NOTE — Telephone Encounter (Signed)
Labs faxed to [place] at [phone] with need for ABX

## 2016-10-28 NOTE — Progress Notes (Signed)
ED Antimicrobial Stewardship Positive Culture Follow Up   Edgar Ross is an 66 y.o. male who presented to [facility] on 10/25/2016 with a chief complaint of  Chief Complaint  Patient presents with  . Abdominal Pain    blood in catheter    Recent Results (from the past 720 hour(s))  Culture, Urine     Status: Abnormal   Collection Time: 10/25/16  7:24 AM  Result Value Ref Range Status   Specimen Description URINE, CATHETERIZED  Final   Special Requests NONE  Final   Culture (A)  Final    >=100,000 COLONIES/mL STAPHYLOCOCCUS SPECIES (COAGULASE NEGATIVE)   Report Status 10/27/2016 FINAL  Final   Organism ID, Bacteria STAPHYLOCOCCUS SPECIES (COAGULASE NEGATIVE) (A)  Final      Susceptibility   Staphylococcus species (coagulase negative) - MIC*    CIPROFLOXACIN >=8 RESISTANT Resistant     GENTAMICIN <=0.5 SENSITIVE Sensitive     NITROFURANTOIN <=16 SENSITIVE Sensitive     OXACILLIN <=0.25 SENSITIVE Sensitive     TETRACYCLINE 2 SENSITIVE Sensitive     VANCOMYCIN 2 SENSITIVE Sensitive     TRIMETH/SULFA >=320 RESISTANT Resistant     CLINDAMYCIN >=8 RESISTANT Resistant     RIFAMPIN <=0.5 SENSITIVE Sensitive     Inducible Clindamycin NEGATIVE Sensitive     * >=100,000 COLONIES/mL STAPHYLOCOCCUS SPECIES (COAGULASE NEGATIVE)    [x]  Treated with levaquin, organism resistant to prescribed antimicrobial []  Patient discharged originally without antimicrobial agent and treatment is now indicated  New antibiotic prescription: DC levaquin, start keflex 500mg  PO BID x 7 days  ED Provider: Providence Lanius, PA   Macen Joslin, Rande Lawman 10/28/2016, 9:45 AM Clinical Pharmacist Phone# [phone]

## 2016-10-30 ENCOUNTER — Ambulatory Visit
Admission: RE | Admit: 2016-10-30 | Discharge: 2016-10-30 | Disposition: A | Discharge status: Home / Self Care | Payer: Medicaid Other | Source: Ambulatory Visit | Attending: Radiation Oncology | Admitting: Radiation Oncology

## 2016-10-30 DIAGNOSIS — Z510 Encounter for antineoplastic radiation therapy: Secondary | ICD-10-CM | POA: Diagnosis not present

## 2016-10-31 ENCOUNTER — Ambulatory Visit
Admission: RE | Admit: 2016-10-31 | Discharge: 2016-10-31 | Disposition: A | Discharge status: Home / Self Care | Payer: Medicaid Other | Source: Ambulatory Visit | Attending: Radiation Oncology | Admitting: Radiation Oncology

## 2016-10-31 DIAGNOSIS — Z510 Encounter for antineoplastic radiation therapy: Secondary | ICD-10-CM | POA: Diagnosis not present

## 2016-11-02 ENCOUNTER — Ambulatory Visit: Payer: Medicaid Other

## 2016-11-02 ENCOUNTER — Ambulatory Visit
Admission: RE | Admit: 2016-11-02 | Discharge: 2016-11-02 | Disposition: A | Discharge status: Home / Self Care | Payer: Medicaid Other | Source: Ambulatory Visit | Attending: Radiation Oncology | Admitting: Radiation Oncology

## 2016-11-02 ENCOUNTER — Ambulatory Visit: Payer: Medicaid Other | Admitting: Radiation Oncology

## 2016-11-02 ENCOUNTER — Telehealth: Payer: Self-pay | Admitting: *Deleted

## 2016-11-02 ENCOUNTER — Encounter: Payer: Self-pay | Admitting: *Deleted

## 2016-11-02 DIAGNOSIS — Z510 Encounter for antineoplastic radiation therapy: Secondary | ICD-10-CM | POA: Diagnosis not present

## 2016-11-02 NOTE — Progress Notes (Signed)
  Oncology Nurse Navigator Documentation  Navigator Location: [facility] (11/02/16 1400)   )Navigator Encounter Type: Treatment (11/02/16 1400)     Patient at [facility] for radiation treatments to his T11 - L1 lesions.  Patient appears more comfortable than at last visits.  He reports that he feels a little improvement in his pain.  He continues to be at [employer].  He is scheduled for [facility] tomorrow for treatment to the upper thoracic spine.                Patient Visit Type: RadOnc (11/02/16 1400) Treatment Phase: Active Tx (11/02/16 1400) Barriers/Navigation Needs: No barriers at this time (11/02/16 1400)   Interventions: None required (11/02/16 1400)                      Time Spent with Patient: 15 (11/02/16 1400)

## 2016-11-02 NOTE — Telephone Encounter (Signed)
Called [facility] rehab facility, spoke with Marcie Bal, asked if patient was on his way , his appt was for 1200 pm,  1215 pm rad tx,  She is trying to get the nurse to answer phone, on hold,  12:30 PM

## 2016-11-03 ENCOUNTER — Ambulatory Visit: Payer: Medicaid Other | Admitting: Oncology

## 2016-11-03 ENCOUNTER — Telehealth: Payer: Self-pay | Admitting: *Deleted

## 2016-11-03 ENCOUNTER — Telehealth: Payer: Self-pay

## 2016-11-03 ENCOUNTER — Ambulatory Visit
Admission: RE | Admit: 2016-11-03 | Discharge: 2016-11-03 | Disposition: A | Discharge status: Home / Self Care | Payer: Medicaid Other | Source: Ambulatory Visit | Attending: Radiation Oncology | Admitting: Radiation Oncology

## 2016-11-03 DIAGNOSIS — Z510 Encounter for antineoplastic radiation therapy: Secondary | ICD-10-CM | POA: Diagnosis not present

## 2016-11-03 NOTE — Telephone Encounter (Signed)
Called [employer], asked if they had the patient in route, his appt time today was fro 1215  pm, "No, we had 3p m for today"stated male dispatcher couldn't get his name, [facility] was given patient's schedule yesterday by Alm Bustard RT therapist, he will send for the patient now, called lianc#3 again gave information update to [place] RT therp[aist 12:35 PM

## 2016-11-03 NOTE — Telephone Encounter (Signed)
Called pt 2x today to confirm he has updated schedule of appts. No answer

## 2016-11-06 ENCOUNTER — Telehealth: Payer: Self-pay

## 2016-11-06 ENCOUNTER — Ambulatory Visit: Payer: Medicaid Other | Admitting: Radiation Oncology

## 2016-11-06 NOTE — Telephone Encounter (Signed)
Call placed and confirmed with pt's mother his appt with MD Benay Spice on 7/11. Pt's mother verbalized understanding.

## 2016-11-07 ENCOUNTER — Telehealth: Payer: Self-pay | Admitting: *Deleted

## 2016-11-07 ENCOUNTER — Ambulatory Visit
Admission: RE | Admit: 2016-11-07 | Discharge: 2016-11-07 | Disposition: A | Discharge status: Home / Self Care | Payer: Medicaid Other | Source: Ambulatory Visit | Attending: Radiation Oncology | Admitting: Radiation Oncology

## 2016-11-07 DIAGNOSIS — Z510 Encounter for antineoplastic radiation therapy: Secondary | ICD-10-CM | POA: Diagnosis not present

## 2016-11-07 NOTE — Telephone Encounter (Signed)
Called [facility] rehab facility to make sure patient is here by 2pm for his rad tx, no answr, let phone ring 25x, will try again after 9am 8:25 AM

## 2016-11-07 NOTE — Telephone Encounter (Signed)
Spoke with Medical laboratory scientific officer, asked that patient arrive from [place] via [employer] transportation no later than 2:15pm today, tomorrow he has appt upstairs with Dr. Benay Spice at 1100am, she only had appt time today for 235 per schedule faxed to them 11/03/16, bu I had written to arrive 15-20 minutes before scheduled times on the sheet, she will call [employer] now and try to get patient her no later than 2:15pm, thanked [employer]

## 2016-11-08 ENCOUNTER — Ambulatory Visit (HOSPITAL_BASED_OUTPATIENT_CLINIC_OR_DEPARTMENT_OTHER): Payer: Medicaid Other | Admitting: Oncology

## 2016-11-08 ENCOUNTER — Ambulatory Visit
Admission: RE | Admit: 2016-11-08 | Discharge: 2016-11-08 | Disposition: A | Discharge status: Home / Self Care | Payer: Medicaid Other | Source: Ambulatory Visit | Attending: Radiation Oncology | Admitting: Radiation Oncology

## 2016-11-08 VITALS — BP 115/76 | HR 104 | Temp 98.5°F | Resp 18 | Ht 76.0 in

## 2016-11-08 DIAGNOSIS — C7951 Secondary malignant neoplasm of bone: Secondary | ICD-10-CM | POA: Diagnosis not present

## 2016-11-08 DIAGNOSIS — G893 Neoplasm related pain (acute) (chronic): Secondary | ICD-10-CM | POA: Diagnosis not present

## 2016-11-08 DIAGNOSIS — Z85118 Personal history of other malignant neoplasm of bronchus and lung: Secondary | ICD-10-CM

## 2016-11-08 DIAGNOSIS — C189 Malignant neoplasm of colon, unspecified: Secondary | ICD-10-CM | POA: Diagnosis present

## 2016-11-08 DIAGNOSIS — C787 Secondary malignant neoplasm of liver and intrahepatic bile duct: Secondary | ICD-10-CM | POA: Diagnosis not present

## 2016-11-08 DIAGNOSIS — R339 Retention of urine, unspecified: Secondary | ICD-10-CM | POA: Diagnosis not present

## 2016-11-08 DIAGNOSIS — Z510 Encounter for antineoplastic radiation therapy: Secondary | ICD-10-CM | POA: Diagnosis not present

## 2016-11-08 NOTE — Progress Notes (Signed)
[place] OFFICE PROGRESS NOTE   Diagnosis: Colon cancer  INTERVAL HISTORY:   Edgar Ross was discharged from the hospital 10/20/2016 after a prolonged admission with back pain. He continues a course of palliative radiation to the spine. He is now living in a skilled nursing facility.  I last saw him while he was in the hospital. He has been evaluated in the emergency room for pain and by radiation oncology over the past few weeks. He continues palliative radiation to the spine.  He presents today for a scheduled visit. He notes improvement in the back pain. He was noted to have bilateral lower extremity paralysis on admission to the skilled nursing facility 10/26/2016. He was also noted to have urinary retention 10/26/2016. A Foley catheter was placed. Edgar Ross reports the leg paralysis has been present for "a while ".  Objective:  Vital signs in last 24 hours:  Blood pressure 115/76, pulse (!) 104, temperature 98.5 F (36.9 C), temperature source Oral, resp. rate 18, height 6' 4"  (1.93 m), SpO2 100 %.     Resp: Lungs clear bilaterally Cardio: Regular rate and rhythm GI: Nontender, no hepatomegaly Vascular: No leg edema Neuro: Alert, unable to move either leg or foot. Diminished sensation at the legs and feet.     Lab Results:  Lab Results  Component Value Date   WBC 18.4 10/26/2016   HGB 12.4 (A) 10/26/2016   HCT 38 (A) 10/26/2016   MCV 86.1 10/20/2016   PLT 135 (A) 10/26/2016   NEUTROABS 17 10/26/2016    Medications: I have reviewed the patient's current medications.  Assessment/Plan: 1. Colon cancer, status post a polypectomy in 2017 revealing a focus of adenocarcinoma ? Back pain with tumor involving L3, status post an L3 corpectomy 05/28/2016 confirming metastatic adenocarcinoma consistent with a colon primary (cytokeratin 20 and CDX2positive), comparison of the pathology to the 2017 polypectomy showed similar morphology; K-ras mutation  detected;no evidence of DNA mismatch repair deficiency; intact tumor nuclear staining for MLH1, MSH2, MSH6 and PMS2 ? He completed postoperative radiation to the lumbar spine, 3000 cGyin 10 fractions, 06/26/2016-07/10/2016 ? PET scan 06/19/2016 revealed multiple hypermetabolic bone metastases, hypermetabolic liver metastases, indeterminate mildlyhypermetabolic axillary lymph nodes ? Status post palliative radiation to the lumbar spine ? CTs 09/05/2016-diffuse liver metastases. Lytic bone metastases in the thoracic/lumbar spine and left ilium. ? CT lumbar spine 10/09/2016-metastatic disease at multiple levels of the thoracic and lumbar spine including the posterior elements at L1, T11, and the left iliac  2. Non-small cell lung cancer, adenocarcinoma, T2 N0, ALK positive, status post a left upper lobectomy August 2014  3. Pain secondary to #1-under better control with MS Contin  4. History of a thyroidectomy  5. Colonoscopy 07/07/2016-benign polypoid lesion the sigmoid colon-biopsied, diminutive polyps in the rectum, tattoo in the sigmoid colon-site appeared normal  6.  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 He is completing a course of palliative radiation to the spine. He now has a cord compression syndrome with urinary retention and lower extremity paralysis. It appears the paralysis has been present for greater than a week. He does not appear to be a candidate for surgical intervention. He is already maintained on Decadron.  I discussed the likelihood of a cord compression syndrome with Edgar Ross. He understands it is unlikely the paralysis will improve. I discussed the case with Dr. Lisbeth Renshaw.  I recommend Hospice  care. We will communicate with Dr. Eulas Post. Edgar Ross will be scheduled for a follow-up appointment in 3-4 weeks.    Donneta Romberg, MD  11/08/2016    5:11 PM

## 2016-11-09 ENCOUNTER — Ambulatory Visit
Admission: RE | Admit: 2016-11-09 | Discharge: 2016-11-09 | Disposition: A | Discharge status: Home / Self Care | Payer: Medicaid Other | Source: Ambulatory Visit | Attending: Radiation Oncology | Admitting: Radiation Oncology

## 2016-11-09 ENCOUNTER — Non-Acute Institutional Stay (SKILLED_NURSING_FACILITY): Payer: Medicaid Other | Admitting: Adult Health

## 2016-11-09 ENCOUNTER — Encounter: Payer: Self-pay | Admitting: Adult Health

## 2016-11-09 DIAGNOSIS — C7951 Secondary malignant neoplasm of bone: Secondary | ICD-10-CM

## 2016-11-09 DIAGNOSIS — C3412 Malignant neoplasm of upper lobe, left bronchus or lung: Secondary | ICD-10-CM

## 2016-11-09 DIAGNOSIS — F411 Generalized anxiety disorder: Secondary | ICD-10-CM | POA: Diagnosis not present

## 2016-11-09 DIAGNOSIS — Z510 Encounter for antineoplastic radiation therapy: Secondary | ICD-10-CM | POA: Diagnosis not present

## 2016-11-09 DIAGNOSIS — C189 Malignant neoplasm of colon, unspecified: Secondary | ICD-10-CM
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
## 2016-11-10 ENCOUNTER — Ambulatory Visit
Admission: RE | Admit: 2016-11-10 | Discharge: 2016-11-10 | Disposition: A | Discharge status: Home / Self Care | Payer: Medicaid Other | Source: Ambulatory Visit | Attending: Radiation Oncology | Admitting: Radiation Oncology

## 2016-11-10 DIAGNOSIS — Z510 Encounter for antineoplastic radiation therapy: Secondary | ICD-10-CM | POA: Diagnosis not present

## 2016-11-13 ENCOUNTER — Ambulatory Visit
Admission: RE | Admit: 2016-11-13 | Discharge: 2016-11-13 | Disposition: A | Discharge status: Home / Self Care | Payer: Medicaid Other | Source: Ambulatory Visit | Attending: Radiation Oncology | Admitting: Radiation Oncology

## 2016-11-13 DIAGNOSIS — Z510 Encounter for antineoplastic radiation therapy: Secondary | ICD-10-CM | POA: Diagnosis not present

## 2016-11-14 ENCOUNTER — Ambulatory Visit
Admission: RE | Admit: 2016-11-14 | Discharge: 2016-11-14 | Disposition: A | Discharge status: Home / Self Care | Payer: Medicaid Other | Source: Ambulatory Visit | Attending: Radiation Oncology | Admitting: Radiation Oncology

## 2016-11-14 ENCOUNTER — Telehealth: Payer: Self-pay | Admitting: *Deleted

## 2016-11-14 DIAGNOSIS — Z510 Encounter for antineoplastic radiation therapy: Secondary | ICD-10-CM | POA: Diagnosis not present

## 2016-11-14 NOTE — Telephone Encounter (Signed)
Spoke with Bard Herbert, NP at [facility]. They are aware of hospice recommendation. She will follow up with hospice to determine whether they will admit him prior to completing palliative radiation.

## 2016-11-15 ENCOUNTER — Ambulatory Visit
Admission: RE | Admit: 2016-11-15 | Discharge: 2016-11-15 | Disposition: A | Discharge status: Home / Self Care | Payer: Medicaid Other | Source: Ambulatory Visit | Attending: Radiation Oncology | Admitting: Radiation Oncology

## 2016-11-15 ENCOUNTER — Non-Acute Institutional Stay (SKILLED_NURSING_FACILITY): Payer: Medicaid Other | Admitting: Adult Health

## 2016-11-15 ENCOUNTER — Encounter: Payer: Self-pay | Admitting: Adult Health

## 2016-11-15 DIAGNOSIS — C3412 Malignant neoplasm of upper lobe, left bronchus or lung: Secondary | ICD-10-CM | POA: Diagnosis not present

## 2016-11-15 DIAGNOSIS — C189 Malignant neoplasm of colon, unspecified: Secondary | ICD-10-CM | POA: Diagnosis not present

## 2016-11-15 DIAGNOSIS — C7951 Secondary malignant neoplasm of bone: Secondary | ICD-10-CM

## 2016-11-15 DIAGNOSIS — Z510 Encounter for antineoplastic radiation therapy: Secondary | ICD-10-CM | POA: Diagnosis not present
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
## 2016-11-16 ENCOUNTER — Ambulatory Visit
Admission: RE | Admit: 2016-11-16 | Discharge: 2016-11-16 | Disposition: A | Discharge status: Home / Self Care | Payer: Medicaid Other | Source: Ambulatory Visit | Attending: Radiation Oncology | Admitting: Radiation Oncology

## 2016-11-16 DIAGNOSIS — Z510 Encounter for antineoplastic radiation therapy: Secondary | ICD-10-CM | POA: Diagnosis not present

## 2016-11-17 ENCOUNTER — Ambulatory Visit
Admission: RE | Admit: 2016-11-17 | Discharge: 2016-11-17 | Disposition: A | Discharge status: Home / Self Care | Payer: Medicaid Other | Source: Ambulatory Visit | Attending: Radiation Oncology | Admitting: Radiation Oncology

## 2016-11-17 DIAGNOSIS — Z510 Encounter for antineoplastic radiation therapy: Secondary | ICD-10-CM | POA: Diagnosis not present

## 2016-11-20 ENCOUNTER — Ambulatory Visit
Admission: RE | Admit: 2016-11-20 | Discharge: 2016-11-20 | Disposition: A | Discharge status: Home / Self Care | Payer: Medicaid Other | Source: Ambulatory Visit | Attending: Radiation Oncology | Admitting: Radiation Oncology

## 2016-11-20 ENCOUNTER — Encounter: Payer: Self-pay | Admitting: Radiation Oncology

## 2016-11-20 DIAGNOSIS — Z510 Encounter for antineoplastic radiation therapy: Secondary | ICD-10-CM | POA: Diagnosis not present

## 2016-11-21 ENCOUNTER — Non-Acute Institutional Stay (SKILLED_NURSING_FACILITY): Payer: Medicaid Other | Admitting: Adult Health

## 2016-11-21 ENCOUNTER — Encounter: Payer: Self-pay | Admitting: Adult Health

## 2016-11-21 DIAGNOSIS — C3412 Malignant neoplasm of upper lobe, left bronchus or lung: Secondary | ICD-10-CM

## 2016-11-21 DIAGNOSIS — C189 Malignant neoplasm of colon, unspecified: Secondary | ICD-10-CM

## 2016-11-21 DIAGNOSIS — C7951 Secondary malignant neoplasm of bone: Secondary | ICD-10-CM | POA: Diagnosis not present

## 2016-11-21 DIAGNOSIS — E039 Hypothyroidism, unspecified: Secondary | ICD-10-CM | POA: Diagnosis not present

## 2016-11-21 DIAGNOSIS — F411 Generalized anxiety disorder: Secondary | ICD-10-CM | POA: Diagnosis not present

## 2016-11-21 DIAGNOSIS — N400 Enlarged prostate without lower urinary tract symptoms: Secondary | ICD-10-CM
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
## 2016-11-22 NOTE — Progress Notes (Signed)
  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     Indication for treatment:  Palliative       Radiation treatment dates:   10/02/2016 to 11/20/2016  Site/dose:    1. The Spine T3-5 was treated to 30 Gy in 10 fractions at 3 Gy per fraction. 2. The Spine T10-L1 was treated to 30 Gy in 10 fractions at 3 Gy per fraction. 3. The Spine T4 was treated to 3 Gy in 1 fraction. 4. The Left pelvis was treated to 30 Gy in 10 fractions at 3 Gy per fraction.   Beams/energy:    3D conformal/ mixed energy  Narrative: The patient tolerated radiation treatment better as he pro through his courseceeded .   The patient had an interruption in treatment between 6/04 and 6/21 due to hospitalization.   Plan: The patient has completed radiation treatment. The patient will return to radiation oncology clinic for routine followup in one month. I advised them to call or return sooner if they have any questions or concerns related to their recovery or treatment.  ------------------------------------------------  Jodelle Gross, MD, PhD  This document serves as a record of services personally performed by Kyung Rudd, MD. It was created on his behalf by Arlyce Harman, a trained medical scribe. The creation of this record is based on the scribe's personal observations and the provider's statements to them. This document has been checked and approved by the attending provider.

## 2016-11-23 ENCOUNTER — Encounter: Payer: Self-pay | Admitting: Adult Health

## 2016-11-23 ENCOUNTER — Non-Acute Institutional Stay (SKILLED_NURSING_FACILITY): Payer: Medicaid Other | Admitting: Adult Health

## 2016-11-23 ENCOUNTER — Other Ambulatory Visit: Payer: Self-pay

## 2016-11-23 DIAGNOSIS — C3412 Malignant neoplasm of upper lobe, left bronchus or lung: Secondary | ICD-10-CM | POA: Diagnosis not present

## 2016-11-23 DIAGNOSIS — F419 Anxiety disorder, unspecified: Secondary | ICD-10-CM | POA: Insufficient documentation

## 2016-11-23 DIAGNOSIS — C189 Malignant neoplasm of colon, unspecified: Secondary | ICD-10-CM

## 2016-11-23 DIAGNOSIS — C7951 Secondary malignant neoplasm of bone: Secondary | ICD-10-CM

## 2016-11-23 MED ORDER — MORPHINE SULFATE (CONCENTRATE) 20 MG/ML PO SOLN: ORAL | 0 refills | Status: AC
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
## 2016-11-28 ENCOUNTER — Ambulatory Visit: Payer: Medicaid Other | Admitting: Nurse Practitioner

## 2016-11-29 DEATH — deceased

## 2017-04-27 ENCOUNTER — Encounter: Payer: Self-pay | Admitting: Radiation Therapy
